# Patient Record
Sex: Female | Born: 2000 | Race: White | Hispanic: No | Marital: Married | Smoking: Never smoker
Health system: Southern US, Community
[De-identification: ages and names within clinical notes are randomized; demographics above are authoritative.]

## PROBLEM LIST (undated history)

## (undated) DIAGNOSIS — J45909 Unspecified asthma, uncomplicated: Secondary | ICD-10-CM

## (undated) DIAGNOSIS — K219 Gastro-esophageal reflux disease without esophagitis: Secondary | ICD-10-CM

## (undated) HISTORY — PX: TONSILLECTOMY: SUR1361

## (undated) HISTORY — PX: THYROIDECTOMY, PARTIAL: SHX18

## (undated) HISTORY — PX: TYMPANOSTOMY TUBE PLACEMENT: SHX32

## (undated) HISTORY — PX: COLPOSCOPY: SHX161

---
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
## 2018-09-24 ENCOUNTER — Emergency Department: Payer: Managed Care, Other (non HMO)

## 2018-09-24 ENCOUNTER — Emergency Department
Admission: EM | Admit: 2018-09-24 | Discharge: 2018-09-24 | Disposition: A | Discharge status: Home / Self Care | Payer: Managed Care, Other (non HMO) | Attending: Emergency Medicine | Admitting: Emergency Medicine

## 2018-09-24 ENCOUNTER — Other Ambulatory Visit: Payer: Self-pay

## 2018-09-24 ENCOUNTER — Encounter: Payer: Self-pay | Admitting: Emergency Medicine

## 2018-09-24 DIAGNOSIS — O200 Threatened abortion: Secondary | ICD-10-CM | POA: Diagnosis not present

## 2018-09-24 DIAGNOSIS — O26859 Spotting complicating pregnancy, unspecified trimester: Secondary | ICD-10-CM | POA: Diagnosis present

## 2018-09-24 DIAGNOSIS — Z79899 Other long term (current) drug therapy: Secondary | ICD-10-CM | POA: Diagnosis not present

## 2018-09-24 DIAGNOSIS — J45909 Unspecified asthma, uncomplicated: Secondary | ICD-10-CM | POA: Diagnosis not present

## 2018-09-24 DIAGNOSIS — Z3A01 Less than 8 weeks gestation of pregnancy: Secondary | ICD-10-CM | POA: Diagnosis not present

## 2018-09-24 DIAGNOSIS — O209 Hemorrhage in early pregnancy, unspecified: Secondary | ICD-10-CM

## 2018-09-24 LAB — COMPREHENSIVE METABOLIC PANEL
ALT: 17 U/L (ref 0–44)
AST: 19 U/L (ref 15–41)
Albumin: 4.6 g/dL (ref 3.5–5.0)
Alkaline Phosphatase: 41 U/L (ref 38–126)
Anion gap: 8 (ref 5–15)
BUN: 15 mg/dL (ref 6–20)
CO2: 25 mmol/L (ref 22–32)
Calcium: 9.6 mg/dL (ref 8.9–10.3)
Chloride: 106 mmol/L (ref 98–111)
Creatinine, Ser: 0.59 mg/dL (ref 0.44–1.00)
GFR calc Af Amer: >60 mL/min (ref >60)
GFR calc non Af Amer: >60 mL/min (ref >60)
Glucose, Bld: 99 mg/dL (ref 70–99)
Potassium: 3.7 mmol/L (ref 3.5–5.1)
Sodium: 139 mmol/L (ref 135–145)
Total Bilirubin: 0.6 mg/dL (ref 0.3–1.2)
Total Protein: 7.4 g/dL (ref 6.5–8.1)

## 2018-09-24 LAB — WET PREP, GENITAL
Clue Cells Wet Prep HPF POC: NONE SEEN
Sperm: NONE SEEN
Trich, Wet Prep: NONE SEEN
Yeast Wet Prep HPF POC: NONE SEEN

## 2018-09-24 LAB — URINALYSIS, COMPLETE (UACMP) WITH MICROSCOPIC
Bacteria, UA: NONE SEEN
Bilirubin Urine: NEGATIVE
Glucose, UA: NEGATIVE mg/dL
Ketones, ur: NEGATIVE mg/dL
Leukocytes,Ua: NEGATIVE
Nitrite: NEGATIVE
Protein, ur: 30 mg/dL — AB
Specific Gravity, Urine: 1.002 — ABNORMAL LOW (ref 1.005–1.030)
pH: 7 (ref 5.0–8.0)

## 2018-09-24 LAB — CBC WITH DIFFERENTIAL/PLATELET
Abs Immature Granulocytes: 0.02 10*3/uL (ref 0.00–0.07)
Basophils Absolute: 0 10*3/uL (ref 0.0–0.1)
Basophils Relative: 1 %
Eosinophils Absolute: 0.5 10*3/uL (ref 0.0–0.5)
Eosinophils Relative: 6 %
HCT: 42 % (ref 36.0–46.0)
Hemoglobin: 14.4 g/dL (ref 12.0–15.0)
Immature Granulocytes: 0 %
Lymphocytes Relative: 28 %
Lymphs Abs: 2.4 10*3/uL (ref 0.7–4.0)
MCH: 30.5 pg (ref 26.0–34.0)
MCHC: 34.3 g/dL (ref 30.0–36.0)
MCV: 89 fL (ref 80.0–100.0)
Monocytes Absolute: 0.6 10*3/uL (ref 0.1–1.0)
Monocytes Relative: 7 %
Neutro Abs: 5 10*3/uL (ref 1.7–7.7)
Neutrophils Relative %: 58 %
Platelets: 213 10*3/uL (ref 150–400)
RBC: 4.72 MIL/uL (ref 3.87–5.11)
RDW: 11.9 % (ref 11.5–15.5)
WBC: 8.5 10*3/uL (ref 4.0–10.5)
nRBC: 0 % (ref 0.0–0.2)

## 2018-09-24 LAB — CHLAMYDIA/NGC RT PCR (ARMC ONLY): N gonorrhoeae: NOT DETECTED

## 2018-09-24 LAB — CHLAMYDIA/NGC RT PCR (ARMC ONLY)??????????: Chlamydia Tr: NOT DETECTED

## 2018-09-24 LAB — HCG, QUANTITATIVE, PREGNANCY: hCG, Beta Chain, Quant, S: 15 m[IU]/mL — ABNORMAL HIGH (ref <5)

## 2018-09-24 LAB — ABO/RH: ABO/RH(D): A POS

## 2018-09-24 NOTE — ED Provider Notes (Addendum)
[facility] Emergency Department Provider Note   ____________________________________________   First MD Initiated Contact with Patient 09/24/18 1138     (approximate)  I have reviewed the triage vital signs and the nursing notes.   HISTORY  Chief Complaint Vaginal Bleeding    HPI Sara Henson is a 18 y.o. female who reports she is about [redacted] weeks pregnant.  She says she is been having some cramping for the last 2 to 3 days.  Today she began having some bleeding.  Is not very heavy.  She came into the emergency room to be checked.  This is her first pregnancy.  Pain is moderate again crampy.  No other problems.         Past Medical History:  Diagnosis Date  . Asthma   . GERD (gastroesophageal reflux disease)     Patient Active Problem List   Diagnosis Date Noted  . Loss of weight 01/22/2013  . Disordered eating 01/22/2013    Past Surgical History:  Procedure Laterality Date  . TONSILLECTOMY      Prior to Admission medications   Medication Sig Start Date End Date Taking? Authorizing Provider  albuterol (PROVENTIL HFA;VENTOLIN HFA) 108 (90 Base) MCG/ACT inhaler Inhale into the lungs every 6 (six) hours as needed for wheezing or shortness of breath.   Yes [provider]  loratadine (CLARITIN) 10 MG tablet Take 10 mg by mouth daily.   Yes [provider]  prenatal vitamin w/FE, FA (PRENATAL 1 + 1) 27-1 MG TABS tablet Take 1 tablet by mouth daily at 12 noon.   Yes [provider]    Allergies Patient has no known allergies.  No family history on file.  Social History Social History   Tobacco Use  . Smoking status: Never Smoker  . Smokeless tobacco: Former Neurosurgeon    Types: Chew  Substance Use Topics  . Alcohol use: Yes    Frequency: Never    Comment: social  . Drug use: No    Review of Systems  Constitutional: No fever/chills Eyes: No visual changes. ENT: No sore throat. Cardiovascular: Denies chest  pain. Respiratory: Denies shortness of breath. Gastrointestinal: Crampy lower abdominal pain.  No nausea, no vomiting.  No diarrhea.  No constipation. Genitourinary: Negative for dysuria. Musculoskeletal: Negative for back pain. Skin: Negative for rash. Neurological: Negative for headaches, focal weakness  ____________________________________________   PHYSICAL EXAM:  VITAL SIGNS: ED Triage Vitals [09/24/18 1131]  Enc Vitals Group     BP 138/75     Pulse Rate 81     Resp 16     Temp 98.8 F (37.1 C)     Temp Source Oral     SpO2 99 %     Weight 155 lb (70.3 kg)     Height 5\' 7"  (1.702 m)     Head Circumference      Peak Flow      Pain Score 3     Pain Loc      Pain Edu?      Excl. in GC?     Constitutional: Alert and oriented. Well appearing and in no acute distress although she initially was tearful. Eyes: Conjunctivae are normal.  Head: Atraumatic. Nose: No congestion/rhinnorhea. Mouth/Throat: Mucous membranes are moist.  Oropharynx non-erythematous. Neck: No stridor.  Cardiovascular: Normal rate, regular rhythm. Grossly normal heart sounds.  Good peripheral circulation. Respiratory: Normal respiratory effort.  No retractions. Lungs CTAB. Gastrointestinal: Soft only tender suprapubically r. No distention.  No abdominal bruits. No CVA tenderness. Genitourinary: Normal perineum and vagina.  There is some blood in the vagina but this is easily absorbed with a fox swabs.  Cervix is closed occasionally puts out less than a cc of blood.  Blood is dark.  There is no other abnormality that I can see any vagina.  No cervical motion tenderness or adnexal masses palpable. Musculoskeletal: No lower extremity tenderness nor edema.  Neurologic:  Normal speech and language. No gross focal neurologic deficits are appreciated. No gait instability. Skin:  Skin is warm, dry and intact. No rash noted.   ____________________________________________   LABS (all labs ordered are listed,  but only abnormal results are displayed)  Labs Reviewed  WET PREP, GENITAL - Abnormal; Notable for the following components:      Result Value   WBC, Wet Prep HPF POC FEW (*)    All other components within normal limits  URINALYSIS, COMPLETE (UACMP) WITH MICROSCOPIC - Abnormal; Notable for the following components:   Color, Urine YELLOW (*)    APPearance CLEAR (*)    Specific Gravity, Urine 1.002 (*)    Hgb urine dipstick LARGE (*)    Protein, ur 30 (*)    All other components within normal limits  HCG, QUANTITATIVE, PREGNANCY - Abnormal; Notable for the following components:   hCG, Beta Chain, Quant, S 15 (*)    All other components within normal limits  CHLAMYDIA/NGC RT PCR ([facility] ONLY)  CBC WITH DIFFERENTIAL/PLATELET  COMPREHENSIVE METABOLIC PANEL  ABO/RH   ____________________________________________  EKG   ____________________________________________  RADIOLOGY  ED MD interpretation: Percent read by radiology reviewed by me does not show anything in the uterus.  Official radiology report(s): US Ob Less Than 14 Weeks With Ob Transvaginal  Result Date: 09/24/2018 CLINICAL DATA:  Pelvic cramping and vaginal bleeding. First trimester pregnancy. LMP 08/21/2018. Beta HCG level 15. EXAM: OBSTETRIC <14 WK [place] AND TRANSVAGINAL OB US TECHNIQUE: Both transabdominal and transvaginal ultrasound examinations were performed for complete evaluation of the gestation as well as the maternal uterus, adnexal regions, and pelvic cul-de-sac. Transvaginal technique was performed to assess early pregnancy. COMPARISON:  None. FINDINGS: Intrauterine gestational sac: None visualized. Yolk sac:  Not visualized. Embryo:  Not visualized. Cardiac Activity: Not visualized. Subchorionic hemorrhage: Mild endometrial thickening without focal hematoma. Maternal uterus/adnexae: Both ovaries are visualized. There is no adnexal mass. A small amount of free pelvic fluid is present. IMPRESSION: No  intrauterine gestational sac, yolk sac, fetal pole, or cardiac activity visualized. Differential considerations include intrauterine gestation too early to be sonographically visualized, spontaneous abortion, or ectopic pregnancy. Consider follow-up ultrasound in 14 days and serial quantitative beta HCG follow-up. Electronically Signed   By: Carey Bullocks M.D.   On: 09/24/2018 14:02    ____________________________________________   PROCEDURES  Procedure(s) performed (including Critical Care):  Procedures   ____________________________________________   INITIAL IMPRESSION / ASSESSMENT AND PLAN / ED COURSE  Patient is a positive.  Sharene Butters is only 13.  Ultrasound is negative.  This could mean that the patient has a very early pregnancy or the charting miscarried over that she has an ectopic.  I discussed her with Dr. Annamarie Major on-call for [facility].  He will see her on Tuesday.  She will call for an appointment.  She will return if there is any increasing in pain or bleeding.  For now I will diagnose her with threatened miscarriage although any 1 of the 3 possibilities I mention is accurate.    Alannie  L Edwards was evaluated in Emergency Department on 09/24/2018 for the symptoms described in the history of present illness. She was evaluated in the context of the global COVID-19 pandemic, which necessitated consideration that the patient might be at risk for infection with the SARS-CoV-2 virus that causes COVID-19. Institutional protocols and algorithms that pertain to the evaluation of patients at risk for COVID-19 are in a state of rapid change based on information released by regulatory bodies including the CDC and federal and state organizations. These policies and algorithms were followed during the patient's care in the ED.         ____________________________________________   FINAL CLINICAL IMPRESSION(S) / ED DIAGNOSES  Final diagnoses:  Threatened miscarriage     ED  Discharge Orders    None       Note:  This document was prepared using Dragon voice recognition software and may include unintentional dictation errors.    Arnaldo NatalMalinda, Augustina Braddock F, MD 09/24/18 1537    Arnaldo NatalMalinda, Elynor Kallenberger F, MD 09/24/18 1537

## 2018-09-24 NOTE — Discharge Instructions (Addendum)
The ultrasound did not show anything.  This could be because the pregnancy is very early and everything is too small to see or it could be that you may have a pregnancy somewhere besides the uterus or it could be that you already miscarried.  A pregnancy outside of the uterus can be dangerous if it gets bigger.  I talked to Dr. Tiburcio Pea on call for [facility].  He wants you to call them Tuesday morning and make an appointment and come into the office.  They will recheck you on Tuesday.  If you have worse pain or heavier bleeding please return to the emergency room even if it is 3:00 in the morning.  If you cannot get a ride please call the ambulance.

## 2018-09-24 NOTE — ED Notes (Signed)
Pt returned from [place]. Awaiting pelvic exam. EDP informed pt was back.

## 2018-09-24 NOTE — ED Notes (Signed)
Pt in [place]. Will perform pelvic exam when pt returns. MD aware.

## 2018-09-24 NOTE — ED Notes (Signed)
Pt states she had an allergic reaction to something yesterday at work with hives and swelling in her eyes- pt took one benadryl- states she called her pharmacy and they said that was okay

## 2018-09-24 NOTE — ED Notes (Signed)
Pt attempted and was unable to provide urine sample at this time 

## 2018-09-24 NOTE — ED Notes (Signed)
Pt having vaginal bleeding with first pregnancy- pt is about [redacted] weeks pregnant- pt is worried and tearful- NAD noted at this time

## 2018-09-24 NOTE — ED Triage Notes (Signed)
Pt to ED via POV c/o vaginal bleeding. Pt states that she is approximately [redacted] weeks pregnant. Pt states that she is also having abdominal cramping. Pt is tearful in triage.

## 2018-09-26 ENCOUNTER — Other Ambulatory Visit: Payer: Self-pay | Admitting: Obstetrics and Gynecology

## 2018-09-26 ENCOUNTER — Other Ambulatory Visit: Payer: Managed Care, Other (non HMO)

## 2018-09-26 ENCOUNTER — Other Ambulatory Visit: Payer: Self-pay

## 2018-09-26 DIAGNOSIS — O3680X0 Pregnancy with inconclusive fetal viability, not applicable or unspecified: Secondary | ICD-10-CM

## 2018-09-27 ENCOUNTER — Other Ambulatory Visit: Payer: Self-pay

## 2018-09-27 ENCOUNTER — Other Ambulatory Visit: Payer: Managed Care, Other (non HMO)

## 2018-09-27 ENCOUNTER — Encounter: Payer: Self-pay | Admitting: Obstetrics and Gynecology

## 2018-09-27 ENCOUNTER — Ambulatory Visit (INDEPENDENT_AMBULATORY_CARE_PROVIDER_SITE_OTHER): Payer: Managed Care, Other (non HMO) | Admitting: Obstetrics and Gynecology

## 2018-09-27 VITALS — BP 118/62 | HR 77 | Ht 68.0 in | Wt 161.0 lb

## 2018-09-27 DIAGNOSIS — O0281 Inappropriate change in quantitative human chorionic gonadotropin (hCG) in early pregnancy: Secondary | ICD-10-CM | POA: Diagnosis not present

## 2018-09-27 LAB — BETA HCG QUANT (REF LAB): hCG Quant: 4 m[IU]/mL

## 2018-09-27 NOTE — Progress Notes (Signed)
Obstetric Problem Visit    Chief Complaint:  Chief Complaint  Patient presents with   ER follow up    First trimester bleeding/Labwork    History of Present Illness: Patient is a 18 y.o. G1P0000 [redacted]w[redacted]d presenting for first trimester bleeding.  The onset of bleeding was 09/24/2018.  Is bleeding equal to or greater than normal menstrual flow:  Yes Any recent trauma:  No Recent intercourse:  No History of prior miscarriage:  No Prior ultrasound demonstrating IUP: none.  Prior ultrasound demonstrating viable IUP:  No Prior Serum HCG:  Yes 15 mIU/mL on 09/24/2018 and 69mIU/mL on 09/26/2018 Rh status: positive  Bleeding has subsided and is relatively light at present.  No abdominal pain  Review of Systems: ROS  Past Medical History:  Past Medical History:  Diagnosis Date   Asthma    GERD (gastroesophageal reflux disease)     Past Surgical History:  Past Surgical History:  Procedure Laterality Date   TONSILLECTOMY      Obstetric History: G1P0000  Family History:  History reviewed. No pertinent family history.  Social History:  Social History   Socioeconomic History   Marital status: Single    Spouse name: Not on file   Number of children: Not on file   Years of education: Not on file   Highest education level: Not on file  Occupational History   Not on file  Social Needs   Financial resource strain: Not on file   Food insecurity:    Worry: Not on file    Inability: Not on file   Transportation needs:    Medical: Not on file    Non-medical: Not on file  Tobacco Use   Smoking status: Never Smoker   Smokeless tobacco: Former Neurosurgeon    Types: Chew  Substance and Sexual Activity   Alcohol use: Yes    Frequency: Never    Comment: social   Drug use: No   Sexual activity: Yes    Birth control/protection: None  Lifestyle   Physical activity:    Days per week: Not on file    Minutes per session: Not on file   Stress: Not on file    Relationships   Social connections:    Talks on phone: Not on file    Gets together: Not on file    Attends religious service: Not on file    Active member of club or organization: Not on file    Attends meetings of clubs or organizations: Not on file    Relationship status: Not on file   Intimate partner violence:    Fear of current or ex partner: Not on file    Emotionally abused: Not on file    Physically abused: Not on file    Forced sexual activity: Not on file  Other Topics Concern   Not on file  Social History Narrative   Not on file    Allergies:  No Known Allergies  Medications: Prior to Admission medications   Medication Sig Start Date End Date Taking? Authorizing Provider  albuterol (PROVENTIL HFA;VENTOLIN HFA) 108 (90 Base) MCG/ACT inhaler Inhale into the lungs every 6 (six) hours as needed for wheezing or shortness of breath.   Yes [provider]  loratadine (CLARITIN) 10 MG tablet Take 10 mg by mouth daily.   Yes [provider]  prenatal vitamin w/FE, FA (PRENATAL 1 + 1) 27-1 MG TABS tablet Take 1 tablet by mouth daily at 12 noon.   Yes  [provider]    Physical Exam Vitals: Blood pressure 118/62, pulse 77, height 5\' 8"  (1.727 m), weight 161 lb (73 kg), last menstrual period 08/21/2018. General: NAD, well nourished, appears stated age HEENT: normocephalic, anicteric Pulmonary: No increased work of breathing, Neurologic: Grossly intact Psychiatric: mood appropriate, affect full  Assessment: 18 y.o. G1P0000 2357w2d presenting for evaluation of first trimester vaginal bleeding  Plan: Problem List Items Addressed This Visit    None    Visit Diagnoses    Chemical pregnancy    -  Primary      1)   I stressed that while emotionally difficult, that this did not occur because of an actions or inactions by the patient.  Somewhere between 10-20% of identified first trimester pregnancies will unfortunately end in miscarriage.  Given  this relatively high incidence rate, further diagnostic testing such as chromosome analysis is generally not clinically relevant nor recommended.  Although the chromosomal abnormalities have been implicated at rates as high as 70% in some studies, these are generally random and do not infer and increased risk of recurrence with subsequent pregnancies.  However, 3 or more consecutive first trimester losses are relatively uncommon, and these patient generally do benefit from additional work up to determine a potential modifiable etiology.   We briefly discussed management options including expectant management, medical management, and surgical management as well as their relative success rates and complications. Approximately 80% of first trimester miscarriages will pass successfully but may require a time frame of up to 8 weeks (ACOG Practice Bulletin 150 May 2015 "Early Pregnancy Loss").  Medical management using 800mcg of misoprostil administered every 3-hrs as needed for up to 3 doses speeds up the time frame to completion significantly, has literature supporting its use up to 63 days or 3953w0d gestation and results in a passage rate of 84-85% (ACOG Practice Bulletin 143 March 2014 "Medical Management of First-Trimester Abortion").  Dilation and curettage has the highest rate of uterine evacuation, but carries with is operative cost, surgical and anesthetic risk.  While these risk are relatively small they nevertheless include infection, bleeding, uterine perforation, formation of uterine synechia, and in rare cases death.  Based on initial HCG trend, LMP reported, suspected chemical pregnancy.The patient is Rh postive and rhogam is therefore not indicated.    2) Not currently trying to conceive, declines contraception  3) Return in about 1 year (around 09/27/2019) for annual.     Vena AustriaAndreas Titania Gault, MD, Merlinda FrederickFACOG [facility], [facility] 09/27/2018, 11:15 AM

## 2018-10-04 ENCOUNTER — Encounter: Payer: Managed Care, Other (non HMO) | Admitting: Maternal Newborn

## 2019-01-22 ENCOUNTER — Telehealth: Payer: Self-pay | Admitting: Obstetrics and Gynecology

## 2019-01-22 ENCOUNTER — Ambulatory Visit: Payer: Self-pay

## 2019-01-22 ENCOUNTER — Other Ambulatory Visit: Payer: Self-pay

## 2019-01-22 DIAGNOSIS — R300 Dysuria: Secondary | ICD-10-CM

## 2019-01-22 DIAGNOSIS — O26899 Other specified pregnancy related conditions, unspecified trimester: Secondary | ICD-10-CM

## 2019-01-22 NOTE — Telephone Encounter (Signed)
Patient is calling with Uti symptoms. Patient is schedule 01/29/19 with NOB appointment. Please advise urine drop off.

## 2019-01-22 NOTE — Telephone Encounter (Signed)
Patient is schedule 01/22/19 at 2 pm for UA Drop off

## 2019-01-22 NOTE — Telephone Encounter (Signed)
Nurse schedule for urine drop off per AMS

## 2019-01-23 ENCOUNTER — Telehealth: Payer: Self-pay | Admitting: Obstetrics and Gynecology

## 2019-01-23 NOTE — Telephone Encounter (Signed)
Pt aware, via voicemail, results are not back and it takes 48 hours to return.

## 2019-01-23 NOTE — Telephone Encounter (Signed)
Patient is calling to find out her UA results. Please advise

## 2019-01-24 ENCOUNTER — Telehealth: Payer: Self-pay

## 2019-01-24 LAB — URINE CULTURE

## 2019-01-24 NOTE — Telephone Encounter (Signed)
Pt calling about UC results. Pt aware of results. Pt will increase water intake and let us know Monday at her appt if issue is not better

## 2019-01-29 ENCOUNTER — Other Ambulatory Visit: Payer: Self-pay

## 2019-01-29 ENCOUNTER — Encounter: Payer: Self-pay | Admitting: Obstetrics and Gynecology

## 2019-01-29 ENCOUNTER — Other Ambulatory Visit (HOSPITAL_COMMUNITY)
Admission: RE | Admit: 2019-01-29 | Discharge: 2019-01-29 | Disposition: A | Discharge status: Home / Self Care | Payer: Managed Care, Other (non HMO) | Source: Ambulatory Visit | Attending: Obstetrics and Gynecology | Admitting: Obstetrics and Gynecology

## 2019-01-29 ENCOUNTER — Ambulatory Visit (INDEPENDENT_AMBULATORY_CARE_PROVIDER_SITE_OTHER): Payer: Managed Care, Other (non HMO) | Admitting: Obstetrics and Gynecology

## 2019-01-29 ENCOUNTER — Other Ambulatory Visit: Payer: Self-pay | Admitting: Obstetrics and Gynecology

## 2019-01-29 VITALS — BP 118/70 | Wt 166.0 lb

## 2019-01-29 DIAGNOSIS — O26891 Other specified pregnancy related conditions, first trimester: Secondary | ICD-10-CM

## 2019-01-29 DIAGNOSIS — Z3A01 Less than 8 weeks gestation of pregnancy: Secondary | ICD-10-CM

## 2019-01-29 DIAGNOSIS — Z3401 Encounter for supervision of normal first pregnancy, first trimester: Secondary | ICD-10-CM

## 2019-01-29 DIAGNOSIS — Z113 Encounter for screening for infections with a predominantly sexual mode of transmission: Secondary | ICD-10-CM | POA: Insufficient documentation

## 2019-01-29 DIAGNOSIS — R300 Dysuria: Secondary | ICD-10-CM

## 2019-01-29 DIAGNOSIS — Z3480 Encounter for supervision of other normal pregnancy, unspecified trimester: Secondary | ICD-10-CM

## 2019-01-29 DIAGNOSIS — O26899 Other specified pregnancy related conditions, unspecified trimester: Secondary | ICD-10-CM

## 2019-01-29 NOTE — Progress Notes (Signed)
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

## 2019-01-29 NOTE — Patient Instructions (Signed)
First Trimester of Pregnancy The first trimester of pregnancy is from week 1 until the end of week 13 (months 1 through 3). A week after a sperm fertilizes an egg, the egg will implant on the wall of the uterus. This embryo will begin to develop into a baby. Genes from you and your partner will form the baby. The female genes will determine whether the baby will be a boy or a girl. At 6-8 weeks, the eyes and face will be formed, and the heartbeat can be seen on ultrasound. At the end of 12 weeks, all the baby's organs will be formed. Now that you are pregnant, you will want to do everything you can to have a healthy baby. Two of the most important things are to get good prenatal care and to follow your health care provider's instructions. Prenatal care is all the medical care you receive before the baby's birth. This care will help prevent, find, and treat any problems during the pregnancy and childbirth. Body changes during your first trimester Your body goes through many changes during pregnancy. The changes vary from woman to woman.  You may gain or lose a couple of pounds at first.  You may feel sick to your stomach (nauseous) and you may throw up (vomit). If the vomiting is uncontrollable, call your health care provider.  You may tire easily.  You may develop headaches that can be relieved by medicines. All medicines should be approved by your health care provider.  You may urinate more often. Painful urination may mean you have a bladder infection.  You may develop heartburn as a result of your pregnancy.  You may develop constipation because certain hormones are causing the muscles that push stool through your intestines to slow down.  You may develop hemorrhoids or swollen veins (varicose veins).  Your breasts may begin to grow larger and become tender. Your nipples may stick out more, and the tissue that surrounds them (areola) may become darker.  Your gums may bleed and may be  sensitive to brushing and flossing.  Dark spots or blotches (chloasma, mask of pregnancy) may develop on your face. This will likely fade after the baby is born.  Your menstrual periods will stop.  You may have a loss of appetite.  You may develop cravings for certain kinds of food.  You may have changes in your emotions from day to day, such as being excited to be pregnant or being concerned that something may go wrong with the pregnancy and baby.  You may have more vivid and strange dreams.  You may have changes in your hair. These can include thickening of your hair, rapid growth, and changes in texture. Some women also have hair loss during or after pregnancy, or hair that feels dry or thin. Your hair will most likely return to normal after your baby is born. What to expect at prenatal visits During a routine prenatal visit:  You will be weighed to make sure you and the baby are growing normally.  Your blood pressure will be taken.  Your abdomen will be measured to track your baby's growth.  The fetal heartbeat will be listened to between weeks 10 and 14 of your pregnancy.  Test results from any previous visits will be discussed. Your health care provider may ask you:  How you are feeling.  If you are feeling the baby move.  If you have had any abnormal symptoms, such as leaking fluid, bleeding, severe headaches, or abdominal   cramping.  If you are using any tobacco products, including cigarettes, chewing tobacco, and electronic cigarettes.  If you have any questions. Other tests that may be performed during your first trimester include:  Blood tests to find your blood type and to check for the presence of any previous infections. The tests will also be used to check for low iron levels (anemia) and protein on red blood cells (Rh antibodies). Depending on your risk factors, or if you previously had diabetes during pregnancy, you may have tests to check for high blood sugar  that affects pregnant women (gestational diabetes).  Urine tests to check for infections, diabetes, or protein in the urine.  An ultrasound to confirm the proper growth and development of the baby.  Fetal screens for spinal cord problems (spina bifida) and Down syndrome.  HIV (human immunodeficiency virus) testing. Routine prenatal testing includes screening for HIV, unless you choose not to have this test.  You may need other tests to make sure you and the baby are doing well. Follow these instructions at home: Medicines  Follow your health care provider's instructions regarding medicine use. Specific medicines may be either safe or unsafe to take during pregnancy.  Take a prenatal vitamin that contains at least 600 micrograms (mcg) of folic acid.  If you develop constipation, try taking a stool softener if your health care provider approves. Eating and drinking   Eat a balanced diet that includes fresh fruits and vegetables, whole grains, good sources of protein such as meat, eggs, or tofu, and low-fat dairy. Your health care provider will help you determine the amount of weight gain that is right for you.  Avoid raw meat and uncooked cheese. These carry germs that can cause birth defects in the baby.  Eating four or five small meals rather than three large meals a day may help relieve nausea and vomiting. If you start to feel nauseous, eating a few soda crackers can be helpful. Drinking liquids between meals, instead of during meals, also seems to help ease nausea and vomiting.  Limit foods that are high in fat and processed sugars, such as fried and sweet foods.  To prevent constipation: ? Eat foods that are high in fiber, such as fresh fruits and vegetables, whole grains, and beans. ? Drink enough fluid to keep your urine clear or pale yellow. Activity  Exercise only as directed by your health care provider. Most women can continue their usual exercise routine during  pregnancy. Try to exercise for 30 minutes at least 5 days a week. Exercising will help you: ? Control your weight. ? Stay in shape. ? Be prepared for labor and delivery.  Experiencing pain or cramping in the lower abdomen or lower back is a good sign that you should stop exercising. Check with your health care provider before continuing with normal exercises.  Try to avoid standing for long periods of time. Move your legs often if you must stand in one place for a long time.  Avoid heavy lifting.  Wear low-heeled shoes and practice good posture.  You may continue to have sex unless your health care provider tells you not to. Relieving pain and discomfort  Wear a good support bra to relieve breast tenderness.  Take warm sitz baths to soothe any pain or discomfort caused by hemorrhoids. Use hemorrhoid cream if your health care provider approves.  Rest with your legs elevated if you have leg cramps or low back pain.  If you develop varicose veins in   your legs, wear support hose. Elevate your feet for 15 minutes, 3-4 times a day. Limit salt in your diet. Prenatal care  Schedule your prenatal visits by the twelfth week of pregnancy. They are usually scheduled monthly at first, then more often in the last 2 months before delivery.  Write down your questions. Take them to your prenatal visits.  Keep all your prenatal visits as told by your health care provider. This is important. Safety  Wear your seat belt at all times when driving.  Make a list of emergency phone numbers, including numbers for family, friends, the hospital, and police and fire departments. General instructions  Ask your health care provider for a referral to a local prenatal education class. Begin classes no later than the beginning of month 6 of your pregnancy.  Ask for help if you have counseling or nutritional needs during pregnancy. Your health care provider can offer advice or refer you to specialists for help  with various needs.  Do not use hot tubs, steam rooms, or saunas.  Do not douche or use tampons or scented sanitary pads.  Do not cross your legs for long periods of time.  Avoid cat litter boxes and soil used by cats. These carry germs that can cause birth defects in the baby and possibly loss of the fetus by miscarriage or stillbirth.  Avoid all smoking, herbs, alcohol, and medicines not prescribed by your health care provider. Chemicals in these products affect the formation and growth of the baby.  Do not use any products that contain nicotine or tobacco, such as cigarettes and e-cigarettes. If you need help quitting, ask your health care provider. You may receive counseling support and other resources to help you quit.  Schedule a dentist appointment. At home, brush your teeth with a soft toothbrush and be gentle when you floss. Contact a health care provider if:  You have dizziness.  You have mild pelvic cramps, pelvic pressure, or nagging pain in the abdominal area.  You have persistent nausea, vomiting, or diarrhea.  You have a bad smelling vaginal discharge.  You have pain when you urinate.  You notice increased swelling in your face, hands, legs, or ankles.  You are exposed to fifth disease or chickenpox.  You are exposed to German measles (rubella) and have never had it. Get help right away if:  You have a fever.  You are leaking fluid from your vagina.  You have spotting or bleeding from your vagina.  You have severe abdominal cramping or pain.  You have rapid weight gain or loss.  You vomit blood or material that looks like coffee grounds.  You develop a severe headache.  You have shortness of breath.  You have any kind of trauma, such as from a fall or a car accident. Summary  The first trimester of pregnancy is from week 1 until the end of week 13 (months 1 through 3).  Your body goes through many changes during pregnancy. The changes vary from  woman to woman.  You will have routine prenatal visits. During those visits, your health care provider will examine you, discuss any test results you may have, and talk with you about how you are feeling. This information is not intended to replace advice given to you by your health care provider. Make sure you discuss any questions you have with your health care provider. Document Released: 04/13/2001 Document Revised: 04/01/2017 Document Reviewed: 03/31/2016 Elsevier Patient Education  2020 [employer] Inc[employer]  

## 2019-01-29 NOTE — Progress Notes (Signed)
01/29/2019   Chief Complaint: Missed period  Transfer of Care Patient: no  History of Present Illness: [name] is a 18 y.o. G2P0010 [redacted]w[redacted]d based on Patient's last menstrual period was 12/16/2018 (exact date). with an Estimated Date of Delivery: 09/22/19, with the above CC.   Her periods were: regular periods every 26-30 days She was using no method when she conceived.  She has Positive signs or symptoms of nausea/vomiting of pregnancy. She reports nausea. She denies vomiting.  She has Negative signs or symptoms of miscarriage or preterm labor She was not taking different medications around the time she  conceived/early pregnancy. Since her LMP, she has not used alcohol Since her LMP, she has not used tobacco products. She was using chew tobacco which she stopped in March of 2020.  Since her LMP, she has not used illegal drugs.    Current or past history of domestic violence. no  Infection History:  1. Since her LMP, she has not had a viral illness.  2. She denies close contact with children on a regular basis     3. She denies a history of chicken pox. She reports vaccination for chicken pox in the past. 4. Patient or partner has history of genital herpes  no 5. History of STI (GC, CT, HPV, syphilis, HIV)  no   6.  She does not live with someone with TB or TB exposed. 7. History of recent travel :  no 8. She identifies Negative Zika risk factors for her and her partner 65. There are not cats in the home in the home.  She understands that while pregnant she should not change cat litter.   Genetic Screening Questions: (Includes patient, baby's father, or anyone in either family)   1. Patient's age >/= 53 at [facility]  no 2. Thalassemia ([place], [place], Mediterranean, or Asian background): MCV<80  no 3. Neural tube defect (meningomyelocele, spina bifida, anencephaly)  no 4. Congenital heart defect  no  5. Down syndrome  no 6. Tay-Sachs (Jewish, [place]  no 7. Canavan's  Disease  no 8. Sickle cell disease or trait (African)  no  9. Hemophilia or other blood disorders  no  10. Muscular dystrophy  no  11. Cystic fibrosis  Reports that her sister is a CF carrier  18. Huntington's Chorea  no  13. Mental retardation/autism  no 14. Other inherited genetic or chromosomal disorder  no 15. Maternal metabolic disorder (DM, PKU, etc)  no 16. Patient or FOB with a child with a birth defect not listed above no  16a. Patient or FOB with a birth defect themselves no 17. Recurrent pregnancy loss, or stillbirth  no  18. Any medications since LMP other than prenatal vitamins (include vitamins, supplements, OTC meds, drugs, alcohol) : OTC claritin 19. Any other genetic/environmental exposure to discuss  no  ROS:  ROS  OBGYN History: As per HPI. OB History  Gravida Para Term Preterm AB Living  2 0 0   1 0  SAB TAB Ectopic Multiple Live Births  1       0    # Outcome Date GA Lbr Len/2nd Weight Sex Delivery Anes PTL Lv  2 Current           1 SAB 09/24/18            Any issues with any prior pregnancies: no Any prior children are healthy, doing well, without any problems or issues: not applicable Last pap smear under 21 History of STIs:  No   Past Medical History: Past Medical History:  Diagnosis Date  . Asthma   . GERD (gastroesophageal reflux disease)     Past Surgical History: Past Surgical History:  Procedure Laterality Date  . TONSILLECTOMY    . TYMPANOSTOMY TUBE PLACEMENT      Family History:  Family History  Problem Relation Age of Onset  . Skin cancer Father    She denies any female cancers, bleeding or blood clotting disorders.   Social History:  Social History   Socioeconomic History  . Marital status: Single    Spouse name: Not on file  . Number of children: Not on file  . Years of education: Not on file  . Highest education level: Not on file  Occupational History  . Not on file  Social Needs  . Financial resource strain: Not  on file  . Food insecurity    Worry: Not on file    Inability: Not on file  . Transportation needs    Medical: Not on file    Non-medical: Not on file  Tobacco Use  . Smoking status: Never Smoker  . Smokeless tobacco: Former Systems developer    Types: Chew  Substance and Sexual Activity  . Alcohol use: Yes    Frequency: Never    Comment: social  . Drug use: No  . Sexual activity: Yes    Birth control/protection: None  Lifestyle  . Physical activity    Days per week: Not on file    Minutes per session: Not on file  . Stress: Not on file  Relationships  . Social Herbalist on phone: Not on file    Gets together: Not on file    Attends religious service: Not on file    Active member of club or organization: Not on file    Attends meetings of clubs or organizations: Not on file    Relationship status: Not on file  . Intimate partner violence    Fear of current or ex partner: Not on file    Emotionally abused: Not on file    Physically abused: Not on file    Forced sexual activity: Not on file  Other Topics Concern  . Not on file  Social History Narrative  . Not on file    Allergy: No Known Allergies  Current Outpatient Medications:  Current Outpatient Medications:  .  loratadine (CLARITIN) 10 MG tablet, Take 10 mg by mouth daily., Disp: , Rfl:  .  prenatal vitamin w/FE, FA (PRENATAL 1 + 1) 27-1 MG TABS tablet, Take 1 tablet by mouth daily at 12 noon., Disp: , Rfl:  .  albuterol (PROVENTIL HFA;VENTOLIN HFA) 108 (90 Base) MCG/ACT inhaler, Inhale into the lungs every 6 (six) hours as needed for wheezing or shortness of breath., Disp: , Rfl:    Physical Exam: Physical Exam  Constitutional: She is oriented to person, place, and time. She appears well-developed and well-nourished.  HENT:  Head: Normocephalic and atraumatic.  Eyes: EOM are normal.  Cardiovascular: Normal rate, regular rhythm and normal heart sounds.  Pulmonary/Chest: Effort normal and breath sounds  normal.  Genitourinary:    Genitourinary Comments: External: Normal appearing vulva. No lesions noted.  No blood in the vaginal vault. no discharge.   Bimanual examination: Uterus midline, non-tender, normal in size, shape and contour.  No CMT. No adnexal masses. No adnexal tenderness. Pelvis not fixed.      Neurological: She is alert and oriented to person, place, and  time.  Skin: Skin is warm and dry.  Psychiatric: She has a normal mood and affect. Her behavior is normal. Judgment and thought content normal.  Nursing note and vitals reviewed.    Assessment: [name] is a 18 y.o. G2P0010 5926w2d based on Patient's last menstrual period was 12/16/2018 (exact date). with an Estimated Date of Delivery: 09/22/19,  for prenatal care.  Plan:  1) Avoid alcoholic beverages. 2) Patient encouraged not to smoke.  3) Discontinue the use of all non-medicinal drugs and chemicals.  4) Take prenatal vitamins daily.  5) Seatbelt use advised 6) Nutrition, food safety (fish, cheese advisories, and high nitrite foods) and exercise discussed. 7) Hospital and practice style [facility] discussed  8) Patient is asked about travel to areas at risk for the Zika virus, and counseled to avoid travel and exposure to mosquitoes or sexual partners who may have themselves been exposed to the virus. Testing is discussed, and will be ordered as appropriate.  9) Childbirth classes at [facility] advised 10) Genetic Screening, such as with 1st Trimester Screening, cell free fetal DNA, AFP testing, and Ultrasound, as well as with amniocentesis and CVS as appropriate, is discussed with patient. She plans to have genetic testing this pregnancy.  Will return in 1 week for dating US.  Problem list reviewed and updated.  I discussed the assessment and treatment plan with the patient. The patient was provided an opportunity to ask questions and all were answered. The patient agreed with the plan and demonstrated an  understanding of the instructions.  Adelene Idlerhristanna Schuman MD [facility], [facility] 01/29/2019 1:54 PM

## 2019-01-30 ENCOUNTER — Other Ambulatory Visit: Payer: Self-pay | Admitting: Obstetrics and Gynecology

## 2019-01-30 DIAGNOSIS — Z113 Encounter for screening for infections with a predominantly sexual mode of transmission: Secondary | ICD-10-CM

## 2019-01-30 DIAGNOSIS — Z3480 Encounter for supervision of other normal pregnancy, unspecified trimester: Secondary | ICD-10-CM | POA: Insufficient documentation

## 2019-01-30 LAB — RPR+RH+ABO+RUB AB+AB SCR+CB...
Antibody Screen: NEGATIVE
HIV Screen 4th Generation wRfx: NONREACTIVE
Hematocrit: 40.1 % (ref 34.0–46.6)
Hemoglobin: 13.4 g/dL (ref 11.1–15.9)
Hepatitis B Surface Ag: NEGATIVE
MCH: 30.1 pg (ref 26.6–33.0)
MCHC: 33.4 g/dL (ref 31.5–35.7)
MCV: 90 fL (ref 79–97)
Platelets: 215 10*3/uL (ref 150–450)
RBC: 4.45 x10E6/uL (ref 3.77–5.28)
RDW: 11.8 % (ref 11.7–15.4)
RPR Ser Ql: NONREACTIVE
Rh Factor: POSITIVE
Rubella Antibodies, IGG: 6.67 index (ref >0.99)
Varicella zoster IgG: 2658 index (ref >165)
WBC: 8.2 10*3/uL (ref 3.4–10.8)

## 2019-01-30 LAB — MONITOR DRUG PROFILE 10(MW)
Amphetamine Scrn, Ur: NEGATIVE ng/mL
BARBITURATE SCREEN URINE: NEGATIVE ng/mL
BENZODIAZEPINE SCREEN, URINE: NEGATIVE ng/mL
CANNABINOIDS UR QL SCN: NEGATIVE ng/mL
Cocaine (Metab) Scrn, Ur: NEGATIVE ng/mL
Creatinine(Crt), U: 29.5 mg/dL (ref 20.0–300.0)
Methadone Screen, Urine: NEGATIVE ng/mL
OXYCODONE+OXYMORPHONE UR QL SCN: NEGATIVE ng/mL
Opiate Scrn, Ur: NEGATIVE ng/mL
Ph of Urine: 6.6 (ref 4.5–8.9)
Phencyclidine Qn, Ur: NEGATIVE ng/mL
Propoxyphene Scrn, Ur: NEGATIVE ng/mL

## 2019-01-31 NOTE — Progress Notes (Signed)
WNL, released to mychart.

## 2019-02-01 ENCOUNTER — Encounter: Payer: Managed Care, Other (non HMO) | Admitting: Obstetrics and Gynecology

## 2019-02-01 LAB — CERVICOVAGINAL ANCILLARY ONLY
Chlamydia: NEGATIVE
Molecular Disclaimer: NEGATIVE
Molecular Disclaimer: NORMAL
Neisseria Gonorrhea: NEGATIVE

## 2019-02-01 LAB — URINE CULTURE

## 2019-02-06 ENCOUNTER — Telehealth: Payer: Self-pay

## 2019-02-06 NOTE — Telephone Encounter (Signed)
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  Should she go home or continue working?  Is this normal?  What are the risks?  [phone]  Pt was at home when I called her.  Adv no heavy lifting; if pain to stop activity; if strenuous activity continues it may cause miscarriage.  To discuss further at appt tomorrow.  Pt asked if u/s could be videoed.  Adv no per our ins.  She may get some pictures tomorrow.  To take warm bath or shower and tylenol.

## 2019-02-07 ENCOUNTER — Other Ambulatory Visit: Payer: Self-pay

## 2019-02-07 ENCOUNTER — Ambulatory Visit (INDEPENDENT_AMBULATORY_CARE_PROVIDER_SITE_OTHER): Payer: Managed Care, Other (non HMO) | Admitting: Advanced Practice Midwife

## 2019-02-07 ENCOUNTER — Ambulatory Visit (INDEPENDENT_AMBULATORY_CARE_PROVIDER_SITE_OTHER): Payer: Managed Care, Other (non HMO)

## 2019-02-07 ENCOUNTER — Other Ambulatory Visit: Payer: Self-pay | Admitting: Obstetrics and Gynecology

## 2019-02-07 ENCOUNTER — Encounter: Payer: Self-pay | Admitting: Advanced Practice Midwife

## 2019-02-07 VITALS — BP 122/74 | Wt 167.0 lb

## 2019-02-07 DIAGNOSIS — Z3481 Encounter for supervision of other normal pregnancy, first trimester: Secondary | ICD-10-CM

## 2019-02-07 DIAGNOSIS — Z3A08 8 weeks gestation of pregnancy: Secondary | ICD-10-CM

## 2019-02-07 DIAGNOSIS — Z3A01 Less than 8 weeks gestation of pregnancy: Secondary | ICD-10-CM

## 2019-02-07 DIAGNOSIS — O2340 Unspecified infection of urinary tract in pregnancy, unspecified trimester: Secondary | ICD-10-CM

## 2019-02-07 DIAGNOSIS — O3481 Maternal care for other abnormalities of pelvic organs, first trimester: Secondary | ICD-10-CM | POA: Diagnosis not present

## 2019-02-07 DIAGNOSIS — N8311 Corpus luteum cyst of right ovary: Secondary | ICD-10-CM

## 2019-02-07 DIAGNOSIS — Z3401 Encounter for supervision of normal first pregnancy, first trimester: Secondary | ICD-10-CM

## 2019-02-07 DIAGNOSIS — Z3480 Encounter for supervision of other normal pregnancy, unspecified trimester: Secondary | ICD-10-CM

## 2019-02-07 MED ORDER — CEPHALEXIN 500 MG PO CAPS: 500.0000 mg | ORAL_CAPSULE | Freq: Two times a day (BID) | ORAL | 0 refills | Status: AC

## 2019-02-07 NOTE — Progress Notes (Signed)
Routine Prenatal Care Visit  Subjective  Sara Henson is a 18 y.o. G2P0010 at [redacted]w[redacted]d being seen today for ongoing prenatal care.  She is currently monitored for the following issues for this low-risk pregnancy and has Loss of weight; Disordered eating; and Supervision of other normal pregnancy, antepartum on their problem list.  ----------------------------------------------------------------------------------- Patient reports some lower bilateral abdominal pains recently at work. 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 She denies bleeding and the pain resolved quickly. We discussed farming precautions- gloves when working in soil or around manure, lifting as able, restrictions as needed, regular meal and snack breaks, staying well hydrated, etc.    . Vag. Bleeding: None.   . Leaking Fluid denies.  ----------------------------------------------------------------------------------- The following portions of the patient's history were reviewed and updated as appropriate: allergies, current medications, past family history, past medical history, past social history, past surgical history and problem list. Problem list updated.  Objective  Blood pressure 122/74, weight 167 lb (75.8 kg), last menstrual period 12/16/2018. Pregravid weight 163 lb (73.9 kg) Total Weight Gain 4 lb (1.814 kg) Urinalysis: Urine Protein    Urine Glucose    Fetal Status: Fetal Heart Rate (bpm): 164         Dating scan today: 8 weeks 2 days, EDD not adjusted  General:  Alert, oriented and cooperative. Patient is in no acute distress.  Skin: Skin is warm and dry. No rash noted.   Cardiovascular: Normal heart rate noted  Respiratory: Normal respiratory effort, no problems with respiration noted  Abdomen: Soft, gravid, appropriate for gestational age.       Pelvic:  Cervical exam deferred        Extremities: Normal range of motion.     Mental Status: Normal mood and affect. Normal  behavior. Normal judgment and thought content.   Assessment   18 y.o. G2P0010 at [redacted]w[redacted]d by  09/22/2019, by Last Menstrual Period presenting for routine prenatal visit  Plan   Pregnancy #2 Problems (from 12/16/18 to present)    Problem Noted Resolved   Supervision of other normal pregnancy, antepartum 01/30/2019 by Natale Milch, MD No   Overview Signed 01/30/2019  4:03 PM by Natale Milch, MD      Clinic [facility] Prenatal Labs  Dating  Blood type: A/Positive/-- (09/28 1446)   Genetic Screen 1 Screen:     AFP:      Cystic Fibrosis:    NIPS:    Antibody:Negative (09/28 1446)  Anatomic [place]  Rubella: Immune  Varicella:Immune  GTT Early:        28 wk:      RPR: Non Reactive (09/28 1446)   Rhogam  not needed HBsAg: Negative (09/28 1446)   TDaP vaccine                       HIV: Non Reactive (09/28 1446)   Flu Shot                                GBS:   Contraception  Pap: Under 21  CBB     CS/VBAC  no history   Baby Food    Support Person                  Preterm labor symptoms and general obstetric precautions including but not limited to vaginal bleeding, contractions, leaking of  fluid and fetal movement were reviewed in detail with the patient.   Return in about 4 weeks (around 03/07/2019) for rob.  Rod Can, CNM 02/07/2019 9:33 AM

## 2019-02-07 NOTE — Progress Notes (Signed)
Dating scan today. No vb. No lof.  °

## 2019-02-07 NOTE — Progress Notes (Signed)
Discussed with patient on phone GBS positive, treat in labor.

## 2019-02-12 ENCOUNTER — Telehealth: Payer: Self-pay

## 2019-02-12 NOTE — Telephone Encounter (Signed)
Pt called after hour nurse 02/09/19 at 7:13pm c/o 8wks; clear-milky d/c; stringy mucus d/c; no odor; no fever; mild cramping.  Was adv per AMS cramping can be normal in the first part of preg; GBS count was low; monitor sxs - if bleeding or inc cramping occurs to be evaluated; otherwise call office Mon.  I called pt today; states she is feeling much better.

## 2019-02-13 ENCOUNTER — Telehealth: Payer: Self-pay

## 2019-02-13 NOTE — Telephone Encounter (Signed)
Pt called after hour nurse 02/09/19 at 8:58 pm ; has question about being 8w and cramping.  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  No sxs have gotten worse since she spoke c triage nurse earlier.  [phone]  Pt was adv no restrictions at this time; if uncomfortable c the amt of physical labor she does at her job and a hx of miscarriage, she can discuss c her OB any recommendations.  Adv to call back for concerns or worsening sxs.

## 2019-03-07 ENCOUNTER — Encounter: Payer: Self-pay | Admitting: Obstetrics and Gynecology

## 2019-03-07 ENCOUNTER — Other Ambulatory Visit: Payer: Self-pay

## 2019-03-07 ENCOUNTER — Ambulatory Visit (INDEPENDENT_AMBULATORY_CARE_PROVIDER_SITE_OTHER): Payer: Managed Care, Other (non HMO) | Admitting: Obstetrics and Gynecology

## 2019-03-07 VITALS — BP 118/76 | Wt 168.0 lb

## 2019-03-07 DIAGNOSIS — Z23 Encounter for immunization: Secondary | ICD-10-CM

## 2019-03-07 DIAGNOSIS — Z13228 Encounter for screening for other metabolic disorders: Secondary | ICD-10-CM

## 2019-03-07 DIAGNOSIS — Z1379 Encounter for other screening for genetic and chromosomal anomalies: Secondary | ICD-10-CM

## 2019-03-07 DIAGNOSIS — Z3480 Encounter for supervision of other normal pregnancy, unspecified trimester: Secondary | ICD-10-CM

## 2019-03-07 DIAGNOSIS — Z3A11 11 weeks gestation of pregnancy: Secondary | ICD-10-CM

## 2019-03-07 DIAGNOSIS — Z3481 Encounter for supervision of other normal pregnancy, first trimester: Secondary | ICD-10-CM

## 2019-03-07 LAB — POCT URINALYSIS DIPSTICK OB: POC,PROTEIN,UA: NEGATIVE

## 2019-03-07 NOTE — Progress Notes (Signed)
    Routine Prenatal Care Visit  Subjective  Sara Henson is a 18 y.o. G2P0010 at [redacted]w[redacted]d being seen today for ongoing prenatal care.  She is currently monitored for the following issues for this low-risk pregnancy and has Loss of weight; Disordered eating; and Supervision of other normal pregnancy, antepartum on their problem list.  ----------------------------------------------------------------------------------- Patient reports no complaints.   Contractions: Not present. Vag. Bleeding: None.  Movement: Absent. Denies leaking of fluid.  ----------------------------------------------------------------------------------- The following portions of the patient's history were reviewed and updated as appropriate: allergies, current medications, past family history, past medical history, past social history, past surgical history and problem list. Problem list updated.   Objective  Blood pressure 118/76, weight 168 lb (76.2 kg), last menstrual period 12/16/2018. Pregravid weight 163 lb (73.9 kg) Total Weight Gain 5 lb (2.268 kg) Urinalysis:      Fetal Status: Fetal Heart Rate (bpm): 145   Movement: Absent     General:  Alert, oriented and cooperative. Patient is in no acute distress.  Skin: Skin is warm and dry. No rash noted.   Cardiovascular: Normal heart rate noted  Respiratory: Normal respiratory effort, no problems with respiration noted  Abdomen: Soft, gravid, appropriate for gestational age. Pain/Pressure: Absent     Pelvic:  Cervical exam deferred        Extremities: Normal range of motion.  Edema: None  Mental Status: Normal mood and affect. Normal behavior. Normal judgment and thought content.     Assessment   18 y.o. G2P0010 at [redacted]w[redacted]d by  09/22/2019, by Last Menstrual Period presenting for routine prenatal visit  Plan   Pregnancy #2 Problems (from 12/16/18 to present)    Problem Noted Resolved   Supervision of other normal pregnancy, antepartum 01/30/2019 by Homero Fellers, MD No   Overview Addendum 02/07/2019  9:37 AM by Rod Can, [place] Prenatal Labs  Dating By LMP c/w 8w u/s Blood type: A/Positive/-- (09/28 1446)   Genetic Screen 1 Screen:     AFP:      Cystic Fibrosis:    NIPS:    Antibody:Negative (09/28 1446)  Anatomic [place]  Rubella: Immune  Varicella:Immune  GTT Early:        28 wk:      RPR: Non Reactive (09/28 1446)   Rhogam  not needed HBsAg: Negative (09/28 1446)   TDaP vaccine                       HIV: Non Reactive (09/28 1446)   Flu Shot                                GBS:   Contraception  Pap: Under 21  CBB     CS/VBAC  no history   Baby Food    Support Person                  Gestational age appropriate obstetric precautions including but not limited to vaginal bleeding, contractions, leaking of fluid and fetal movement were reviewed in detail with the patient.    CF screening and down syndrome screening today- patient would like a gender envelope Flu shot today  Return in about 4 weeks (around 04/04/2019) for ROB in person.  Homero Fellers MD [facility], [facility] 03/07/2019, 5:20 PM

## 2019-03-07 NOTE — Progress Notes (Signed)
ROB No concerns  Denies lof , no vb, No FM Declines flu

## 2019-03-12 LAB — MATERNIT21 PLUS CORE+SCA
Fetal Fraction: 6
Monosomy X (Turner Syndrome): NOT DETECTED
Result (T21): NEGATIVE
Trisomy 13 (Patau syndrome): NEGATIVE
Trisomy 18 (Edwards syndrome): NEGATIVE
Trisomy 21 (Down syndrome): NEGATIVE
XXX (Triple X Syndrome): NOT DETECTED
XXY (Klinefelter Syndrome): NOT DETECTED
XYY (Jacobs Syndrome): NOT DETECTED

## 2019-03-12 NOTE — Progress Notes (Signed)
Called and discussed with patient

## 2019-03-14 LAB — CYSTIC FIBROSIS MUTATION 97: Interpretation: NOT DETECTED

## 2019-03-15 NOTE — Progress Notes (Signed)
Negative, released to mychart

## 2019-04-04 ENCOUNTER — Other Ambulatory Visit: Payer: Self-pay

## 2019-04-04 ENCOUNTER — Ambulatory Visit (INDEPENDENT_AMBULATORY_CARE_PROVIDER_SITE_OTHER): Payer: Managed Care, Other (non HMO) | Admitting: Advanced Practice Midwife

## 2019-04-04 ENCOUNTER — Encounter: Payer: Self-pay | Admitting: Advanced Practice Midwife

## 2019-04-04 VITALS — BP 124/74 | Wt 176.0 lb

## 2019-04-04 DIAGNOSIS — Z3A15 15 weeks gestation of pregnancy: Secondary | ICD-10-CM

## 2019-04-04 DIAGNOSIS — Z3480 Encounter for supervision of other normal pregnancy, unspecified trimester: Secondary | ICD-10-CM

## 2019-04-04 DIAGNOSIS — Z3482 Encounter for supervision of other normal pregnancy, second trimester: Secondary | ICD-10-CM

## 2019-04-04 NOTE — Progress Notes (Signed)
No vb. No lof.  

## 2019-04-04 NOTE — Patient Instructions (Signed)

## 2019-04-04 NOTE — Progress Notes (Signed)
Routine Prenatal Care Visit  Subjective  Sara Henson is a 18 y.o. G2P0010 at [redacted]w[redacted]d being seen today for ongoing prenatal care.  She is currently monitored for the following issues for this low-risk pregnancy and has Loss of weight; Disordered eating; and Supervision of other normal pregnancy, antepartum on their problem list.  ----------------------------------------------------------------------------------- Patient reports no complaints.  She has general prenatal questions regarding sleeping positions, house painting, etc. She has also heard about amniotic fluid leaking and wonders how we would know if that is happening. All questions answered. Contractions: Not present. Vag. Bleeding: None.  Movement: Present. Leaking Fluid denies.  ----------------------------------------------------------------------------------- The following portions of the patient's history were reviewed and updated as appropriate: allergies, current medications, past family history, past medical history, past social history, past surgical history and problem list. Problem list updated.  Objective  Blood pressure 124/74, weight 176 lb (79.8 kg), last menstrual period 12/16/2018. Pregravid weight 163 lb (73.9 kg) Total Weight Gain 13 lb (5.897 kg) Urinalysis: Urine Protein    Urine Glucose    Fetal Status: Fetal Heart Rate (bpm): 142   Movement: Present     General:  Alert, oriented and cooperative. Patient is in no acute distress.  Skin: Skin is warm and dry. No rash noted.   Cardiovascular: Normal heart rate noted  Respiratory: Normal respiratory effort, no problems with respiration noted  Abdomen: Soft, gravid, appropriate for gestational age. Pain/Pressure: Present     Pelvic:  Cervical exam deferred        Extremities: Normal range of motion.  Edema: None  Mental Status: Normal mood and affect. Normal behavior. Normal judgment and thought content.   Assessment   18 y.o. G2P0010 at [redacted]w[redacted]d by  09/22/2019, by  Last Menstrual Period presenting for routine prenatal visit  Plan   Pregnancy #2 Problems (from 12/16/18 to present)    Problem Noted Resolved   Supervision of other normal pregnancy, antepartum 01/30/2019 by Natale Milch, MD No   Overview Addendum 03/07/2019  4:56 PM by Natale Milch, MD      Clinic [facility] Prenatal Labs  Dating By LMP c/w 8w u/s Blood type: A/Positive/-- (09/28 1446)   Genetic Screen 1 Screen:     AFP:      Cystic Fibrosis:    NIPS:    Antibody:Negative (09/28 1446)  Anatomic [place]  Rubella: Immune  Varicella:Immune  GTT Early:        28 wk:      RPR: Non Reactive (09/28 1446)   Rhogam  not needed HBsAg: Negative (09/28 1446)   TDaP vaccine                       HIV: Non Reactive (09/28 1446)   Flu Shot   03/07/2019                             GBS:   Contraception  Pap: Under 21  CBB     CS/VBAC  no history   Baby Food    Support Person                  Preterm labor symptoms and general obstetric precautions including but not limited to vaginal bleeding, contractions, leaking of fluid and fetal movement were reviewed in detail with the patient. Please refer to After Visit Summary for other counseling recommendations.   Return in about 4 weeks (around 05/02/2019) for anatomy scan  and rob.  Rod Can, CNM 04/04/2019 8:39 AM

## 2019-05-02 ENCOUNTER — Ambulatory Visit (INDEPENDENT_AMBULATORY_CARE_PROVIDER_SITE_OTHER): Payer: Managed Care, Other (non HMO)

## 2019-05-02 ENCOUNTER — Other Ambulatory Visit: Payer: Self-pay

## 2019-05-02 ENCOUNTER — Encounter: Payer: Managed Care, Other (non HMO) | Admitting: Obstetrics and Gynecology

## 2019-05-02 DIAGNOSIS — Z363 Encounter for antenatal screening for malformations: Secondary | ICD-10-CM

## 2019-05-02 DIAGNOSIS — Z3480 Encounter for supervision of other normal pregnancy, unspecified trimester: Secondary | ICD-10-CM

## 2019-05-03 ENCOUNTER — Ambulatory Visit (INDEPENDENT_AMBULATORY_CARE_PROVIDER_SITE_OTHER): Payer: Managed Care, Other (non HMO) | Admitting: Advanced Practice Midwife

## 2019-05-03 ENCOUNTER — Encounter: Payer: Self-pay | Admitting: Advanced Practice Midwife

## 2019-05-03 DIAGNOSIS — Z3A19 19 weeks gestation of pregnancy: Secondary | ICD-10-CM

## 2019-05-03 DIAGNOSIS — Z3482 Encounter for supervision of other normal pregnancy, second trimester: Secondary | ICD-10-CM

## 2019-05-03 NOTE — Progress Notes (Signed)
Routine Prenatal Care Visit- Virtual Visit  Subjective   Virtual Visit via Telephone Note  I connected with Sara Henson on 05/03/19 at  9:10 AM EST by telephone and verified that I am speaking with the correct person using two identifiers.   I discussed the limitations, risks, security and privacy concerns of performing an evaluation and management service by telephone and the availability of in person appointments. I also discussed with the patient that there may be a patient responsible charge related to this service. The patient expressed understanding and agreed to proceed.  The patient was at home I spoke with the patient from my  Office phone The names of people involved in this encounter were: Sara Henson, her husband and myself Tresea Mall, CNM  Sara Henson is a 18 y.o. G2P0010 at [redacted]w[redacted]d being seen today for ongoing prenatal care.  She is currently monitored for the following issues for this low-risk pregnancy and has Loss of weight; Disordered eating; and Supervision of other normal pregnancy, antepartum on their problem list.  ----------------------------------------------------------------------------------- Patient reports no complaints.  She found out she had a covid exposure last night. Her husband's grandmother is covid positive. The patient admits her usual nasal congestion related to allergies and otherwise feels well. She had her anatomy scan done yesterday. We discussed the results of that including follow up placentation at 28 weeks.  Contractions: Not present. Vag. Bleeding: None.  Movement: Present. Denies leaking of fluid.  ----------------------------------------------------------------------------------- The following portions of the patient's history were reviewed and updated as appropriate: allergies, current medications, past family history, past medical history, past social history, past surgical history and problem list. Problem list  updated.   Objective  Last menstrual period 12/16/2018. Pregravid weight 163 lb (73.9 kg) Total Weight Gain 13 lb (5.897 kg) Urinalysis:      Fetal Status:     Movement: Present      Anatomy scan done 05/02/19 Complete, normal, female, breech. Placenta anterior, marginal previa at ~2 cm from cervix. FHTs 149  Physical Exam could not be performed. Because of the COVID-19 outbreak this visit was performed over the phone and not in person.   Assessment   18 y.o. G2P0010 at [redacted]w[redacted]d by  09/22/2019, by Last Menstrual Period presenting for routine prenatal visit  Plan   Pregnancy #2 Problems (from 12/16/18 to present)    Problem Noted Resolved   Supervision of other normal pregnancy, antepartum 01/30/2019 by Natale Milch, MD No   Overview Addendum 03/07/2019  4:56 PM by Natale Milch, MD      [facility]inic [facility] Prenatal Labs  Dating By LMP c/w 8w u/s Blood type: A/Positive/-- (09/28 1446)   Genetic Screen 1 Screen:     AFP:      Cystic Fibrosis:    NIPS:    Antibody:Negative (09/28 1446)  Anatomic [place]  Rubella: Immune  Varicella:Immune  GTT Early:        28 wk:      RPR: Non Reactive (09/28 1446)   Rhogam  not needed HBsAg: Negative (09/28 1446)   TDaP vaccine                       HIV: Non Reactive (09/28 1446)   Flu Shot   03/07/2019                             GBS:   Contraception  Pap: Under  21  CBB     CS/VBAC  no history   Baby Food    Support Person                  Gestational age appropriate obstetric precautions including but not limited to vaginal bleeding, contractions, leaking of fluid and fetal movement were reviewed in detail with the patient.     Follow Up Instructions: Follow up placentation at 28 weeks   I discussed the assessment and treatment plan with the patient. The patient was provided an opportunity to ask questions and all were answered. The patient agreed with the plan and demonstrated an understanding of the instructions.   The  patient was advised to call back or seek an in-person evaluation if the symptoms worsen or if the condition fails to improve as anticipated.  I provided 12 minutes of non-face-to-face time during this encounter.  Return in about 4 weeks (around 05/31/2019) for rob.   Rod Can, [facility] 05/03/2019, 9:33 AM

## 2019-05-03 NOTE — Progress Notes (Signed)
Telephone visit d/t COVID exposure  Anatomy scan yesterday 12/30

## 2019-06-04 ENCOUNTER — Ambulatory Visit (INDEPENDENT_AMBULATORY_CARE_PROVIDER_SITE_OTHER): Payer: Managed Care, Other (non HMO) | Admitting: Certified Nurse Midwife

## 2019-06-04 ENCOUNTER — Other Ambulatory Visit: Payer: Self-pay

## 2019-06-04 ENCOUNTER — Encounter: Payer: Managed Care, Other (non HMO) | Admitting: Obstetrics and Gynecology

## 2019-06-04 VITALS — BP 110/60 | Wt 197.0 lb

## 2019-06-04 DIAGNOSIS — R0602 Shortness of breath: Secondary | ICD-10-CM

## 2019-06-04 DIAGNOSIS — O4422 Partial placenta previa NOS or without hemorrhage, second trimester: Secondary | ICD-10-CM

## 2019-06-04 DIAGNOSIS — O4420 Partial placenta previa NOS or without hemorrhage, unspecified trimester: Secondary | ICD-10-CM

## 2019-06-04 DIAGNOSIS — Z130 Encounter for screening for diseases of the blood and blood-forming organs and certain disorders involving the immune mechanism: Secondary | ICD-10-CM

## 2019-06-04 DIAGNOSIS — B353 Tinea pedis: Secondary | ICD-10-CM

## 2019-06-04 DIAGNOSIS — O09892 Supervision of other high risk pregnancies, second trimester: Secondary | ICD-10-CM

## 2019-06-04 DIAGNOSIS — Z131 Encounter for screening for diabetes mellitus: Secondary | ICD-10-CM

## 2019-06-04 DIAGNOSIS — Z3A24 24 weeks gestation of pregnancy: Secondary | ICD-10-CM

## 2019-06-04 DIAGNOSIS — Z113 Encounter for screening for infections with a predominantly sexual mode of transmission: Secondary | ICD-10-CM

## 2019-06-04 DIAGNOSIS — Z3480 Encounter for supervision of other normal pregnancy, unspecified trimester: Secondary | ICD-10-CM

## 2019-06-04 LAB — POCT URINALYSIS DIPSTICK OB: POC,PROTEIN,UA: NEGATIVE

## 2019-06-04 NOTE — Progress Notes (Signed)
C/o ?athlete's feet - what to do?  Hard to breathe sometimes.rj

## 2019-06-17 NOTE — Progress Notes (Signed)
ROB at 24wk2d: Complains of feeling SOB at times. No fever or cough. No vaginal bleeding. Marginal previa on earlier ultrasound. A positive blood type Thinks she has athlete's foot-toes on right foot cracked and itchy skin Exam: General: gravid female in NAD. Gained 21# since last visit 2 months ago. Respiratory: normal respiratory effort, CTAB Pulse O2: 98% Trace edema of LE Right foot: cracked skin around 5th digit A: IUP at 24wk2d Episodes of SOB-may be due to physiological changes of pregnancy Athlete's foot Marginal previa on previous ultrasound P: Ultrasound for placental location in 4 weeks 28 week labs in 4 weeks Lotrimin for athlete's foot Given RSB info as well as info on childbirth classes  Farrel Conners, CNM

## 2019-06-21 ENCOUNTER — Telehealth: Payer: Self-pay

## 2019-06-21 NOTE — Telephone Encounter (Signed)
Pt calling; today is day 11 of covid sxs; tested positive for covid Sat.; this is day 3 of decreased FM.  [phone]  Pt is feeling much better from her covid sxs; has been very busy the last 2 days and may not have felt FM; has felt movement today but not that much; has just drank orange juice.  Adv to lie on left side and pay attention to movement; if doesn't have four movements in an hour to drink some caffeine and eat some protein and try another hour.  If still not four movements in an hour to call L&D; number given.

## 2019-07-02 ENCOUNTER — Encounter: Payer: Self-pay | Admitting: Obstetrics and Gynecology

## 2019-07-02 ENCOUNTER — Other Ambulatory Visit: Payer: Self-pay | Admitting: Certified Nurse Midwife

## 2019-07-02 ENCOUNTER — Ambulatory Visit (INDEPENDENT_AMBULATORY_CARE_PROVIDER_SITE_OTHER): Payer: Managed Care, Other (non HMO)

## 2019-07-02 ENCOUNTER — Other Ambulatory Visit: Payer: Managed Care, Other (non HMO)

## 2019-07-02 ENCOUNTER — Ambulatory Visit (INDEPENDENT_AMBULATORY_CARE_PROVIDER_SITE_OTHER): Payer: Managed Care, Other (non HMO) | Admitting: Obstetrics and Gynecology

## 2019-07-02 ENCOUNTER — Other Ambulatory Visit: Payer: Self-pay

## 2019-07-02 VITALS — BP 120/70 | Wt 205.0 lb

## 2019-07-02 DIAGNOSIS — Z131 Encounter for screening for diabetes mellitus: Secondary | ICD-10-CM

## 2019-07-02 DIAGNOSIS — O4420 Partial placenta previa NOS or without hemorrhage, unspecified trimester: Secondary | ICD-10-CM

## 2019-07-02 DIAGNOSIS — O4423 Partial placenta previa NOS or without hemorrhage, third trimester: Secondary | ICD-10-CM | POA: Diagnosis not present

## 2019-07-02 DIAGNOSIS — Z3A28 28 weeks gestation of pregnancy: Secondary | ICD-10-CM | POA: Diagnosis not present

## 2019-07-02 DIAGNOSIS — Z3480 Encounter for supervision of other normal pregnancy, unspecified trimester: Secondary | ICD-10-CM

## 2019-07-02 DIAGNOSIS — Z3483 Encounter for supervision of other normal pregnancy, third trimester: Secondary | ICD-10-CM

## 2019-07-02 DIAGNOSIS — Z130 Encounter for screening for diseases of the blood and blood-forming organs and certain disorders involving the immune mechanism: Secondary | ICD-10-CM

## 2019-07-02 DIAGNOSIS — Z113 Encounter for screening for infections with a predominantly sexual mode of transmission: Secondary | ICD-10-CM

## 2019-07-02 DIAGNOSIS — R609 Edema, unspecified: Secondary | ICD-10-CM

## 2019-07-02 NOTE — Progress Notes (Signed)
ROB/US/1 Hr GTT C/o swelling in hand and feet Denies lof, no vb, Good FM

## 2019-07-02 NOTE — Progress Notes (Signed)
Routine Prenatal Care Visit  Subjective  Sara Henson is a 19 y.o. G2P0010 at [redacted]w[redacted]d being seen today for ongoing prenatal care.  She is currently monitored for the following issues for this low-risk pregnancy and has Loss of weight; Disordered eating; and Supervision of other normal pregnancy, antepartum on their problem list.  ----------------------------------------------------------------------------------- Patient reports no complaints.   Contractions: Not present. Vag. Bleeding: None.  Movement: Present. Denies leaking of fluid.  ----------------------------------------------------------------------------------- The following portions of the patient's history were reviewed and updated as appropriate: allergies, current medications, past family history, past medical history, past social history, past surgical history and problem list. Problem list updated.   Objective  Blood pressure 120/70, weight 205 lb (93 kg), last menstrual period 12/16/2018. Pregravid weight 163 lb (73.9 kg) Total Weight Gain 42 lb (19.1 kg) Urinalysis:      Fetal Status: Fetal Heart Rate (bpm): 130 Fundal Height: 28 cm Movement: Present  Presentation: Vertex  General:  Alert, oriented and cooperative. Patient is in no acute distress.  Skin: Skin is warm and dry. No rash noted.   Cardiovascular: Normal heart rate noted  Respiratory: Normal respiratory effort, no problems with respiration noted  Abdomen: Soft, gravid, appropriate for gestational age. Pain/Pressure: Absent     Pelvic:  Cervical exam deferred        Extremities: Normal range of motion.     Mental Status: Normal mood and affect. Normal behavior. Normal judgment and thought content.     Assessment   18 y.o. G2P0010 at [redacted]w[redacted]d by  09/22/2019, by Last Menstrual Period presenting for routine prenatal visit  Plan   Pregnancy #2 Problems (from 12/16/18 to present)    Problem Noted Resolved   Supervision of other normal pregnancy, antepartum  01/30/2019 by Natale Milch, MD No   Overview Addendum 06/17/2019  3:43 PM by Farrel Conners, CNM      [facility]inic [facility] Prenatal Labs  Dating By LMP c/w 8w u/s Blood type: A/Positive/-- (09/28 1446)   Genetic Screen 1 Screen:     AFP:      Cystic Fibrosis:  negative  NIPS:   Diploid XX Antibody:Negative (09/28 1446)  Anatomic [place] Marginal placenta, anterior. Female gender Rubella: Immune  Varicella:Immune  GTT Early:        28 wk:      RPR: Non Reactive (09/28 1446)   Rhogam  not needed HBsAg: Negative (09/28 1446)   TDaP vaccine                       HIV: Non Reactive (09/28 1446)   Flu Shot   03/07/2019                             GBS:   Contraception  Pap: Under 21  CBB     CS/VBAC  no history   Baby Food Breast   Support Person                  Gestational age appropriate obstetric precautions including but not limited to vaginal bleeding, contractions, leaking of fluid and fetal movement were reviewed in detail with the patient.    Discussed compression socks for leg edema, trace today worse with standing for long epriods Marginal placenta resolved 28 week labs today.  Return in about 2 weeks (around 07/16/2019) for ROB in person.  Natale Milch MD [facility], [facility] 07/02/2019,  10:20 AM

## 2019-07-03 LAB — 28 WEEK RH+PANEL
Basophils Absolute: 0 10*3/uL (ref 0.0–0.2)
Basos: 0 %
EOS (ABSOLUTE): 0.1 10*3/uL (ref 0.0–0.4)
Eos: 1 %
Gestational Diabetes Screen: 120 mg/dL (ref 65–139)
HIV Screen 4th Generation wRfx: NONREACTIVE
Hematocrit: 38.6 % (ref 34.0–46.6)
Hemoglobin: 13.2 g/dL (ref 11.1–15.9)
Immature Grans (Abs): 0.1 10*3/uL (ref 0.0–0.1)
Immature Granulocytes: 1 %
Lymphocytes Absolute: 1.4 10*3/uL (ref 0.7–3.1)
Lymphs: 17 %
MCH: 31.1 pg (ref 26.6–33.0)
MCHC: 34.2 g/dL (ref 31.5–35.7)
MCV: 91 fL (ref 79–97)
Monocytes Absolute: 0.6 10*3/uL (ref 0.1–0.9)
Monocytes: 7 %
Neutrophils Absolute: 6.3 10*3/uL (ref 1.4–7.0)
Neutrophils: 74 %
Platelets: 202 10*3/uL (ref 150–450)
RBC: 4.25 x10E6/uL (ref 3.77–5.28)
RDW: 12.1 % (ref 11.7–15.4)
RPR Ser Ql: NONREACTIVE
WBC: 8.4 10*3/uL (ref 3.4–10.8)

## 2019-07-16 ENCOUNTER — Ambulatory Visit (INDEPENDENT_AMBULATORY_CARE_PROVIDER_SITE_OTHER): Payer: Managed Care, Other (non HMO) | Admitting: Obstetrics and Gynecology

## 2019-07-16 ENCOUNTER — Other Ambulatory Visit: Payer: Self-pay

## 2019-07-16 VITALS — BP 114/62 | Wt 210.0 lb

## 2019-07-16 DIAGNOSIS — Z23 Encounter for immunization: Secondary | ICD-10-CM | POA: Diagnosis not present

## 2019-07-16 DIAGNOSIS — Z3480 Encounter for supervision of other normal pregnancy, unspecified trimester: Secondary | ICD-10-CM

## 2019-07-16 DIAGNOSIS — Z3A30 30 weeks gestation of pregnancy: Secondary | ICD-10-CM

## 2019-07-16 LAB — POCT URINALYSIS DIPSTICK OB: POC,PROTEIN,UA: NEGATIVE

## 2019-07-16 NOTE — Progress Notes (Signed)
    Routine Prenatal Care Visit  Subjective  Sara Henson is a 19 y.o. G2P0010 at [redacted]w[redacted]d being seen today for ongoing prenatal care.  She is currently monitored for the following issues for this low-risk pregnancy and has Loss of weight; Disordered eating; and Supervision of other normal pregnancy, antepartum on their problem list.  ----------------------------------------------------------------------------------- Patient reports no complaints.   Contractions: Not present. Vag. Bleeding: None.  Movement: Present. Denies leaking of fluid.  ----------------------------------------------------------------------------------- The following portions of the patient's history were reviewed and updated as appropriate: allergies, current medications, past family history, past medical history, past social history, past surgical history and problem list. Problem list updated.   Objective  Blood pressure 114/62, weight 210 lb (95.3 kg), last menstrual period 12/16/2018. Pregravid weight 163 lb (73.9 kg) Total Weight Gain 47 lb (21.3 kg) Urinalysis:      Fetal Status: Fetal Heart Rate (bpm): 135 Fundal Height: 30 cm Movement: Present  Presentation: Vertex  General:  Alert, oriented and cooperative. Patient is in no acute distress.  Skin: Skin is warm and dry. No rash noted.   Cardiovascular: Normal heart rate noted  Respiratory: Normal respiratory effort, no problems with respiration noted  Abdomen: Soft, gravid, appropriate for gestational age. Pain/Pressure: Absent     Pelvic:  Cervical exam deferred        Extremities: Normal range of motion.     ental Status: Normal mood and affect. Normal behavior. Normal judgment and thought content.     Assessment   19 y.o. G2P0010 at [redacted]w[redacted]d by  09/22/2019, by Last Menstrual Period presenting for routine prenatal visit  Plan   Pregnancy #2 Problems (from 12/16/18 to present)    Problem Noted Resolved   Supervision of other normal pregnancy, antepartum  01/30/2019 by Natale Milch, MD No   Overview Addendum 06/17/2019  3:43 PM by Farrel Conners, CNM      [facility]inic [facility] Prenatal Labs  Dating By LMP c/w 8w u/s Blood type: A/Positive/-- (09/28 1446)   Genetic Screen 1 Screen:     AFP:      Cystic Fibrosis:  negative  NIPS:   Diploid XX Antibody:Negative (09/28 1446)  Anatomic [place] Marginal placenta, anterior. Female gender Rubella: Immune  Varicella:Immune  GTT 28 wk:  120  RPR: Non Reactive (09/28 1446)   Rhogam  not needed HBsAg: Negative (09/28 1446)   TDaP vaccine                       HIV: Non Reactive (09/28 1446)   Flu Shot   03/07/2019                             GBS:   Contraception  Pap: Under 21  CBB     CS/VBAC  no history   Baby Food Breast   Support Person                  Gestational age appropriate obstetric precautions including but not limited to vaginal bleeding, contractions, leaking of fluid and fetal movement were reviewed in detail with the patient.    Anesthesia consult for sister history of cardiac arrest after epidural  Return in about 2 weeks (around 07/30/2019) for ROB.  Vena Austria, MD, [facility], [facility] 07/16/2019, 8:31 AM

## 2019-07-16 NOTE — Progress Notes (Signed)
ROB °TDAP today ° °

## 2019-07-23 ENCOUNTER — Telehealth: Payer: Self-pay

## 2019-07-23 NOTE — Telephone Encounter (Signed)
Pt calling; c/o numbness and tingling in right wrist; pretty constant.  [phone]  Adv to get wrist splint and wear at night; if doesn't help much may need to wear 24/7; take e.s. tylenol as well.

## 2019-07-24 ENCOUNTER — Telehealth: Payer: Self-pay | Admitting: Obstetrics and Gynecology

## 2019-07-24 NOTE — Telephone Encounter (Signed)
L/M for pt to rtn call.  Called to adv pt of appt scheduled with anesthesia on 4/12 @ 12:30 per Dr. Ramiro Harvest request due to family history.

## 2019-07-30 ENCOUNTER — Encounter: Payer: Self-pay | Admitting: Advanced Practice Midwife

## 2019-07-30 ENCOUNTER — Ambulatory Visit (INDEPENDENT_AMBULATORY_CARE_PROVIDER_SITE_OTHER): Payer: Managed Care, Other (non HMO) | Admitting: Advanced Practice Midwife

## 2019-07-30 ENCOUNTER — Other Ambulatory Visit: Payer: Self-pay

## 2019-07-30 VITALS — BP 110/60 | Wt 213.0 lb

## 2019-07-30 DIAGNOSIS — Z3A32 32 weeks gestation of pregnancy: Secondary | ICD-10-CM

## 2019-07-30 DIAGNOSIS — Z3483 Encounter for supervision of other normal pregnancy, third trimester: Secondary | ICD-10-CM

## 2019-07-30 LAB — POCT URINALYSIS DIPSTICK OB
Glucose, UA: NEGATIVE
POC,PROTEIN,UA: NEGATIVE

## 2019-07-30 NOTE — Addendum Note (Signed)
Addended by: Cornelius Moras D on: 07/30/2019 09:12 AM   Modules accepted: Orders

## 2019-07-30 NOTE — Progress Notes (Signed)
  Routine Prenatal Care Visit  Subjective  Sara Henson is a 19 y.o. G2P0010 at [redacted]w[redacted]d being seen today for ongoing prenatal care.  She is currently monitored for the following issues for this low-risk pregnancy and has Loss of weight; Disordered eating; and Supervision of other normal pregnancy, antepartum on their problem list.  ----------------------------------------------------------------------------------- Patient reports no complaints.  We discussed focus on healthy eating and staying physically active to avoid additional weight gain. Contractions: Not present. Vag. Bleeding: None.  Movement: Present. Leaking Fluid denies.  ----------------------------------------------------------------------------------- The following portions of the patient's history were reviewed and updated as appropriate: allergies, current medications, past family history, past medical history, past social history, past surgical history and problem list. Problem list updated.  Objective  Blood pressure 110/60, weight 213 lb (96.6 kg), last menstrual period 12/16/2018. Pregravid weight 163 lb (73.9 kg) Total Weight Gain 50 lb (22.7 kg) Urinalysis: Urine Protein    Urine Glucose    Fetal Status: Fetal Heart Rate (bpm): 138 Fundal Height: 33 cm Movement: Present     General:  Alert, oriented and cooperative. Patient is in no acute distress.  Skin: Skin is warm and dry. No rash noted.   Cardiovascular: Normal heart rate noted  Respiratory: Normal respiratory effort, no problems with respiration noted  Abdomen: Soft, gravid, appropriate for gestational age. Pain/Pressure: Present     Pelvic:  Cervical exam deferred        Extremities: Normal range of motion.     Mental Status: Normal mood and affect. Normal behavior. Normal judgment and thought content.   Assessment   19 y.o. G2P0010 at [redacted]w[redacted]d by  09/22/2019, by Last Menstrual Period presenting for routine prenatal visit  Plan   Pregnancy #2 Problems (from  12/16/18 to present)    Problem Noted Resolved   Supervision of other normal pregnancy, antepartum 01/30/2019 by Natale Milch, MD No   Overview Addendum 07/16/2019  8:32 AM by Vena Austria, MD      [facility]inic [facility] Prenatal Labs  Dating By LMP c/w 8w u/s Blood type: A/Positive/-- (09/28 1446)   Genetic Screen 1 Screen:     AFP:      Cystic Fibrosis:  negative  NIPS:   Diploid XX Antibody:Negative (09/28 1446)  Anatomic [place] Marginal placenta, anterior. Female gender Rubella: Immune  Varicella:Immune  GTT 28 wk:  120    RPR: Non Reactive (09/28 1446)   Rhogam  not needed HBsAg: Negative (09/28 1446)   TDaP vaccine 07/16/2019                  HIV: Non Reactive (09/28 1446)   Flu Shot 03/07/2019                             GBS:   Contraception  Pap: Under 21  CBB     CS/VBAC no history   Baby Food Breast   Support Person                  Preterm labor symptoms and general obstetric precautions including but not limited to vaginal bleeding, contractions, leaking of fluid and fetal movement were reviewed in detail with the patient.    Return in about 2 weeks (around 08/13/2019) for rob.  Tresea Mall, CNM 07/30/2019 8:36 AM

## 2019-08-08 NOTE — Telephone Encounter (Signed)
Tried to call pt to make sure she is aware of anesthesia consult on 4/12 @ 12:30.  I have been unsuccessful contacting pt since I scheduled her appt. I printed an appt reminder letter and will send out today.

## 2019-08-09 ENCOUNTER — Telehealth: Payer: Self-pay

## 2019-08-09 NOTE — Telephone Encounter (Signed)
Patient reports she just noticed a lump in her throat (thyroid?) about the size of a quarter. Inquiring if she should be concerned or can wait to discuss at her next apt. Cb#[phone]

## 2019-08-09 NOTE — Telephone Encounter (Signed)
Spoke w/patient. Inquired if she has had any sinus/head congestion recently. She has been having trouble with her allergies and is taking Loratadine. Advised there are lymph nodes in the neck that can sometimes swell if there is any illness/issues in the head/neck area. Not to be too concerned and can follow up at next weeks scheduled apt. Patient advised if Loratadine not taking care of allergies, she can use tylenol sinus or sudafed. Needs to remain well hydrated and report to [place] if she develops fever.

## 2019-08-13 ENCOUNTER — Other Ambulatory Visit: Admission: RE | Admit: 2019-08-13 | Payer: Managed Care, Other (non HMO) | Source: Ambulatory Visit

## 2019-08-14 ENCOUNTER — Ambulatory Visit (INDEPENDENT_AMBULATORY_CARE_PROVIDER_SITE_OTHER): Payer: Managed Care, Other (non HMO) | Admitting: Advanced Practice Midwife

## 2019-08-14 ENCOUNTER — Observation Stay
Admission: EM | Admit: 2019-08-14 | Discharge: 2019-08-14 | Disposition: A | Discharge status: Home / Self Care | Payer: Managed Care, Other (non HMO) | Attending: Obstetrics and Gynecology | Admitting: Obstetrics and Gynecology

## 2019-08-14 ENCOUNTER — Encounter: Payer: Self-pay | Admitting: Advanced Practice Midwife

## 2019-08-14 ENCOUNTER — Encounter: Payer: Self-pay | Admitting: Obstetrics and Gynecology

## 2019-08-14 ENCOUNTER — Other Ambulatory Visit: Payer: Self-pay | Admitting: Obstetrics and Gynecology

## 2019-08-14 ENCOUNTER — Other Ambulatory Visit: Payer: Self-pay

## 2019-08-14 VITALS — BP 144/68 | Wt 217.0 lb

## 2019-08-14 DIAGNOSIS — O99513 Diseases of the respiratory system complicating pregnancy, third trimester: Secondary | ICD-10-CM | POA: Diagnosis not present

## 2019-08-14 DIAGNOSIS — J45909 Unspecified asthma, uncomplicated: Secondary | ICD-10-CM | POA: Insufficient documentation

## 2019-08-14 DIAGNOSIS — E038 Other specified hypothyroidism: Secondary | ICD-10-CM | POA: Diagnosis present

## 2019-08-14 DIAGNOSIS — O99891 Other specified diseases and conditions complicating pregnancy: Secondary | ICD-10-CM

## 2019-08-14 DIAGNOSIS — Z3A34 34 weeks gestation of pregnancy: Secondary | ICD-10-CM

## 2019-08-14 DIAGNOSIS — O26893 Other specified pregnancy related conditions, third trimester: Secondary | ICD-10-CM | POA: Diagnosis present

## 2019-08-14 DIAGNOSIS — E039 Hypothyroidism, unspecified: Secondary | ICD-10-CM

## 2019-08-14 DIAGNOSIS — O99283 Endocrine, nutritional and metabolic diseases complicating pregnancy, third trimester: Secondary | ICD-10-CM | POA: Diagnosis not present

## 2019-08-14 DIAGNOSIS — R030 Elevated blood-pressure reading, without diagnosis of hypertension: Secondary | ICD-10-CM | POA: Diagnosis not present

## 2019-08-14 DIAGNOSIS — O163 Unspecified maternal hypertension, third trimester: Secondary | ICD-10-CM

## 2019-08-14 DIAGNOSIS — R221 Localized swelling, mass and lump, neck: Secondary | ICD-10-CM

## 2019-08-14 DIAGNOSIS — Z3480 Encounter for supervision of other normal pregnancy, unspecified trimester: Secondary | ICD-10-CM

## 2019-08-14 HISTORY — DX: Unspecified maternal hypertension, third trimester: O16.3

## 2019-08-14 LAB — CBC WITH DIFFERENTIAL/PLATELET
Abs Immature Granulocytes: 0.04 10*3/uL (ref 0.00–0.07)
Basophils Absolute: 0 10*3/uL (ref 0.0–0.1)
Basophils Relative: 0 %
Eosinophils Absolute: 0.1 10*3/uL (ref 0.0–0.5)
Eosinophils Relative: 1 %
HCT: 40.2 % (ref 36.0–46.0)
Hemoglobin: 13.7 g/dL (ref 12.0–15.0)
Immature Granulocytes: 1 %
Lymphocytes Relative: 16 %
Lymphs Abs: 1.1 10*3/uL (ref 0.7–4.0)
MCH: 30.9 pg (ref 26.0–34.0)
MCHC: 34.1 g/dL (ref 30.0–36.0)
MCV: 90.5 fL (ref 80.0–100.0)
Monocytes Absolute: 0.6 10*3/uL (ref 0.1–1.0)
Monocytes Relative: 8 %
Neutro Abs: 5.3 10*3/uL (ref 1.7–7.7)
Neutrophils Relative %: 74 %
Platelets: 158 10*3/uL (ref 150–400)
RBC: 4.44 MIL/uL (ref 3.87–5.11)
RDW: 12.5 % (ref 11.5–15.5)
WBC: 7.1 10*3/uL (ref 4.0–10.5)
nRBC: 0 % (ref 0.0–0.2)

## 2019-08-14 LAB — COMPREHENSIVE METABOLIC PANEL
ALT: 20 U/L (ref 0–44)
AST: 21 U/L (ref 15–41)
Albumin: 3.4 g/dL — ABNORMAL LOW (ref 3.5–5.0)
Alkaline Phosphatase: 90 U/L (ref 38–126)
Anion gap: 8 (ref 5–15)
BUN: 8 mg/dL (ref 6–20)
CO2: 22 mmol/L (ref 22–32)
Calcium: 8.8 mg/dL — ABNORMAL LOW (ref 8.9–10.3)
Chloride: 107 mmol/L (ref 98–111)
Creatinine, Ser: 0.44 mg/dL (ref 0.44–1.00)
GFR calc Af Amer: >60 mL/min (ref >60)
GFR calc non Af Amer: >60 mL/min (ref >60)
Glucose, Bld: 122 mg/dL — ABNORMAL HIGH (ref 70–99)
Potassium: 3.8 mmol/L (ref 3.5–5.1)
Sodium: 137 mmol/L (ref 135–145)
Total Bilirubin: 0.6 mg/dL (ref 0.3–1.2)
Total Protein: 6.3 g/dL — ABNORMAL LOW (ref 6.5–8.1)

## 2019-08-14 LAB — POCT URINALYSIS DIPSTICK OB: POC,PROTEIN,UA: NEGATIVE

## 2019-08-14 LAB — T4, FREE: Free T4: 0.56 ng/dL — ABNORMAL LOW (ref 0.61–1.12)

## 2019-08-14 LAB — TYPE AND SCREEN
ABO/RH(D): A POS
Antibody Screen: NEGATIVE

## 2019-08-14 LAB — TSH: TSH: 0.638 u[IU]/mL (ref 0.350–4.500)

## 2019-08-14 LAB — PROTEIN / CREATININE RATIO, URINE
Creatinine, Urine: 31 mg/dL
Total Protein, Urine: <6 mg/dL

## 2019-08-14 NOTE — Patient Instructions (Signed)
Thyroid Nodule  A thyroid nodule is an isolated growth of thyroid cells that forms a lump in your thyroid gland. The thyroid gland is a butterfly-shaped gland. It is found in the lower front of your neck. This gland sends chemical messengers (hormones) through your blood to all parts of your body. These hormones are important in regulating your body temperature and helping your body to use energy. Thyroid nodules are common. Most are not cancerous (benign). You may have one nodule or several nodules. Different types of thyroid nodules include nodules that:  Grow and fill with fluid (thyroid cysts).  Produce too much thyroid hormone (hot nodules or hyperthyroid).  Produce no thyroid hormone (cold nodules or hypothyroid).  Form from cancer cells (thyroid cancers). What are the causes? In most cases, the cause of this condition is not known. What increases the risk? The following factors may make you more likely to develop this condition.  Age. Thyroid nodules become more common in people who are older than 21 years of age.  Gender. ? Benign thyroid nodules are more common in women. ? Cancerous (malignant) thyroid nodules are more common in men.  A family history that includes: ? Thyroid nodules. ? Pheochromocytoma. ? Thyroid carcinoma. ? Hyperparathyroidism.  Certain kinds of thyroid diseases, such as Hashimoto's thyroiditis.  Lack of iodine in your diet.  A history of head and neck radiation, such as from previous cancer treatment. What are the signs or symptoms? In many cases, there are no symptoms. If you have symptoms, they may include:  A lump in your lower neck.  Feeling a lump or tickle in your throat.  Pain in your neck, jaw, or ear.  Having trouble swallowing. Hot nodules may cause symptoms that include:  Weight loss.  Warm, flushed skin.  Feeling hot.  Feeling nervous.  A racing heartbeat. Cold nodules may cause symptoms that include:  Weight  gain.  Dry skin.  Brittle hair. This may also occur with hair loss.  Feeling cold.  Fatigue. Thyroid cancer nodules may cause symptoms that include:  Hard nodules that feel stuck to the thyroid gland.  Hoarseness.  Lumps in the glands near your thyroid (lymph nodes). How is this diagnosed? A thyroid nodule may be felt by your health care provider during a physical exam. This condition may also be diagnosed based on your symptoms. You may also have tests, including:  An ultrasound. This may be done to confirm the diagnosis.  A biopsy. This involves taking a sample from the nodule and looking at it under a microscope.  Blood tests to make sure that your thyroid is working properly.  A thyroid scan. This test uses a radioactive tracer injected into a vein to create an image of the thyroid gland on a computer screen.  Imaging tests such as MRI or CT scan. These may be done if: ? Your nodule is large. ? Your nodule is blocking your airway. ? Cancer is suspected. How is this treated? Treatment depends on the cause and size of your nodule or nodules. If the nodule is benign, treatment may not be necessary. Your health care provider may monitor the nodule to see if it goes away without treatment. If the nodule continues to grow, is cancerous, or does not go away, treatment may be needed. Treatment may include:  Having a cystic nodule drained with a needle.  Ablation therapy. In this treatment, alcohol is injected into the area of the nodule to destroy the cells. Ablation with heat (  thermal ablation) may also be used.  Radioactive iodine. In this treatment, radioactive iodine is given as a pill or liquid that you drink. This substance causes the thyroid nodule to shrink.  Surgery to remove the nodule. Part or all of your thyroid gland may need to be removed as well.  Medicines. Follow these instructions at home:  Pay attention to any changes in your nodule.  Take  over-the-counter and prescription medicines only as told by your health care provider.  Keep all follow-up visits as told by your health care provider. This is important. Contact a health care provider if:  Your voice changes.  You have trouble swallowing.  You have pain in your neck, ear, or jaw that is getting worse.  Your nodule gets bigger.  Your nodule starts to make it harder for you to breathe.  Your muscles look like they are shrinking (muscle wasting). Get help right away if:  You have chest pain.  There is a loss of consciousness.  You have a sudden fever.  You feel confused.  You are seeing or hearing things that other people do not see or hear (having hallucinations).  You feel very weak.  You have mood swings.  You feel very restless.  You feel suddenly nauseous or throw up.  You suddenly have diarrhea. Summary  A thyroid nodule is an isolated growth of thyroid cells that forms a lump in your thyroid gland.  Thyroid nodules are common. Most are not cancerous (benign). You may have one nodule or several nodules.  Treatment depends on the cause and size of your nodule or nodules. If the nodule is benign, treatment may not be necessary.  Your health care provider may monitor the nodule to see if it goes away without treatment. If the nodule continues to grow, is cancerous, or does not go away, treatment may be needed. This information is not intended to replace advice given to you by your health care provider. Make sure you discuss any questions you have with your health care provider. Document Revised: 12/02/2017 Document Reviewed: 12/05/2017 Elsevier Patient Education  2020 [employer]  

## 2019-08-14 NOTE — Discharge Summary (Signed)
See final progress note. 

## 2019-08-14 NOTE — OB Triage Note (Signed)
D/C instructions reviewed with patient. Pt verbalized understanding of instructions and denies any questions at this time. Pt instructed to follow up with OB at next scheduled appointment. Pt educated about hypertension in pregnancy and discharged home with husband.

## 2019-08-14 NOTE — Progress Notes (Signed)
Provider in department. Provider notified of lab results and EFM tracing. Provider at bedside to discuss results and possible discharge home.

## 2019-08-14 NOTE — OB Triage Note (Signed)
Pt is a 19yo G2P0 at [redacted]w[redacted]d that was sent over from [facility] for [facility] evaluation. Pt denies Headache, epigastric pain and states she has occasional dark spots in vision but only when moving positions quickly. Initial FHT 139/83 and cycling q12min. EFM applied and initial fht 140.

## 2019-08-14 NOTE — Progress Notes (Addendum)
Routine Prenatal Care Visit  Subjective  Sara Henson is a 19 y.o. G2P0010 at [redacted]w[redacted]d being seen today for ongoing prenatal care.  She is currently monitored for the following issues for this low-risk pregnancy and has Loss of weight; Disordered eating; and Supervision of other normal pregnancy, antepartum on their problem list.  ----------------------------------------------------------------------------------- Patient reports noticing a lump in her throat about a week ago when she was looking in the mirror. She denies any pain or tenderness and it does not obstruct her swallowing. She denies severe headaches not relieved by tylenol. She denies visual changes or epigastric pain. We discussed having her go to L&D for [facility] evaluation and thyroid ultrasound following 2 elevated blood pressures here in the office.  Thyroid panel drawn at the office.  Contractions: Not present. Vag. Bleeding: None.  Movement: Present. Leaking Fluid denies.  ----------------------------------------------------------------------------------- The following portions of the patient's history were reviewed and updated as appropriate: allergies, current medications, past family history, past medical history, past social history, past surgical history and problem list. Problem list updated.  Objective  Blood pressure (!) 144/68, weight 217 lb (98.4 kg), last menstrual period 12/16/2018.  Repeat BP 142/80  Pregravid weight 163 lb (73.9 kg) Total Weight Gain 54 lb (24.5 kg) Urinalysis: Urine Protein Negative  Urine Glucose (!) Small (1+)  Fetal Status: Fetal Heart Rate (bpm): 140 Fundal Height: 35 cm Movement: Present     General:  Alert, oriented and cooperative. Patient is in no acute distress.  Skin: Skin is warm and dry. No rash noted.   Cardiovascular: Normal heart rate noted  Respiratory: Normal respiratory effort, no problems with respiration noted  Abdomen: Soft, gravid, appropriate for gestational age.  Pain/Pressure: Present     Pelvic:  Cervical exam deferred        Extremities: Normal range of motion.  Edema: None  Mental Status: Normal mood and affect. Normal behavior. Normal judgment and thought content.  HEENT: thyroid nodule palpated on left, non-tender   Assessment   19 y.o. G2P0010 at [redacted]w[redacted]d by  09/22/2019, by Last Menstrual Period presenting for routine prenatal visit  Plan   Pregnancy #2 Problems (from 12/16/18 to present)    Problem Noted Resolved   Supervision of other normal pregnancy, antepartum 01/30/2019 by Natale Milch, MD No   Overview Addendum 07/16/2019  8:32 AM by Vena Austria, MD      Clinic [facility] Prenatal Labs  Dating By LMP c/w 8w u/s Blood type: A/Positive/-- (09/28 1446)   Genetic Screen 1 Screen:     AFP:      Cystic Fibrosis:  negative  NIPS:   Diploid XX Antibody:Negative (09/28 1446)  Anatomic [place] Marginal placenta, anterior. Female gender Rubella: Immune  Varicella:Immune  GTT 28 wk:  120    RPR: Non Reactive (09/28 1446)   Rhogam  not needed HBsAg: Negative (09/28 1446)   TDaP vaccine 07/16/2019                  HIV: Non Reactive (09/28 1446)   Flu Shot 03/07/2019                             GBS:   Contraception  Pap: Under 21  CBB     CS/VBAC no history   Baby Food Breast   Support Person               Thyroid panel drawn today. Go to L&D for  PIH eval and thyroid ultrasound   Preterm labor symptoms and general obstetric precautions including but not limited to vaginal bleeding, contractions, leaking of fluid and fetal movement were reviewed in detail with the patient. Please refer to After Visit Summary for other counseling recommendations.   Return in about 2 weeks (around 08/28/2019) for rob.  Rod Can, CNM 08/14/2019 8:48 AM

## 2019-08-14 NOTE — Final Progress Note (Signed)
Physician Final Progress Note  Patient ID: Sara Henson MRN: [id number] DOB/AGE: [birth year] 19 y.o.  Admit date: 08/14/2019 Admitting provider: Will Bonnet, MD Discharge date: 08/14/2019   Admission Diagnoses:  1) Elevated blood pressure affecting pregnancy in third trimester, antepartum 2) supervision of other normal pregnancy, antepartum  Discharge Diagnoses:  Principal Problem:   Elevated blood pressure affecting pregnancy in third trimester, antepartum Active Problems:   Supervision of other normal pregnancy, antepartum   Indication for care in labor and delivery, antepartum  No evidence of gestational hypertension or preeclampsia  History of Present Illness: The patient is a 19 y.o. female G2P0010 at [redacted]w[redacted]d who presents for evaluation of elevated blood pressure in the office today.  She had a blood pressure of 144/58 in clinic today and it was elevated on repeat blood pressure check. Upon admission for observation to L&D. She notes an occasional headache at home that resolves without treatment, denies currently.  She notes no visual changes and RUQ pain. She notes +FM, no leakage of fluid, no vaginal bleeding, and only occasional tightening of her stomach.  No distinct contractions. She also notes a small nodule on her left neck in the area of the thyroid. She notes a sensation when she swallows, but no other symptoms.    Past Medical History:  Diagnosis Date  . Asthma   . GERD (gastroesophageal reflux disease)     Past Surgical History:  Procedure Laterality Date  . TONSILLECTOMY    . TYMPANOSTOMY TUBE PLACEMENT      No current facility-administered medications on file prior to encounter.   Current Outpatient Medications on File Prior to Encounter  Medication Sig Dispense Refill  . loratadine (CLARITIN) 10 MG tablet Take 10 mg by mouth daily.    . prenatal vitamin w/FE, FA (PRENATAL 1 + 1) 27-1 MG TABS tablet Take 1 tablet by mouth daily at 12 noon.    Marland Kitchen albuterol  (PROVENTIL HFA;VENTOLIN HFA) 108 (90 Base) MCG/ACT inhaler Inhale into the lungs every 6 (six) hours as needed for wheezing or shortness of breath.      No Known Allergies  Social History   Socioeconomic History  . Marital status: Married    Spouse name: Winferd Humphrey  . Number of children: Not on file  . Years of education: Not on file  . Highest education level: Not on file  Occupational History  . Not on file  Tobacco Use  . Smoking status: Never Smoker  . Smokeless tobacco: Former Systems developer    Types: Chew  Substance and Sexual Activity  . Alcohol use: Yes    Comment: social  . Drug use: No  . Sexual activity: Yes    Birth control/protection: Other-see comments    Comment: undecided  Other Topics Concern  . Not on file  Social History Narrative  . Not on file   Social Determinants of Health   Financial Resource Strain:   . Difficulty of Paying Living Expenses:   Food Insecurity:   . Worried About Charity fundraiser in the Last Year:   . Arboriculturist in the Last Year:   Transportation Needs:   . Film/video editor (Medical):   Marland Kitchen Lack of Transportation (Non-Medical):   Physical Activity:   . Days of Exercise per Week:   . Minutes of Exercise per Session:   Stress:   . Feeling of Stress :   Social Connections:   . Frequency of Communication with Friends and Family:   .  Frequency of Social Gatherings with Friends and Family:   . Attends Religious Services:   . Active Member of Clubs or Organizations:   . Attends Banker Meetings:   Marland Kitchen Marital Status:   Intimate Partner Violence:   . Fear of Current or Ex-Partner:   . Emotionally Abused:   Marland Kitchen Physically Abused:   . Sexually Abused:     Family History  Problem Relation Age of Onset  . Skin cancer Father      Review of Systems  Constitutional: Negative.   HENT: Negative.   Eyes: Negative.   Respiratory: Negative.   Cardiovascular: Negative.   Gastrointestinal: Negative.   Genitourinary:  Negative.   Musculoskeletal: Negative.   Skin: Negative.   Neurological: Negative.   Psychiatric/Behavioral: Negative.      Physical Exam: BP 115/68   Pulse 87   Temp 98.1 F (36.7 C) (Oral)   Resp 16   Ht 5\' 7"  (1.702 m)   Wt 98.4 kg   LMP 12/16/2018 (Exact Date)   BMI 33.99 kg/m   Physical Exam Constitutional:      General: She is not in acute distress.    Appearance: Normal appearance.  HENT:     Head: Normocephalic and atraumatic.  Eyes:     General: No scleral icterus.    Conjunctiva/sclera: Conjunctivae normal.  Neck:     Thyroid: No thyroid tenderness.     Comments: Possible right fullness in the left thyroid area Musculoskeletal:     Cervical back: Normal range of motion and neck supple.  Neurological:     General: No focal deficit present.     Mental Status: She is alert and oriented to person, place, and time.     Cranial Nerves: No cranial nerve deficit.  Psychiatric:        Mood and Affect: Mood normal.        Behavior: Behavior normal.        Judgment: Judgment normal.     Consults: None  Significant Findings/ Diagnostic Studies:  Lab Results  Component Value Date   WBC 7.1 08/14/2019   HGB 13.7 08/14/2019   HCT 40.2 08/14/2019   PLT 158 08/14/2019   CREATININE 0.44 08/14/2019   ALT 20 08/14/2019   AST 21 08/14/2019   PROTCRRATIO  too low to calculate      08/14/2019    Lab Results  Component Value Date   TSH 0.638 08/14/2019   FREET4 0.56 (L) 08/14/2019    Procedures:  NST: Baseline FHR: 135 beats/min Variability: moderate Accelerations: present Decelerations: absent Tocometry: quiet  Interpretation:  INDICATIONS: elevated blood pressure in clinic RESULTS:  A NST procedure was performed with FHR monitoring and a normal baseline established, appropriate time of 20-40 minutes of evaluation, and accels >2 seen w 15x15 characteristics.  Results show a REACTIVE NST.     Hospital Course: The patient was admitted to Labor and Delivery  Triage for observation. She had no symptoms. Her blood pressures were all normal.  Her labs were normal as well for preeclampsia.  She did have a slightly abnormal Free T4 (low) in the setting of a normal TSH.  These results along with her enlarged thyroid could be an indicator of at least subclinical hypothyroidism.  I discussed this with her and recommended follow up with thyroid ultrasound and repeat of TSH/FT4 and possible referral to endocrinology.  We will follow her up in clinic in 1 week and try to obtain an ultrasound of her neck  in the mean time.  She was given precautions for preeclampsia/gestational hypertension.    Discharge Condition: stable  Disposition: Discharge disposition: 01-Home or Self Care       Diet: Regular diet  Discharge Activity: Activity as tolerated   Allergies as of 08/14/2019   No Known Allergies     Medication List    TAKE these medications   albuterol 108 (90 Base) MCG/ACT inhaler Commonly known as: VENTOLIN HFA Inhale into the lungs every 6 (six) hours as needed for wheezing or shortness of breath.   loratadine 10 MG tablet Commonly known as: CLARITIN Take 10 mg by mouth daily.   prenatal vitamin w/FE, FA 27-1 MG Tabs tablet Take 1 tablet by mouth daily at 12 noon.      Follow-up Information    Conard Novak, MD. Schedule an appointment as soon as possible for a visit in 1 week(s).   Specialty: Obstetrics and Gynecology Why: Routine prenatal/blood pressure check Contact information: [address] [place] [address] [phone]           Total time spent taking care of this patient: 50 minutes  Signed: Thomasene Mohair, MD  08/14/2019, 1:45 PM

## 2019-08-15 LAB — THYROID PANEL WITH TSH
Free Thyroxine Index: 1 — ABNORMAL LOW (ref 1.2–4.9)
T3 Uptake Ratio: 12 % — ABNORMAL LOW (ref 24–39)
T4, Total: 8.2 ug/dL (ref 4.5–12.0)
TSH: 0.737 u[IU]/mL (ref 0.450–4.500)

## 2019-08-20 ENCOUNTER — Ambulatory Visit
Admission: RE | Admit: 2019-08-20 | Discharge: 2019-08-20 | Disposition: A | Discharge status: Home / Self Care | Payer: Managed Care, Other (non HMO) | Source: Ambulatory Visit | Attending: Obstetrics and Gynecology | Admitting: Obstetrics and Gynecology

## 2019-08-20 ENCOUNTER — Other Ambulatory Visit: Payer: Self-pay

## 2019-08-20 DIAGNOSIS — E02 Subclinical iodine-deficiency hypothyroidism: Secondary | ICD-10-CM | POA: Diagnosis not present

## 2019-08-20 DIAGNOSIS — E039 Hypothyroidism, unspecified: Secondary | ICD-10-CM | POA: Diagnosis present

## 2019-08-20 DIAGNOSIS — E038 Other specified hypothyroidism: Secondary | ICD-10-CM

## 2019-08-22 ENCOUNTER — Other Ambulatory Visit: Payer: Self-pay

## 2019-08-22 ENCOUNTER — Ambulatory Visit (INDEPENDENT_AMBULATORY_CARE_PROVIDER_SITE_OTHER): Payer: Managed Care, Other (non HMO) | Admitting: Obstetrics and Gynecology

## 2019-08-22 ENCOUNTER — Encounter: Payer: Self-pay | Admitting: Obstetrics and Gynecology

## 2019-08-22 VITALS — BP 130/69 | Wt 217.0 lb

## 2019-08-22 DIAGNOSIS — Z3A35 35 weeks gestation of pregnancy: Secondary | ICD-10-CM

## 2019-08-22 DIAGNOSIS — E041 Nontoxic single thyroid nodule: Secondary | ICD-10-CM

## 2019-08-22 DIAGNOSIS — E038 Other specified hypothyroidism: Secondary | ICD-10-CM

## 2019-08-22 DIAGNOSIS — Z3483 Encounter for supervision of other normal pregnancy, third trimester: Secondary | ICD-10-CM

## 2019-08-22 DIAGNOSIS — E039 Hypothyroidism, unspecified: Secondary | ICD-10-CM

## 2019-08-22 DIAGNOSIS — O163 Unspecified maternal hypertension, third trimester: Secondary | ICD-10-CM

## 2019-08-22 LAB — POCT URINALYSIS DIPSTICK OB: POC,PROTEIN,UA: NEGATIVE

## 2019-08-22 NOTE — Progress Notes (Signed)
Routine Prenatal Care Visit  Subjective  Sara Henson is a 19 y.o. G2P0010 at [redacted]w[redacted]d being seen today for ongoing prenatal care.  She is currently monitored for the following issues for this low-risk pregnancy and has Loss of weight; Disordered eating; Supervision of other normal pregnancy, antepartum; Indication for care in labor and delivery, antepartum; Elevated blood pressure affecting pregnancy in third trimester, antepartum; Subclinical hypothyroidism; and Thyroid nodule on their problem list.  ----------------------------------------------------------------------------------- Patient reports no complaints.   Contractions: Not present. Vag. Bleeding: None.  Movement: Present. Leaking Fluid denies.  Denies headaches, visual changes, and RUQ/epigastric pain.  Reviewed thyroid ultrasound. Consider follow up TSH/FT4 2-3 weeks. Consider starting levothyroxine, if still low.  Ultrasound reviewed with patient. Indicates left lower lobe thyroid nodule with characteristics that do not meet criteria for either ultrasound or other follow up.  May consider endocrinology referral postpartum.  ----------------------------------------------------------------------------------- The following portions of the patient's history were reviewed and updated as appropriate: allergies, current medications, past family history, past medical history, past social history, past surgical history and problem list. Problem list updated.  Objective  Blood pressure 130/69, weight 217 lb (98.4 kg), last menstrual period 12/16/2018. Pregravid weight 163 lb (73.9 kg) Total Weight Gain 54 lb (24.5 kg) Urinalysis: Urine Protein Negative  Urine Glucose (!) Moderate (2+)  Fetal Status: Fetal Heart Rate (bpm): 140 Fundal Height: 36 cm Movement: Present  Presentation: Vertex  General:  Alert, oriented and cooperative. Patient is in no acute distress.  Skin: Skin is warm and dry. No rash noted.   Cardiovascular: Normal heart rate  noted  Respiratory: Normal respiratory effort, no problems with respiration noted  Abdomen: Soft, gravid, appropriate for gestational age. Pain/Pressure: Present     Pelvic:  Cervical exam deferred        Extremities: Normal range of motion.  Edema: None  Mental Status: Normal mood and affect. Normal behavior. Normal judgment and thought content.   Assessment   19 y.o. G2P0010 at [redacted]w[redacted]d by  09/22/2019, by Last Menstrual Period presenting for routine prenatal visit  Plan   Pregnancy #2 Problems (from 12/16/18 to present)    Problem Noted Resolved   Thyroid nodule 08/22/2019 by Will Bonnet, MD No   Subclinical hypothyroidism 08/14/2019 by Will Bonnet, MD No   Supervision of other normal pregnancy, antepartum 01/30/2019 by Homero Fellers, MD No   Overview Addendum 07/16/2019  8:32 AM by Malachy Mood, MD      Clinic [facility] Prenatal Labs  Dating By LMP c/w 8w u/s Blood type: A/Positive/-- (09/28 1446)   Genetic Screen 1 Screen:     AFP:      Cystic Fibrosis:  negative  NIPS:   Diploid XX Antibody:Negative (09/28 1446)  Anatomic [place] Marginal placenta, anterior. Female gender Rubella: Immune  Varicella:Immune  GTT 28 wk:  120    RPR: Non Reactive (09/28 1446)   Rhogam  not needed HBsAg: Negative (09/28 1446)   TDaP vaccine 07/16/2019                  HIV: Non Reactive (09/28 1446)   Flu Shot 03/07/2019                             GBS:   Contraception  Pap: Under 21  CBB     CS/VBAC no history   Baby Food Breast   Support Person  Preterm labor symptoms and general obstetric precautions including but not limited to vaginal bleeding, contractions, leaking of fluid and fetal movement were reviewed in detail with the patient. Please refer to After Visit Summary for other counseling recommendations.   - GBS/Aptima next visit.  - continue to monitor BPs - pap postpartum as she is now 19 yo  Return in about 1 week (around 08/29/2019) for  Routine Prenatal Appointment.  Thomasene Mohair, MD, [facility], [facility] 08/22/2019 1:59 PM

## 2019-08-28 ENCOUNTER — Ambulatory Visit (INDEPENDENT_AMBULATORY_CARE_PROVIDER_SITE_OTHER): Payer: Managed Care, Other (non HMO) | Admitting: Obstetrics and Gynecology

## 2019-08-28 ENCOUNTER — Other Ambulatory Visit: Payer: Self-pay

## 2019-08-28 ENCOUNTER — Other Ambulatory Visit (HOSPITAL_COMMUNITY)
Admission: RE | Admit: 2019-08-28 | Discharge: 2019-08-28 | Disposition: A | Discharge status: Home / Self Care | Payer: Managed Care, Other (non HMO) | Source: Ambulatory Visit | Attending: Obstetrics and Gynecology | Admitting: Obstetrics and Gynecology

## 2019-08-28 ENCOUNTER — Encounter: Payer: Self-pay | Admitting: Obstetrics and Gynecology

## 2019-08-28 VITALS — BP 122/80 | Wt 221.0 lb

## 2019-08-28 DIAGNOSIS — Z3403 Encounter for supervision of normal first pregnancy, third trimester: Secondary | ICD-10-CM

## 2019-08-28 DIAGNOSIS — Z3A36 36 weeks gestation of pregnancy: Secondary | ICD-10-CM

## 2019-08-28 LAB — POCT URINALYSIS DIPSTICK OB: POC,PROTEIN,UA: NEGATIVE

## 2019-08-28 NOTE — Progress Notes (Signed)
Routine Prenatal Care Visit  Subjective  Sara Henson is a 19 y.o. G2P0010 at [redacted]w[redacted]d being seen today for ongoing prenatal care.  She is currently monitored for the following issues for this low-risk pregnancy and has Loss of weight; Disordered eating; Supervision of other normal pregnancy, antepartum; Indication for care in labor and delivery, antepartum; Elevated blood pressure affecting pregnancy in third trimester, antepartum; Subclinical hypothyroidism; Thyroid nodule; Bruising; Chest pain; GERD without esophagitis; Heartburn; Murmur; and Tachycardia on their problem list.  ----------------------------------------------------------------------------------- Patient reports no complaints.   Contractions: Irregular. Vag. Bleeding: None.  Movement: Present. Denies leaking of fluid.  ----------------------------------------------------------------------------------- The following portions of the patient's history were reviewed and updated as appropriate: allergies, current medications, past family history, past medical history, past social history, past surgical history and problem list. Problem list updated.   Objective  Blood pressure 122/80, weight 221 lb (100.2 kg), last menstrual period 12/16/2018. Pregravid weight 163 lb (73.9 kg) Total Weight Gain 58 lb (26.3 kg) Urinalysis:      Fetal Status: Fetal Heart Rate (bpm): 133 Fundal Height: 36 cm Movement: Present  Presentation: Vertex  General:  Alert, oriented and cooperative. Patient is in no acute distress.  Skin: Skin is warm and dry. No rash noted.   Cardiovascular: Normal heart rate noted  Respiratory: Normal respiratory effort, no problems with respiration noted  Abdomen: Soft, gravid, appropriate for gestational age. Pain/Pressure: Present     Pelvic:  Cervical exam deferred Dilation: Closed Effacement (%): 0 Station: -3  Extremities: Normal range of motion.  Edema: None  Mental Status: Normal mood and affect. Normal behavior.  Normal judgment and thought content.     Assessment   19 y.o. G2P0010 at [redacted]w[redacted]d by  09/22/2019, by Last Menstrual Period presenting for routine prenatal visit  Plan   Pregnancy #2 Problems (from 12/16/18 to present)    Problem Noted Resolved   Thyroid nodule 08/22/2019 by Conard Novak, MD No   Subclinical hypothyroidism 08/14/2019 by Conard Novak, MD No   Supervision of other normal pregnancy, antepartum 01/30/2019 by Natale Milch, MD No   Overview Addendum 08/28/2019  9:14 AM by Natale Milch, MD      Clinic [facility] Prenatal Labs  Dating By LMP c/w 8w u/s Blood type: A/Positive/-- (09/28 1446)   Genetic Screen Cystic Fibrosis:  negative  NIPS:   Diploid XX Antibody:Negative (09/28 1446)  Anatomic [place] Marginal placenta, anterior. (resolved) Female gender Rubella: Immune  Varicella:Immune  GTT 28 wk:  120    RPR: Non Reactive (09/28 1446)   Rhogam  not needed HBsAg: Negative (09/28 1446)   TDaP vaccine 07/16/2019                  HIV: Non Reactive (09/28 1446)   Flu Shot 03/07/2019                             GBS:  positive bacteruria in pregnancy  Contraception  Pap: Under 21  CBB     CS/VBAC no history   Baby Food Breast   Support Person   Husband               Gestational age appropriate obstetric precautions including but not limited to vaginal bleeding, contractions, leaking of fluid and fetal movement were reviewed in detail with the patient.    Return in about 1 week (around 09/04/2019) for ROB in person.  [name] R  [name] MD [facility], [facility] 08/28/2019, 9:14 AM      Routine Prenatal Care Visit  Subjective  Sara Henson is a 19 y.o. G2P0010 at [redacted]w[redacted]d being seen today for ongoing prenatal care.  She is currently monitored for the following issues for this low-risk pregnancy and has Loss of weight; Disordered eating; Supervision of other normal pregnancy, antepartum; Indication for care in labor and delivery,  antepartum; Elevated blood pressure affecting pregnancy in third trimester, antepartum; Subclinical hypothyroidism; Thyroid nodule; Bruising; Chest pain; GERD without esophagitis; Heartburn; Murmur; and Tachycardia on their problem list.  ----------------------------------------------------------------------------------- Patient reports no complaints.   Contractions: Irregular. Vag. Bleeding: None.  Movement: Present. Denies leaking of fluid.  ----------------------------------------------------------------------------------- The following portions of the patient's history were reviewed and updated as appropriate: allergies, current medications, past family history, past medical history, past social history, past surgical history and problem list. Problem list updated.   Objective  Blood pressure 122/80, weight 221 lb (100.2 kg), last menstrual period 12/16/2018. Pregravid weight 163 lb (73.9 kg) Total Weight Gain 58 lb (26.3 kg) Urinalysis:      Fetal Status: Fetal Heart Rate (bpm): 133 Fundal Height: 36 cm Movement: Present  Presentation: Vertex  General:  Alert, oriented and cooperative. Patient is in no acute distress.  Skin: Skin is warm and dry. No rash noted.   Cardiovascular: Normal heart rate noted  Respiratory: Normal respiratory effort, no problems with respiration noted  Abdomen: Soft, gravid, appropriate for gestational age. Pain/Pressure: Present     Pelvic:  Cervical exam deferred Dilation: Closed Effacement (%): 0 Station: -3  Extremities: Normal range of motion.  Edema: None  Mental Status: Normal mood and affect. Normal behavior. Normal judgment and thought content.     Assessment   19 y.o. G2P0010 at [redacted]w[redacted]d by  09/22/2019, by Last Menstrual Period presenting for routine prenatal visit  Plan   Pregnancy #2 Problems (from 12/16/18 to present)    Problem Noted Resolved   Thyroid nodule 08/22/2019 by Conard Novak, MD No   Subclinical hypothyroidism 08/14/2019 by  Conard Novak, MD No   Supervision of other normal pregnancy, antepartum 01/30/2019 by Natale Milch, MD No   Overview Addendum 08/28/2019  9:14 AM by Natale Milch, MD      Clinic [facility] Prenatal Labs  Dating By LMP c/w 8w u/s Blood type: A/Positive/-- (09/28 1446)   Genetic Screen Cystic Fibrosis:  negative  NIPS:   Diploid XX Antibody:Negative (09/28 1446)  Anatomic [place] Marginal placenta, anterior. (resolved) Female gender Rubella: Immune  Varicella:Immune  GTT 28 wk:  120    RPR: Non Reactive (09/28 1446)   Rhogam  not needed HBsAg: Negative (09/28 1446)   TDaP vaccine 07/16/2019                  HIV: Non Reactive (09/28 1446)   Flu Shot 03/07/2019                             GBS:  positive bacteruria in pregnancy  Contraception  Pap: Under 21  CBB     CS/VBAC no history   Baby Food Breast   Support Person   Husband               Gestational age appropriate obstetric precautions including but not limited to vaginal bleeding, contractions, leaking of fluid and fetal movement were reviewed in detail with the patient.  Return in about 1 week (around 09/04/2019) for ROB in person.  Homero Fellers MD [facility], [facility] 08/28/2019, 9:14 AM

## 2019-08-29 LAB — CERVICOVAGINAL ANCILLARY ONLY
Chlamydia: NEGATIVE
Comment: NEGATIVE
Comment: NORMAL
Neisseria Gonorrhea: NEGATIVE

## 2019-08-31 LAB — CULTURE, BETA STREP (GROUP B ONLY): Strep Gp B Culture: POSITIVE — AB

## 2019-09-02 ENCOUNTER — Other Ambulatory Visit: Payer: Self-pay

## 2019-09-02 ENCOUNTER — Encounter: Payer: Self-pay | Admitting: Emergency Medicine

## 2019-09-02 ENCOUNTER — Emergency Department
Admission: EM | Admit: 2019-09-02 | Discharge: 2019-09-02 | Disposition: A | Discharge status: Home / Self Care | Payer: Managed Care, Other (non HMO) | Attending: Emergency Medicine | Admitting: Emergency Medicine

## 2019-09-02 DIAGNOSIS — Z3A37 37 weeks gestation of pregnancy: Secondary | ICD-10-CM | POA: Insufficient documentation

## 2019-09-02 DIAGNOSIS — O99891 Other specified diseases and conditions complicating pregnancy: Secondary | ICD-10-CM | POA: Diagnosis not present

## 2019-09-02 DIAGNOSIS — Z87891 Personal history of nicotine dependence: Secondary | ICD-10-CM | POA: Insufficient documentation

## 2019-09-02 DIAGNOSIS — R002 Palpitations: Secondary | ICD-10-CM | POA: Diagnosis not present

## 2019-09-02 DIAGNOSIS — J45909 Unspecified asthma, uncomplicated: Secondary | ICD-10-CM | POA: Diagnosis not present

## 2019-09-02 LAB — CBC
HCT: 39.2 % (ref 36.0–46.0)
Hemoglobin: 13.5 g/dL (ref 12.0–15.0)
MCH: 30 pg (ref 26.0–34.0)
MCHC: 34.4 g/dL (ref 30.0–36.0)
MCV: 87.1 fL (ref 80.0–100.0)
Platelets: 169 10*3/uL (ref 150–400)
RBC: 4.5 MIL/uL (ref 3.87–5.11)
RDW: 12.5 % (ref 11.5–15.5)
WBC: 7.2 10*3/uL (ref 4.0–10.5)
nRBC: 0 % (ref 0.0–0.2)

## 2019-09-02 LAB — BASIC METABOLIC PANEL
Anion gap: 9 (ref 5–15)
BUN: 9 mg/dL (ref 6–20)
CO2: 21 mmol/L — ABNORMAL LOW (ref 22–32)
Calcium: 8.9 mg/dL (ref 8.9–10.3)
Chloride: 106 mmol/L (ref 98–111)
Creatinine, Ser: 0.43 mg/dL — ABNORMAL LOW (ref 0.44–1.00)
GFR calc Af Amer: >60 mL/min (ref >60)
GFR calc non Af Amer: >60 mL/min (ref >60)
Glucose, Bld: 145 mg/dL — ABNORMAL HIGH (ref 70–99)
Potassium: 3.7 mmol/L (ref 3.5–5.1)
Sodium: 136 mmol/L (ref 135–145)

## 2019-09-02 LAB — TROPONIN I (HIGH SENSITIVITY)
Troponin I (High Sensitivity): 5 ng/L (ref <18)
Troponin I (High Sensitivity): 4 ng/L (ref <18)

## 2019-09-02 NOTE — ED Triage Notes (Signed)
FIRST NURSE NOTE- here for heart palpations. Is pregnant.  No chest pain.

## 2019-09-02 NOTE — ED Notes (Signed)
Attempted to get fetal heart tones in triage, was able to locate fetal heart rate, but baby moving around unable to keep in place to listen for full minute.  Advised patient we would try again when she can lay on a stretcher.  No irregularities noted when fetal heart tones heard.

## 2019-09-02 NOTE — ED Triage Notes (Addendum)
Pt c/o heart palpitations since last night, has had to come to be monitored for tachycardia in the past.  Pt is [redacted]W[redacted]D, G-2 P-0.  Pt reports she has had some elevated BP readings in the past on occasion.  Goes to [facility]-sees MD.   Pt states she had thyroid nodule and abnormal TSH levels, not currently on meds.

## 2019-09-02 NOTE — ED Provider Notes (Addendum)
[facility] Emergency Department Provider Note  ____________________________________________  Time seen: Approximately 3:33 PM  I have reviewed the triage vital signs and the nursing notes.   HISTORY  Chief Complaint Palpitations    HPI Sara Henson is a 19 y.o. female who presents the emergency department for complaint of palpitations.  Patient is  [redacted] weeks pregnant, G2, P0.  Patient states that for the past several days she has had a slight "fluttering" sensation in her chest.  She has no pain associated with this sensation.  It comes and goes and does not seem to be elicited by any movement or activity.  Patient has had no URI symptoms, headache, visual changes, chest pain, shortness of breath, coughing, abdominal pain.  Patient denies any pregnancy complications with pain, vaginal bleeding or discharge.  Patient sees [facility].  Patient does have a history of intermittent palpitations/tachycardia.  She states that it has been years since she has had her last episode.  Patient denies any new medications.  She does have a new diagnosis of subclinical/subacute hypothyroidism.  Patient had a slightly low T4 but her TSH and ultrasound are reassuring.  Patient is not currently being treated.        Past Medical History:  Diagnosis Date  . Asthma   . GERD (gastroesophageal reflux disease)     Patient Active Problem List   Diagnosis Date Noted  . Thyroid nodule 08/22/2019  . Indication for care in labor and delivery, antepartum 08/14/2019  . Elevated blood pressure affecting pregnancy in third trimester, antepartum 08/14/2019  . Subclinical hypothyroidism 08/14/2019  . Supervision of other normal pregnancy, antepartum 01/30/2019  . GERD without esophagitis 04/30/2016  . Heartburn 04/30/2016  . Loss of weight 01/22/2013  . Disordered eating 01/22/2013  . Bruising 11/13/2012  . Chest pain 02/08/2012  . Murmur 02/08/2012  . Tachycardia 02/08/2012     Past Surgical History:  Procedure Laterality Date  . TONSILLECTOMY    . TYMPANOSTOMY TUBE PLACEMENT      Prior to Admission medications   Medication Sig Start Date End Date Taking? Authorizing Provider  albuterol (PROVENTIL HFA;VENTOLIN HFA) 108 (90 Base) MCG/ACT inhaler Inhale into the lungs every 6 (six) hours as needed for wheezing or shortness of breath.    [provider]  loratadine (CLARITIN) 10 MG tablet Take 10 mg by mouth daily.    [provider]  prenatal vitamin w/FE, FA (PRENATAL 1 + 1) 27-1 MG TABS tablet Take 1 tablet by mouth daily at 12 noon.    [provider]    Allergies Patient has no known allergies.  Family History  Problem Relation Age of Onset  . Skin cancer Father     Social History Social History   Tobacco Use  . Smoking status: Never Smoker  . Smokeless tobacco: Former Systems developer    Types: Chew  Substance Use Topics  . Alcohol use: Yes    Comment: social  . Drug use: No     Review of Systems  Constitutional: No fever/chills Eyes: No visual changes. No discharge ENT: No upper respiratory complaints. Cardiovascular: no chest pain.  Positive for palpitations. Respiratory: no cough. No SOB. Gastrointestinal: No abdominal pain.  No nausea, no vomiting.  No diarrhea.  No constipation. Genitourinary: Negative for dysuria. No hematuria Musculoskeletal: Negative for musculoskeletal pain. Skin: Negative for rash, abrasions, lacerations, ecchymosis. Neurological: Negative for headaches, focal weakness or numbness. 10-point ROS otherwise negative.  ____________________________________________   PHYSICAL EXAM:  VITAL  SIGNS: ED Triage Vitals  Enc Vitals Group     BP 09/02/19 1229 132/72     Pulse Rate 09/02/19 1229 (!) 108     Resp 09/02/19 1229 18     Temp 09/02/19 1229 98.2 F (36.8 C)     Temp Source 09/02/19 1229 Oral     SpO2 09/02/19 1229 97 %     Weight 09/02/19 1240 221 lb (100.2 kg)     Height 09/02/19  1240 5\' 7"  (1.702 m)     Head Circumference --      Peak Flow --      Pain Score 09/02/19 1229 0     Pain Loc --      Pain Edu? --      Excl. in GC? --      Constitutional: Alert and oriented. Well appearing and in no acute distress. Eyes: Conjunctivae are normal. PERRL. EOMI. Head: Atraumatic. ENT:      Ears:       Nose: No congestion/rhinnorhea.      Mouth/Throat: Mucous membranes are moist.  Neck: No stridor.   Hematological/Lymphatic/Immunilogical: No cervical lymphadenopathy. Cardiovascular: Normal rate, regular rhythm. Normal S1 and S2.  Good peripheral circulation. Respiratory: Normal respiratory effort without tachypnea or retractions. Lungs CTAB. Good air entry to the bases with no decreased or absent breath sounds. Gastrointestinal: Bowel sounds 4 quadrants. Soft and nontender to palpation. No guarding or rigidity. No palpable masses. No distention. No CVA tenderness. Musculoskeletal: Full range of motion to all extremities. No gross deformities appreciated. Neurologic:  Normal speech and language. No gross focal neurologic deficits are appreciated.  Skin:  Skin is warm, dry and intact. No rash noted. Psychiatric: Mood and affect are normal. Speech and behavior are normal. Patient exhibits appropriate insight and judgement.   ____________________________________________   LABS (all labs ordered are listed, but only abnormal results are displayed)  Labs Reviewed  BASIC METABOLIC PANEL - Abnormal; Notable for the following components:      Result Value   CO2 21 (*)    Glucose, Bld 145 (*)    Creatinine, Ser 0.43 (*)    All other components within normal limits  CBC  TROPONIN I (HIGH SENSITIVITY)  TROPONIN I (HIGH SENSITIVITY)   ____________________________________________  EKG  ED ECG REPORT I, 11/02/19 Abdulkarim Eberlin,  personally viewed and interpreted this ECG.   Date: 09/02/2019  EKG Time: 1225 hrs.  Rate: 105 bpm  Rhythm: unchanged from previous  tracings, sinus tachycardia  Axis: Normal axis  Intervals:none  ST&T Change: No ST elevation or depression noted  Sinus tachycardia.  No STEMI.  ____________________________________________  RADIOLOGY   No results found.  ____________________________________________    PROCEDURES  Procedure(s) performed:    Procedures    Medications - No data to display   ____________________________________________   INITIAL IMPRESSION / ASSESSMENT AND PLAN / ED COURSE  Pertinent labs & imaging results that were available during my care of the patient were reviewed by me and considered in my medical decision making (see chart for details).  Review of the  [place]RS was performed in accordance of the [facility] prior to dispensing any controlled drugs.           Patient's diagnosis is consistent with palpitations.  Patient presented to emergency department with a sensation of palpitations.  No chest pain.  She is pregnant but denies any pregnancy complications.  Differential included ischemia, STEMI, PE, SVT, tachycardia, infection, pregnancy complication.  Exam at this time is reassuring.  labs, EKG is reassuring.  Patient arrives symptomatic had a heart rate of 105.  Patient has since been in the 70s to 80s in regards to heart rate and is no longer experiencing any palpitations.  At this time I have a low concern for PE.  No imaging at this time.  Patient does endorse drinking less fluid than "I should".  Patient is encouraged to increase her fluid intake.  Patient does have subacute hypothyroidism but I do not feel that this is likely contributing to patient's symptoms.  She has had similar symptoms in the past that resolved on its own.  If symptoms persist, follow-up with OB/GYN cardiology.  No prescriptions at this time.. Patient is given ED precautions to return to the ED for any worsening or new symptoms.     ____________________________________________  FINAL CLINICAL IMPRESSION(S) /  ED DIAGNOSES  Final diagnoses:  Palpitations  [redacted] weeks gestation of pregnancy      NEW MEDICATIONS STARTED DURING THIS VISIT:  ED Discharge Orders    None          This chart was dictated using voice recognition software/Dragon. Despite best efforts to proofread, errors can occur which can change the meaning. Any change was purely unintentional.    Racheal Patches, PA-C 09/02/19 1709    Aedyn Mckeon, Delorise Royals, PA-C 09/02/19 1710    Sharman Cheek, MD 09/03/19 [phone]

## 2019-09-04 NOTE — Patient Instructions (Addendum)
Third Trimester of Pregnancy The third trimester is from week 28 through week 40 (months 7 through 9). The third trimester is a time when the unborn baby (fetus) is growing rapidly. At the end of the ninth month, the fetus is about 20 inches in length and weighs 6-10 pounds. Body changes during your third trimester Your body will continue to go through many changes during pregnancy. The changes vary from woman to woman. During the third trimester:  Your weight will continue to increase. You can expect to gain 25-35 pounds (11-16 kg) by the end of the pregnancy.  You may begin to get stretch marks on your hips, abdomen, and breasts.  You may urinate more often because the fetus is moving lower into your pelvis and pressing on your bladder.  You may develop or continue to have heartburn. This is caused by increased hormones that slow down muscles in the digestive tract.  You may develop or continue to have constipation because increased hormones slow digestion and cause the muscles that push waste through your intestines to relax.  You may develop hemorrhoids. These are swollen veins (varicose veins) in the rectum that can itch or be painful.  You may develop swollen, bulging veins (varicose veins) in your legs.  You may have increased body aches in the pelvis, back, or thighs. This is due to weight gain and increased hormones that are relaxing your joints.  You may have changes in your hair. These can include thickening of your hair, rapid growth, and changes in texture. Some women also have hair loss during or after pregnancy, or hair that feels dry or thin. Your hair will most likely return to normal after your baby is born.  Your breasts will continue to grow and they will continue to become tender. A yellow fluid (colostrum) may leak from your breasts. This is the first milk you are producing for your baby.  Your belly button may stick out.  You may notice more swelling in your hands,  face, or ankles.  You may have increased tingling or numbness in your hands, arms, and legs. The skin on your belly may also feel numb.  You may feel short of breath because of your expanding uterus.  You may have more problems sleeping. This can be caused by the size of your belly, increased need to urinate, and an increase in your body's metabolism.  You may notice the fetus "dropping," or moving lower in your abdomen (lightening).  You may have increased vaginal discharge.  You may notice your joints feel loose and you may have pain around your pelvic bone. What to expect at prenatal visits You will have prenatal exams every 2 weeks until week 36. Then you will have weekly prenatal exams. During a routine prenatal visit:  You will be weighed to make sure you and the baby are growing normally.  Your blood pressure will be taken.  Your abdomen will be measured to track your baby's growth.  The fetal heartbeat will be listened to.  Any test results from the previous visit will be discussed.  You may have a cervical check near your due date to see if your cervix has softened or thinned (effaced).  You will be tested for Group B streptococcus. This happens between 35 and 37 weeks. Your health care provider may ask you:  What your birth plan is.  How you are feeling.  If you are feeling the baby move.  If you have had any abnormal   symptoms, such as leaking fluid, bleeding, severe headaches, or abdominal cramping.  If you are using any tobacco products, including cigarettes, chewing tobacco, and electronic cigarettes.  If you have any questions. Other tests or screenings that may be performed during your third trimester include:  Blood tests that check for low iron levels (anemia).  Fetal testing to check the health, activity level, and growth of the fetus. Testing is done if you have certain medical conditions or if there are problems during the pregnancy.  Nonstress test  (NST). This test checks the health of your baby to make sure there are no signs of problems, such as the baby not getting enough oxygen. During this test, a belt is placed around your belly. The baby is made to move, and its heart rate is monitored during movement. What is false labor? False labor is a condition in which you feel small, irregular tightenings of the muscles in the womb (contractions) that usually go away with rest, changing position, or drinking water. These are called Braxton Hicks contractions. Contractions may last for hours, days, or even weeks before true labor sets in. If contractions come at regular intervals, become more frequent, increase in intensity, or become painful, you should see your health care provider. What are the signs of labor?  Abdominal cramps.  Regular contractions that start at 10 minutes apart and become stronger and more frequent with time.  Contractions that start on the top of the uterus and spread down to the lower abdomen and back.  Increased pelvic pressure and dull back pain.  A watery or bloody mucus discharge that comes from the vagina.  Leaking of amniotic fluid. This is also known as your "water breaking." It could be a slow trickle or a gush. Let your health care provider know if it has a color or strange odor. If you have any of these signs, call your health care provider right away, even if it is before your due date. Follow these instructions at home: Medicines  Follow your health care provider's instructions regarding medicine use. Specific medicines may be either safe or unsafe to take during pregnancy.  Take a prenatal vitamin that contains at least 600 micrograms (mcg) of folic acid.  If you develop constipation, try taking a stool softener if your health care provider approves. Eating and drinking   Eat a balanced diet that includes fresh fruits and vegetables, whole grains, good sources of protein such as meat, eggs, or tofu,  and low-fat dairy. Your health care provider will help you determine the amount of weight gain that is right for you.  Avoid raw meat and uncooked cheese. These carry germs that can cause birth defects in the baby.  If you have low calcium intake from food, talk to your health care provider about whether you should take a daily calcium supplement.  Eat four or five small meals rather than three large meals a day.  Limit foods that are high in fat and processed sugars, such as fried and sweet foods.  To prevent constipation: ? Drink enough fluid to keep your urine clear or pale yellow. ? Eat foods that are high in fiber, such as fresh fruits and vegetables, whole grains, and beans. Activity  Exercise only as directed by your health care provider. Most women can continue their usual exercise routine during pregnancy. Try to exercise for 30 minutes at least 5 days a week. Stop exercising if you experience uterine contractions.  Avoid heavy lifting.  Do   not exercise in extreme heat or humidity, or at high altitudes.  Wear low-heel, comfortable shoes.  Practice good posture.  You may continue to have sex unless your health care provider tells you otherwise. Relieving pain and discomfort  Take frequent breaks and rest with your legs elevated if you have leg cramps or low back pain.  Take warm sitz baths to soothe any pain or discomfort caused by hemorrhoids. Use hemorrhoid cream if your health care provider approves.  Wear a good support bra to prevent discomfort from breast tenderness.  If you develop varicose veins: ? Wear support pantyhose or compression stockings as told by your healthcare provider. ? Elevate your feet for 15 minutes, 3-4 times a day. Prenatal care  Write down your questions. Take them to your prenatal visits.  Keep all your prenatal visits as told by your health care provider. This is important. Safety  Wear your seat belt at all times when driving.  Make  a list of emergency phone numbers, including numbers for family, friends, the hospital, and police and fire departments. General instructions  Avoid cat litter boxes and soil used by cats. These carry germs that can cause birth defects in the baby. If you have a cat, ask someone to clean the litter box for you.  Do not travel far distances unless it is absolutely necessary and only with the approval of your health care provider.  Do not use hot tubs, steam rooms, or saunas.  Do not drink alcohol.  Do not use any products that contain nicotine or tobacco, such as cigarettes and e-cigarettes. If you need help quitting, ask your health care provider.  Do not use any medicinal herbs or unprescribed drugs. These chemicals affect the formation and growth of the baby.  Do not douche or use tampons or scented sanitary pads.  Do not cross your legs for long periods of time.  To prepare for the arrival of your baby: ? Take prenatal classes to understand, practice, and ask questions about labor and delivery. ? Make a trial run to the hospital. ? Visit the hospital and tour the maternity area. ? Arrange for maternity or paternity leave through employers. ? Arrange for family and friends to take care of pets while you are in the hospital. ? Purchase a rear-facing car seat and make sure you know how to install it in your car. ? Pack your hospital bag. ? Prepare the baby's nursery. Make sure to remove all pillows and stuffed animals from the baby's crib to prevent suffocation.  Visit your dentist if you have not gone during your pregnancy. Use a soft toothbrush to brush your teeth and be gentle when you floss. Contact a health care provider if:  You are unsure if you are in labor or if your water has broken.  You become dizzy.  You have mild pelvic cramps, pelvic pressure, or nagging pain in your abdominal area.  You have lower back pain.  You have persistent nausea, vomiting, or  diarrhea.  You have an unusual or bad smelling vaginal discharge.  You have pain when you urinate. Get help right away if:  Your water breaks before 37 weeks.  You have regular contractions less than 5 minutes apart before 37 weeks.  You have a fever.  You are leaking fluid from your vagina.  You have spotting or bleeding from your vagina.  You have severe abdominal pain or cramping.  You have rapid weight loss or weight gain.  You have   shortness of breath with chest pain.  You notice sudden or extreme swelling of your face, hands, ankles, feet, or legs.  Your baby makes fewer than 10 movements in 2 hours.  You have severe headaches that do not go away when you take medicine.  You have vision changes. Summary  The third trimester is from week 28 through week 40, months 7 through 9. The third trimester is a time when the unborn baby (fetus) is growing rapidly.  During the third trimester, your discomfort may increase as you and your baby continue to gain weight. You may have abdominal, leg, and back pain, sleeping problems, and an increased need to urinate.  During the third trimester your breasts will keep growing and they will continue to become tender. A yellow fluid (colostrum) may leak from your breasts. This is the first milk you are producing for your baby.  False labor is a condition in which you feel small, irregular tightenings of the muscles in the womb (contractions) that eventually go away. These are called Braxton Hicks contractions. Contractions may last for hours, days, or even weeks before true labor sets in.  Signs of labor can include: abdominal cramps; regular contractions that start at 10 minutes apart and become stronger and more frequent with time; watery or bloody mucus discharge that comes from the vagina; increased pelvic pressure and dull back pain; and leaking of amniotic fluid. This information is not intended to replace advice given to you by your  health care provider. Make sure you discuss any questions you have with your health care provider. Document Revised: 08/10/2018 Document Reviewed: 05/25/2016 Elsevier Patient Education  2020 [employer]   Pain Relief During Labor and Delivery Many things can cause pain during labor and delivery, including:  Pressure on bones and ligaments due to the baby moving through the pelvis.  Stretching of tissues due to the baby moving through the birth canal.  Muscle tension due to anxiety or nervousness.  The uterus tightening (contracting) and relaxing to help move the baby. There are many ways to deal with the pain of labor and delivery. They include:  Taking prenatal classes. Taking these classes helps you know what to expect during your baby's birth. What you learn will increase your confidence and decrease your anxiety.  Practicing relaxation techniques or doing relaxing activities, such as: ? Focused breathing. ? Meditation. ? Visualization. ? Aroma therapy. ? Listening to your favorite music. ? Hypnosis.  Taking a warm shower or bath (hydrotherapy). This may: ? Provide comfort and relaxation. ? Lessen your perception of pain. ? Decrease the amount of pain medicine needed. ? Decrease the length of labor.  Getting a massage or counterpressure on your back.  Applying warm packs or ice packs.  Changing positions often, moving around, or using a birthing ball.  Getting: ? Pain medicine through an IV or injection into a muscle. ? Pain medicine inserted into your spinal column. ? Injections of sterile water just under the skin on your lower back (intradermal injections). ? Laughing gas (nitrous oxide). Discuss your pain control options with your health care provider during your prenatal visits. Explore the options offered by your hospital or birth center. What kinds of medicine are available? There are two kinds of medicines that can be used to relieve pain during labor and  delivery:  Analgesics. These medicines decrease pain without causing you to lose feeling or the ability to move your muscles.  Anesthetics. These medicines block feeling in the body   and can decrease your ability to move freely. Both of these kinds of medicine can cause minor side effects, such as nausea, trouble concentrating, and sleepiness. They can also decrease the baby's heart rate before birth and affect the baby's breathing rate after birth. For this reason, health care providers are careful about when and how much medicine is given. What are specific medicines and procedures that provide pain relief? Local Anesthetics Local anesthetics are used to numb a small area of the body. They may be used along with another kind of anesthetic or used to numb the nerves of the vagina, cervix, and perineum during the second stage of labor. General Anesthetics General anesthetics cause you to lose consciousness so you do not feel pain. They are usually only used for an emergency cesarean delivery. General anesthetics are given through an IV tube and a mask. Pudendal Block A pudendal block is a form of local anesthetic. It may be used to relieve the pain associated with pushing or stretching of the perineum at the time of delivery or to further numb the perineum. A pudendal block is done by injecting numbing medicine through the vaginal wall into a nerve in the pelvis. Epidural Analgesia Epidural analgesia is given through a flexible IV catheter that is inserted into the lower back. Numbing medicine is delivered continuously to the area near your spinal column nerves (epidural space). After having this type of analgesia, you may be able to move your legs but you most likely will not be able to walk. Depending on the amount of medicine given, you may lose all feeling in the lower half of your body, or you may retain some level of sensation, including the urge to push. Epidural analgesia can be used to provide  pain relief for a vaginal birth. Spinal Block A spinal block is similar to epidural analgesia, but the medicine is injected into the spinal fluid instead of the epidural space. A spinal block is only given once. It starts to relieve pain quickly, but the pain relief lasts only 1-6 hours. Spinal blocks can be used for cesarean deliveries. Combined Spinal-Epidural (CSE) Block A CSE block combines the effects of a spinal block and epidural analgesia. The spinal block works quickly to block all pain. The epidural analgesia provides continuous pain relief, even after the effects of the spinal block have worn off. This information is not intended to replace advice given to you by your health care provider. Make sure you discuss any questions you have with your health care provider. Document Revised: 04/01/2017 Document Reviewed: 09/10/2015 Elsevier Patient Education  2020 [employer] Inc[employer]   

## 2019-09-05 ENCOUNTER — Encounter: Payer: Self-pay | Admitting: Obstetrics

## 2019-09-05 ENCOUNTER — Other Ambulatory Visit: Payer: Self-pay

## 2019-09-05 ENCOUNTER — Ambulatory Visit (INDEPENDENT_AMBULATORY_CARE_PROVIDER_SITE_OTHER): Payer: Managed Care, Other (non HMO) | Admitting: Obstetrics

## 2019-09-05 VITALS — BP 128/68 | Wt 224.0 lb

## 2019-09-05 DIAGNOSIS — Z3A37 37 weeks gestation of pregnancy: Secondary | ICD-10-CM

## 2019-09-05 DIAGNOSIS — Z3403 Encounter for supervision of normal first pregnancy, third trimester: Secondary | ICD-10-CM

## 2019-09-05 LAB — POCT URINALYSIS DIPSTICK OB
Glucose, UA: NEGATIVE
POC,PROTEIN,UA: NEGATIVE

## 2019-09-05 NOTE — Progress Notes (Signed)
Routine Prenatal Care Visit  Subjective  Sara Henson is a 19 y.o. G2P0010 at [redacted]w[redacted]d being seen today for ongoing prenatal care.  She is currently monitored for the following issues for this low-risk pregnancy and has Loss of weight; Disordered eating; Supervision of other normal pregnancy, antepartum; Indication for care in labor and delivery, antepartum; Elevated blood pressure affecting pregnancy in third trimester, antepartum; Subclinical hypothyroidism; Thyroid nodule; Bruising; Chest pain; GERD without esophagitis; Heartburn; Murmur; and Tachycardia on their problem list.  ----------------------------------------------------------------------------------- Patient reports no bleeding and MORE BRAXTON hICKS CONTRACTIONS WITH INCREASING PELVIC PRESSURE..   Contractions: Irregular. Vag. Bleeding: None.  Movement: Present. Leaking Fluid denies. The baby Sara Henson) is moving well. ----------------------------------------------------------------------------------- The following portions of the patient's history were reviewed and updated as appropriate: allergies, current medications, past family history, past medical history, past social history, past surgical history and problem list. Problem list updated.  Objective  Blood pressure 128/68, weight 224 lb (101.6 kg), last menstrual period 12/16/2018. Pregravid weight 163 lb (73.9 kg) Total Weight Gain 61 lb (27.7 kg) Urinalysis: Urine Protein    Urine Glucose    Fetal Status:     Movement: Present     General:  Alert, oriented and cooperative. Patient is in no acute distress.  Skin: Skin is warm and dry. No rash noted.   Cardiovascular: Normal heart rate noted  Respiratory: Normal respiratory effort, no problems with respiration noted  Abdomen: Soft, gravid, appropriate for gestational age. Pain/Pressure: Present     Pelvic:  Cervical exam performed      closed/long/ ballotable, softening. posterior  Extremities: Normal range of motion.       Mental Status: Normal mood and affect. Normal behavior. Normal judgment and thought content.   Assessment   19 y.o. G2P0010 at [redacted]w[redacted]d by  09/22/2019, by Last Menstrual Period presenting for routine prenatal visit Braxton Hicks UCs. GBS +  Plan   Pregnancy #2 Problems (from 12/16/18 to present)    Problem Noted Resolved   Thyroid nodule 08/22/2019 by Conard Novak, MD No   Subclinical hypothyroidism 08/14/2019 by Conard Novak, MD No   Supervision of other normal pregnancy, antepartum 01/30/2019 by Natale Milch, MD No   Overview Addendum 08/28/2019  9:14 AM by Natale Milch, MD      Clinic [facility] Prenatal Labs  Dating By LMP c/w 8w u/s Blood type: A/Positive/-- (09/28 1446)   Genetic Screen Cystic Fibrosis:  negative  NIPS:   Diploid XX Antibody:Negative (09/28 1446)  Anatomic [place] Marginal placenta, anterior. (resolved) Female gender Rubella: Immune  Varicella:Immune  GTT 28 wk:  120    RPR: Non Reactive (09/28 1446)   Rhogam  not needed HBsAg: Negative (09/28 1446)   TDaP vaccine 07/16/2019                  HIV: Non Reactive (09/28 1446)   Flu Shot 03/07/2019                             GBS:  positive bacteruria in pregnancy  Contraception Declines fromal plan Pap: Under 21  CBB     CS/VBAC no history   Baby Food Breast   Support Person   Husband               Term labor symptoms and general obstetric precautions including but not limited to vaginal bleeding, contractions, leaking of fluid and fetal movement were reviewed in  detail with the patient. Please refer to After Visit Summary for other counseling recommendations.   We discussed the usual late in pregnancy discomforts as the baby descends in the pelvis. Reviewed signs of latent and active labor. We also discussed her contraceptive plan. She has not attended CBE classes. Encouraged her to learn via online video. FU in 1 week for ROB. Aware she will need antibiotics for the GBS during  labor.   Imagene Riches, CNM

## 2019-09-05 NOTE — Progress Notes (Signed)
Having more frequent braxton hicks ctx & some pressure since last visit. Desires cx check today.

## 2019-09-09 ENCOUNTER — Observation Stay
Admission: EM | Admit: 2019-09-09 | Discharge: 2019-09-09 | Disposition: A | Discharge status: Home / Self Care | Payer: Managed Care, Other (non HMO) | Attending: Certified Nurse Midwife | Admitting: Certified Nurse Midwife

## 2019-09-09 ENCOUNTER — Encounter: Payer: Self-pay | Admitting: Obstetrics and Gynecology

## 2019-09-09 DIAGNOSIS — Z3689 Encounter for other specified antenatal screening: Secondary | ICD-10-CM

## 2019-09-09 DIAGNOSIS — Z3A38 38 weeks gestation of pregnancy: Secondary | ICD-10-CM | POA: Diagnosis not present

## 2019-09-09 DIAGNOSIS — R1032 Left lower quadrant pain: Secondary | ICD-10-CM | POA: Insufficient documentation

## 2019-09-09 DIAGNOSIS — O26893 Other specified pregnancy related conditions, third trimester: Secondary | ICD-10-CM | POA: Diagnosis not present

## 2019-09-09 DIAGNOSIS — O368130 Decreased fetal movements, third trimester, not applicable or unspecified: Secondary | ICD-10-CM | POA: Diagnosis present

## 2019-09-09 DIAGNOSIS — E041 Nontoxic single thyroid nodule: Secondary | ICD-10-CM

## 2019-09-09 DIAGNOSIS — E039 Hypothyroidism, unspecified: Secondary | ICD-10-CM

## 2019-09-09 DIAGNOSIS — E038 Other specified hypothyroidism: Secondary | ICD-10-CM

## 2019-09-09 DIAGNOSIS — Z3480 Encounter for supervision of other normal pregnancy, unspecified trimester: Secondary | ICD-10-CM

## 2019-09-09 DIAGNOSIS — N949 Unspecified condition associated with female genital organs and menstrual cycle: Secondary | ICD-10-CM

## 2019-09-09 LAB — URINALYSIS, COMPLETE (UACMP) WITH MICROSCOPIC
Bilirubin Urine: NEGATIVE
Glucose, UA: >=500 mg/dL — AB
Hgb urine dipstick: NEGATIVE
Ketones, ur: NEGATIVE mg/dL
Leukocytes,Ua: NEGATIVE
Nitrite: NEGATIVE
Protein, ur: NEGATIVE mg/dL
Specific Gravity, Urine: 1.001 — ABNORMAL LOW (ref 1.005–1.030)
pH: 6 (ref 5.0–8.0)

## 2019-09-09 NOTE — OB Triage Note (Signed)
Pt. Presents in triage this evening with c/o of abd pain since 1200.  Has gravitated to the left this afternoon.  +FM, no VB and no LOF.

## 2019-09-09 NOTE — OB Triage Note (Signed)
Discharge instructions reviewed with pt. Pt verbalized understanding. All questions answered. Pt stable at time of discharge. Discharged home with significant other.

## 2019-09-09 NOTE — Final Progress Note (Signed)
Physician Final Progress Note  Patient ID: SHAQUAYLA KLIMAS MRN: [id number] DOB/AGE: 2000/06/05 19 y.o.  Admit date: 09/09/2019 Admitting provider: Natale Milch, MD Discharge date: 09/09/2019   Admission Diagnoses: IUP at 38wk2d with decreased fetal movement Left lower quadrant pain  Discharge Diagnoses:  Active Problems:   Decreased fetal movement affecting management of pregnancy in third trimester  Reactive non stress test Left round ligament pain  Consults: None  Significant Findings/ Diagnostic Studies:    History of Present Illness:  Chief Complaint:  Left lower quadrant pain radiating to left thigh and decreased fetal movement HPI:  SAMARA STANKOWSKI is a 19 y.o. G11P0010 female with EDC=5/22/201 at [redacted]w[redacted]d dated by LMP.  Her pregnancy has been complicated by GBS bacteruria and a 61# weight gain with her pregnancy.  She presents to L&D for evaluation of above complaints. Reports feeling some cramping and contractions today and later this evening having LLQ pain, particularly with certain movements. Baby not moving as much today, until monitors were applied. She has now felt multiple fetal movements. Denies leakage of fluid, vaginal bleeding and dysuria   Prenatal care site: Prenatal care at [facility] has also been remarkable for  [facility] Prenatal Labs  Dating By LMP c/w 8w u/s Blood type: A/Positive/-- (09/28 1446)   Genetic Screen Cystic Fibrosis:  negative  NIPS:   Diploid XX Antibody:Negative (09/28 1446)  Anatomic [place] Marginal placenta, anterior. (resolved) Female gender Rubella: Immune  Varicella:Immune  GTT 28 wk:  120    RPR: Non Reactive (09/28 1446)   Rhogam  not needed HBsAg: Negative (09/28 1446)   TDaP vaccine 07/16/2019                  HIV: Non Reactive (09/28 1446)   Flu Shot 03/07/2019                             GBS:  positive bacteruria in pregnancy  Contraception none Pap: Under 21  CBB     CS/VBAC no history   Baby Food Breast   Support  Person   Husband           Maternal Medical History:   Past Medical History:  Diagnosis Date  . Asthma   . GERD (gastroesophageal reflux disease)     Past Surgical History:  Procedure Laterality Date  . TONSILLECTOMY    . TYMPANOSTOMY TUBE PLACEMENT      No Known Allergies  Prior to Admission medications   Medication Sig Start Date End Date Taking? Authorizing Provider  loratadine (CLARITIN) 10 MG tablet Take 10 mg by mouth daily.   Yes [provider]  prenatal vitamin w/FE, FA (PRENATAL 1 + 1) 27-1 MG TABS tablet Take 1 tablet by mouth daily at 12 noon.   Yes [provider]  albuterol (PROVENTIL HFA;VENTOLIN HFA) 108 (90 Base) MCG/ACT inhaler Inhale into the lungs every 6 (six) hours as needed for wheezing or shortness of breath.    [provider]      Social History: She  reports that she has never smoked. She has quit using smokeless tobacco.  Her smokeless tobacco use included chew. She reports current alcohol use. She reports that she does not use drugs.  Family History: family history includes Skin cancer in her father.   Review of Systems:   Review of Systems  Constitutional: Negative for chills and fever.  Gastrointestinal: Positive for abdominal pain (  LLQ). Negative for diarrhea, nausea and vomiting.  Genitourinary: Negative for dysuria and hematuria.       Negqative for vaginal bleeding or leakage of water     Physical Exam:  Vital Signs: BP 132/66 (BP Location: Left Arm)   Pulse (!) 103   Temp 98.1 F (36.7 C) (Oral)   Resp 18   Ht 5\' 7"  (1.702 m)   Wt 101.6 kg   LMP 12/16/2018 (Exact Date)   BMI 35.08 kg/m  General: gravid, WF in no acute distress.  HEENT: normocephalic, atraumatic Heart: mild tachycardia Lungs: normal respiratory effort Abdomen: soft, gravid, non-tender except for a local area on left side/border of uterus. Baby LOT Pelvic:   External: Normal external female genitalia  Cervix: Dilation: Closed  / Effacement (%): Thick / Station: -3  Extremities: non-tender, symmetric, trace edema bilaterally.   Neurologic: Alert & oriented x 3.  Psyche: anxious, full affect   NST: baseline 135 with accelerations to 150s to 160s, moderate variability No decelerations. More than 2 accelerations in 20 minutes Toco: irregular mild frequent contractions of inconsistent duration and strength    Assessment:  TEANDRA HARLAN is a 19 y.o. G48P0010 female at [redacted]w[redacted]d with left round ligament pain-has decreased with rest  Can apply heat/ take Tylenol/ rest Reactive NST  Plan: Discharge home Labor precautions  [place] instructions   Procedures: NST  Discharge Condition: stable  Disposition: Discharge disposition: 01-Home or Self Care       Diet: Regular diet  Discharge Activity: Activity as tolerated  Discharge Instructions    Discharge patient   Complete by: As directed    Discharge disposition: 01-Home or Self Care   Discharge patient date: 09/09/2019     Allergies as of 09/09/2019   No Known Allergies     Medication List    TAKE these medications   albuterol 108 (90 Base) MCG/ACT inhaler Commonly known as: VENTOLIN HFA Inhale into the lungs every 6 (six) hours as needed for wheezing or shortness of breath.   loratadine 10 MG tablet Commonly known as: CLARITIN Take 10 mg by mouth daily.   prenatal vitamin w/FE, FA 27-1 MG Tabs tablet Take 1 tablet by mouth daily at 12 noon.        Total time spent taking care of this patient: 20 minutes  Signed: Dalia Heading 09/09/2019, 11:23 PM

## 2019-09-11 ENCOUNTER — Ambulatory Visit (INDEPENDENT_AMBULATORY_CARE_PROVIDER_SITE_OTHER): Payer: Managed Care, Other (non HMO) | Admitting: Obstetrics

## 2019-09-11 ENCOUNTER — Other Ambulatory Visit: Payer: Self-pay

## 2019-09-11 VITALS — BP 126/84 | Wt 223.0 lb

## 2019-09-11 DIAGNOSIS — Z3403 Encounter for supervision of normal first pregnancy, third trimester: Secondary | ICD-10-CM

## 2019-09-11 DIAGNOSIS — Z3A38 38 weeks gestation of pregnancy: Secondary | ICD-10-CM

## 2019-09-11 NOTE — Progress Notes (Signed)
G2P0 at 38 wks3 days for ROB visit. Denies any regular contractions, is having [employer]. Baby is moving well. She denies danger sxs- no headaches, visual changes, or LOF. C/O  Lower back pain, round ligament pain.   Cervix is closed/long/baby is high. Cervix posterior. "End of pregnancy ills"- education provided- encouraged her to try a maternity belt, take tub baths, use massage. Exercises to optimize fetal position described. RTC 1 week.  Mirna Mires, CNM  09/11/2019 3:02 PM

## 2019-09-11 NOTE — Progress Notes (Signed)
Pt had some back and hip pain yesterday. Just some braxton hicks today. Nothing consistent. No vb. No lof.

## 2019-09-11 NOTE — Patient Instructions (Signed)

## 2019-09-12 ENCOUNTER — Telehealth: Payer: Self-pay | Admitting: Certified Nurse Midwife

## 2019-09-12 LAB — URINE CULTURE
Culture: 20000 — AB
Special Requests: NORMAL

## 2019-09-12 MED ORDER — CLINDAMYCIN HCL 300 MG PO CAPS: 300.0000 mg | ORAL_CAPSULE | Freq: Two times a day (BID) | ORAL | 0 refills | Status: AC

## 2019-09-12 NOTE — Telephone Encounter (Signed)
Patient called with urine culture results. Reports having a low grade fever x 1 day (May10) and fatigue, but feeling better now. Urine culture positive for Staph hominis and GBS. Clindamycin 300 mgm BID x 7 days sent to pharmacy. Farrel Conners, CNM

## 2019-09-19 ENCOUNTER — Other Ambulatory Visit: Payer: Self-pay

## 2019-09-19 ENCOUNTER — Ambulatory Visit (INDEPENDENT_AMBULATORY_CARE_PROVIDER_SITE_OTHER): Payer: Managed Care, Other (non HMO) | Admitting: Certified Nurse Midwife

## 2019-09-19 VITALS — BP 126/78 | Wt 228.0 lb

## 2019-09-19 DIAGNOSIS — Z01812 Encounter for preprocedural laboratory examination: Secondary | ICD-10-CM

## 2019-09-19 DIAGNOSIS — Z3483 Encounter for supervision of other normal pregnancy, third trimester: Secondary | ICD-10-CM

## 2019-09-19 DIAGNOSIS — Z3A39 39 weeks gestation of pregnancy: Secondary | ICD-10-CM

## 2019-09-19 LAB — POCT URINALYSIS DIPSTICK OB: POC,PROTEIN,UA: NEGATIVE

## 2019-09-19 NOTE — Progress Notes (Signed)
ROB at 39wk4d: Good FM. Occasional contraction. No vaginal bleeding or leakage of fluid  Weight up 5#. BP 126/78. FH 39 cm FHT WNL/ cephalic Cervix: closed/ 40%/-3/posterior/medium  A: IUP at 39wk4d  P: Labor precautions IOL scheduled for 29 May at 0500 for postdates  Explained use of Cytotec/ foley bulb/ Pitocin in induction process ROB in 1 week Covid test 27 May pre admission  Farrel Conners, [place]

## 2019-09-25 ENCOUNTER — Ambulatory Visit (INDEPENDENT_AMBULATORY_CARE_PROVIDER_SITE_OTHER): Payer: Managed Care, Other (non HMO) | Admitting: Obstetrics

## 2019-09-25 ENCOUNTER — Other Ambulatory Visit: Payer: Self-pay

## 2019-09-25 VITALS — BP 138/70 | Wt 228.0 lb

## 2019-09-25 DIAGNOSIS — Z3A40 40 weeks gestation of pregnancy: Secondary | ICD-10-CM

## 2019-09-25 DIAGNOSIS — Z3480 Encounter for supervision of other normal pregnancy, unspecified trimester: Secondary | ICD-10-CM

## 2019-09-25 DIAGNOSIS — O1213 Gestational proteinuria, third trimester: Secondary | ICD-10-CM

## 2019-09-25 DIAGNOSIS — O1203 Gestational edema, third trimester: Secondary | ICD-10-CM

## 2019-09-25 LAB — POCT URINALYSIS DIPSTICK
Bilirubin, UA: NEGATIVE
Blood, UA: NEGATIVE
Glucose, UA: POSITIVE — AB
Leukocytes, UA: NEGATIVE
Nitrite, UA: NEGATIVE
Protein, UA: POSITIVE — AB
Spec Grav, UA: 1.015 (ref 1.010–1.025)
Urobilinogen, UA: NEGATIVE E.U./dL — AB
pH, UA: 5 (ref 5.0–8.0)

## 2019-09-25 NOTE — Progress Notes (Signed)
Here at 40 weeks 3 days, reporting ample fetal movement, denying regular contractions, LOF  Or vaginal bleeding. One headache yesterday, resolved with eating a meal. Ready for her scheduled IOL on Saturday. BP x 2   WNL. Pitting ankle and pedal edama noted, with 1+ proteinuria. VE: dimple/505/-3, posterior and softening.  POC discussed. UPC sent. Discussed options with Dr. Jean Rosenthal. She will RTC in 2 days for ROB and BP check..IOL is set for 4 days hence. We discussed the possibility that she may get induced sooner should her Bps rise. She verbalized understanding. Careful review of  Induction process, including the use of Cytotec, foley ball , and pitocin.  RTC 2 days. Sxs of PIH reviewed with patient. Mirna Mires, CNM  09/25/2019 3:01 PM

## 2019-09-25 NOTE — Progress Notes (Signed)
ROB Feet swelling and pitting B/P recheck 138/74

## 2019-09-25 NOTE — Patient Instructions (Signed)
Labor Induction  Labor induction is when steps are taken to cause a pregnant woman to begin the labor process. Most women go into labor on their own between 37 weeks and 42 weeks of pregnancy. When this does not happen or when there is a medical need for labor to begin, steps may be taken to induce labor. Labor induction causes a pregnant woman's uterus to contract. It also causes the cervix to soften (ripen), open (dilate), and thin out (efface). Usually, labor is not induced before 39 weeks of pregnancy unless there is a medical reason to do so. Your health care provider will determine if labor induction is needed. Before inducing labor, your health care provider will consider a number of factors, including:  Your medical condition and your baby's.  How many weeks along you are in your pregnancy.  How mature your baby's lungs are.  The condition of your cervix.  The position of your baby.  The size of your birth canal. What are some reasons for labor induction? Labor may be induced if:  Your health or your baby's health is at risk.  Your pregnancy is overdue by 1 week or more.  Your water breaks but labor does not start on its own.  There is a low amount of amniotic fluid around your baby. You may also choose (elect) to have labor induced at a certain time. Generally, elective labor induction is done no earlier than 39 weeks of pregnancy. What methods are used for labor induction? Methods used for labor induction include:  Prostaglandin medicine. This medicine starts contractions and causes the cervix to dilate and ripen. It can be taken by mouth (orally) or by being inserted into the vagina (suppository).  Inserting a small, thin tube (catheter) with a balloon into the vagina and then expanding the balloon with water to dilate the cervix.  Stripping the membranes. In this method, your health care provider gently separates amniotic sac tissue from the cervix. This causes the  cervix to stretch, which in turn causes the release of a hormone called progesterone. The hormone causes the uterus to contract. This procedure is often done during an office visit, after which you will be sent home to wait for contractions to begin.  Breaking the water. In this method, your health care provider uses a small instrument to make a small hole in the amniotic sac. This eventually causes the amniotic sac to break. Contractions should begin after a few hours.  Medicine to trigger or strengthen contractions. This medicine is given through an IV that is inserted into a vein in your arm. Except for membrane stripping, which can be done in a clinic, labor induction is done in the hospital so that you and your baby can be carefully monitored. How long does it take for labor to be induced? The length of time it takes to induce labor depends on how ready your body is for labor. Some inductions can take up to 2-3 days, while others may take less than a day. Induction may take longer if:  You are induced early in your pregnancy.  It is your first pregnancy.  Your cervix is not ready. What are some risks associated with labor induction? Some risks associated with labor induction include:  Changes in fetal heart rate, such as being too high, too low, or irregular (erratic).  Failed induction.  Infection in the mother or the baby.  Increased risk of having a cesarean delivery.  Fetal death.  Breaking off (abruption)   of the placenta from the uterus (rare).  Rupture of the uterus (very rare). When induction is needed for medical reasons, the benefits of induction generally outweigh the risks. What are some reasons for not inducing labor? Labor induction should not be done if:  Your baby does not tolerate contractions.  You have had previous surgeries on your uterus, such as a myomectomy, removal of fibroids, or a vertical scar from a previous cesarean delivery.  Your placenta lies  very low in your uterus and blocks the opening of the cervix (placenta previa).  Your baby is not in a head-down position.  The umbilical cord drops down into the birth canal in front of the baby.  There are unusual circumstances, such as the baby being very early (premature).  You have had more than 2 previous cesarean deliveries. Summary  Labor induction is when steps are taken to cause a pregnant woman to begin the labor process.  Labor induction causes a pregnant woman's uterus to contract. It also causes the cervix to ripen, dilate, and efface.  Labor is not induced before 39 weeks of pregnancy unless there is a medical reason to do so.  When induction is needed for medical reasons, the benefits of induction generally outweigh the risks. This information is not intended to replace advice given to you by your health care provider. Make sure you discuss any questions you have with your health care provider. Document Revised: 04/22/2017 Document Reviewed: 06/02/2016 Elsevier Patient Education  [place]. Balloon Catheter Placement for Cervical Ripening Balloon catheter placement for cervical ripening is a procedure to help your cervix start to soften (ripen) and open (dilate). It is done to prepare your body for labor induction. During this procedure, a thin tube (catheter) is placed through your cervix. A tiny balloon attached to the catheter is inflated with water. Pressure from the balloon is what helps your cervix start to open. Cervical ripening with a balloon catheter can make labor induction shorter and easier. You may have this procedure if:  Your cervix is not ready for labor.  Your health care provider has planned labor induction.  You are not having twins or multiples.  Your baby is in the head-down position.  You do not have any other pregnancy complications that require you to be monitored in the hospital after balloon catheter placement. If your health care  provider has recommended labor induction to stimulate a vaginal birth, this procedure may be started the day before induction. You will go home with the balloon in place and return to start induction in 12-24 hours. You may have this procedure and stay in the hospital so that your progress can be monitored as well. Tell a health care provider about:  Any allergies you have.  All medicines you are taking, including vitamins, herbs, eye drops, creams, and over-the-counter medicines.  Any blood disorders you have.  Any surgeries you have had.  Any medical conditions you have. What are the risks? Generally, this is a safe procedure. However, problems may occur, including:  Infection.  Bleeding.  Cramping or pain.  Difficulty passing urine.  The baby moving from the head-down position to a position with the feet or buttocks down (breech position). What happens before the procedure?  Your health care provider may check your baby's heartbeat (fetal monitoring) before the procedure.  You may be asked to empty your bladder. What happens during the procedure?   You will be positioned on the exam table as if you  if you were having a pelvic exam or Pap test.  Your health care provider may insert a medical instrument into your vagina (speculum) to see your cervix.  Your cervix may be cleaned with a germ-killing solution.  The catheter will be inserted through the opening of your cervix.  A balloon on the end of the catheter will be inflated with sterile water. Some catheters have two balloons, one on each side of the cervix.  Depending on the type of balloon catheter, the end of the catheter may be left free outside your cervix or taped to your leg. The procedure may vary among health care providers and hospitals. What can I expect after the procedure?  After the procedure, it is common to have: ? A feeling of pressure inside your vagina. ? Light vaginal bleeding (spotting).  You  may have fetal monitoring before going home.  You may be sent home with the catheter in place and asked to return to start your induction in about 12-24 hours. Follow these instructions at home:  Take over-the-counter and prescription medicines only as told by your health care provider.  Return to your normal activities at home as told by your health care provider. Ask your health care provider what activities are safe for you. Do not leave home until you return for labor induction.  You may shower at home. Do not take baths, swim, or use a hot tub unless your health care provider approves.  As your cervix opens, your catheter and balloon may fall out before you return for labor induction. Ask your health care provider what you should do if this happens.  Keep all follow-up visits as told by your health care provider. This is important. You will need to return to start induction as told by your health care provider. Contact your health care provider if:  You have chills or a fever.  You have constant pain or cramps (not contractions).  You have trouble passing urine.  Your water breaks.  You have vaginal bleeding that is heavier than spotting.  You have contractions that start to last longer and come closer together (about every 5 minutes).  The balloon catheter falls out before you return to start your induction. Summary  Cervical ripening with placement of a balloon catheter is an outpatient procedure to prepare you for labor induction.  Cervical ripening with a balloon catheter helps your cervix start to open for birth.  You will be positioned on the exam table. The catheter will be inserted through the opening of your cervix. A balloon on the end of the catheter will be inflated with water.  Pressure from the balloon will cause ripening of your cervix. You will go home with the balloon in place and return to start induction in 12-24 hours.  Contact your health care provider  if you have pain, fever, vaginal bleeding, or trouble passing urine. Also contact him or her if your water breaks, you start to go into labor, or your balloon catheter falls out before you return to start your induction. This information is not intended to replace advice given to you by your health care provider. Make sure you discuss any questions you have with your health care provider. Document Revised: 12/16/2017 Document Reviewed: 12/16/2017 Elsevier Patient Education  2020 Elsevier Inc.  

## 2019-09-26 ENCOUNTER — Ambulatory Visit (INDEPENDENT_AMBULATORY_CARE_PROVIDER_SITE_OTHER): Payer: Managed Care, Other (non HMO) | Admitting: Obstetrics and Gynecology

## 2019-09-26 VITALS — BP 130/70 | Wt 228.2 lb

## 2019-09-26 DIAGNOSIS — O163 Unspecified maternal hypertension, third trimester: Secondary | ICD-10-CM

## 2019-09-26 DIAGNOSIS — Z3480 Encounter for supervision of other normal pregnancy, unspecified trimester: Secondary | ICD-10-CM

## 2019-09-26 DIAGNOSIS — Z3A40 40 weeks gestation of pregnancy: Secondary | ICD-10-CM

## 2019-09-26 LAB — POCT URINALYSIS DIPSTICK OB

## 2019-09-26 LAB — PROTEIN / CREATININE RATIO, URINE
Creatinine, Urine: 97.6 mg/dL
Protein, Ur: 7.3 mg/dL
Protein/Creat Ratio: 75 mg/g creat (ref 0–200)

## 2019-09-26 NOTE — Progress Notes (Signed)
Routine Prenatal Care Visit  Subjective  Sara Henson is a 19 y.o. G2P0010 at [redacted]w[redacted]d being seen today for ongoing prenatal care.  She is currently monitored for the following issues for this low-risk pregnancy and has Loss of weight; Disordered eating; Supervision of other normal pregnancy, antepartum; Indication for care in labor and delivery, antepartum; Elevated blood pressure affecting pregnancy in third trimester, antepartum; Subclinical hypothyroidism; Thyroid nodule; Bruising; Chest pain; GERD without esophagitis; Heartburn; Murmur; Tachycardia; and Decreased fetal movement affecting management of pregnancy in third trimester on their problem list.  ----------------------------------------------------------------------------------- Patient reports no complaints.  She denies headaches, vision changes or RUQ pain today.  Contractions: Not present. Vag. Bleeding: None.  Movement: Present. Denies leaking of fluid.  ----------------------------------------------------------------------------------- The following portions of the patient's history were reviewed and updated as appropriate: allergies, current medications, past family history, past medical history, past social history, past surgical history and problem list. Problem list updated.   Objective  Blood pressure 130/70, weight 228 lb 3.2 oz (103.5 kg), last menstrual period 12/16/2018. Pregravid weight 163 lb (73.9 kg) Total Weight Gain 65 lb 3.2 oz (29.6 kg) Urinalysis:      Fetal Status: Fetal Heart Rate (bpm): 140 Fundal Height: 41 cm Movement: Present  Presentation: Vertex  General:  Alert, oriented and cooperative. Patient is in no acute distress.  Skin: Skin is warm and dry. No rash noted.   Cardiovascular: Normal heart rate noted  Respiratory: Normal respiratory effort, no problems with respiration noted  Abdomen: Soft, gravid, appropriate for gestational age. Pain/Pressure: Present     Pelvic:  Cervical exam deferred  Dilation: Closed Effacement (%): 50 Station: -3  Extremities: Normal range of motion.  Edema: Trace  Mental Status: Normal mood and affect. Normal behavior. Normal judgment and thought content.     Assessment   19 y.o. G2P0010 at [redacted]w[redacted]d by  09/22/2019, by Last Menstrual Period presenting for routine prenatal visit  Plan   Pregnancy #2 Problems (from 12/16/18 to present)    Problem Noted Resolved   Thyroid nodule 08/22/2019 by Will Bonnet, MD No   Subclinical hypothyroidism 08/14/2019 by Will Bonnet, MD No   Supervision of other normal pregnancy, antepartum 01/30/2019 by Homero Fellers, MD No   Overview Addendum 09/09/2019 11:34 PM by Dalia Heading, [place] Prenatal Labs  Dating By LMP c/w 8w u/s Blood type: A/Positive/-- (09/28 1446)   Genetic Screen Cystic Fibrosis:  negative  NIPS:   Diploid XX Antibody:Negative (09/28 1446)  Anatomic [place] Marginal placenta, anterior. (resolved) Female gender Rubella: Immune  Varicella:Immune  GTT 28 wk:  120    RPR: Non Reactive (09/28 1446)   Rhogam  not needed HBsAg: Negative (09/28 1446)   TDaP vaccine 07/16/2019                  HIV: Non Reactive (09/28 1446)   Flu Shot 03/07/2019                             GBS:  positive bacteruria in pregnancy  Contraception  Pap: Under 21  CBB     CS/VBAC no history   Baby Food Breast   Support Person   Husband               Recommended checking BP twice a day.  Discussed warning signs of preeclampsia and when to come to the hospital. Patient has  IOL scheduled for this Saturday.  Discussed expectations for IOL.   Gestational age appropriate obstetric precautions including but not limited to vaginal bleeding, contractions, leaking of fluid and fetal movement were reviewed in detail with the patient.    No follow-ups on file.  Natale Milch MD [facility], [facility] 09/26/2019, 10:56 PM

## 2019-09-26 NOTE — Patient Instructions (Signed)
Vaginal Delivery  Vaginal delivery means that you give birth by pushing your baby out of your birth canal (vagina). A team of health care providers will help you before, during, and after vaginal delivery. Birth experiences are unique for every woman and every pregnancy, and birth experiences vary depending on where you choose to give birth. What happens when I arrive at the birth center or hospital? Once you are in labor and have been admitted into the hospital or birth center, your health care provider may:  Review your pregnancy history and any concerns that you have.  Insert an IV into one of your veins. This may be used to give you fluids and medicines.  Check your blood pressure, pulse, temperature, and heart rate (vital signs).  Check whether your bag of water (amniotic sac) has broken (ruptured).  Talk with you about your birth plan and discuss pain control options. Monitoring Your health care provider may monitor your contractions (uterine monitoring) and your baby's heart rate (fetal monitoring). You may need to be monitored:  Often, but not continuously (intermittently).  All the time or for long periods at a time (continuously). Continuous monitoring may be needed if: ? You are taking certain medicines, such as medicine to relieve pain or make your contractions stronger. ? You have pregnancy or labor complications. Monitoring may be done by:  Placing a special stethoscope or a handheld monitoring device on your abdomen to check your baby's heartbeat and to check for contractions.  Placing monitors on your abdomen (external monitors) to record your baby's heartbeat and the frequency and length of contractions.  Placing monitors inside your uterus through your vagina (internal monitors) to record your baby's heartbeat and the frequency, length, and strength of your contractions. Depending on the type of monitor, it may remain in your uterus or on your baby's head until  birth.  Telemetry. This is a type of continuous monitoring that can be done with external or internal monitors. Instead of having to stay in bed, you are able to move around during telemetry. Physical exam Your health care provider may perform frequent physical exams. This may include:  Checking how and where your baby is positioned in your uterus.  Checking your cervix to determine: ? Whether it is thinning out (effacing). ? Whether it is opening up (dilating). What happens during labor and delivery?  Normal labor and delivery is divided into the following three stages: Stage 1  This is the longest stage of labor.  This stage can last for hours or days.  Throughout this stage, you will feel contractions. Contractions generally feel mild, infrequent, and irregular at first. They get stronger, more frequent (about every 2-3 minutes), and more regular as you move through this stage.  This stage ends when your cervix is completely dilated to 4 inches (10 cm) and completely effaced. Stage 2  This stage starts once your cervix is completely effaced and dilated and lasts until the delivery of your baby.  This stage may last from 20 minutes to 2 hours.  This is the stage where you will feel an urge to push your baby out of your vagina.  You may feel stretching and burning pain, especially when the widest part of your baby's head passes through the vaginal opening (crowning).  Once your baby is delivered, the umbilical cord will be clamped and cut. This usually occurs after waiting a period of 1-2 minutes after delivery.  Your baby will be placed on your bare chest (  skin-to-skin contact) in an upright position and covered with a warm blanket. Watch your baby for feeding cues, like rooting or sucking, and help the baby to your breast for his or her first feeding. Stage 3  This stage starts immediately after the birth of your baby and ends after you deliver the placenta.  This stage may  take anywhere from 5 to 30 minutes.  After your baby has been delivered, you will feel contractions as your body expels the placenta and your uterus contracts to control bleeding. What can I expect after labor and delivery?  After labor is over, you and your baby will be monitored closely until you are ready to go home to ensure that you are both healthy. Your health care team will teach you how to care for yourself and your baby.  You and your baby will stay in the same room (rooming in) during your hospital stay. This will encourage early bonding and successful breastfeeding.  You may continue to receive fluids and medicines through an IV.  Your uterus will be checked and massaged regularly (fundal massage).  You will have some soreness and pain in your abdomen, vagina, and the area of skin between your vaginal opening and your anus (perineum).  If an incision was made near your vagina (episiotomy) or if you had some vaginal tearing during delivery, cold compresses may be placed on your episiotomy or your tear. This helps to reduce pain and swelling.  You may be given a squirt bottle to use instead of wiping when you go to the bathroom. To use the squirt bottle, follow these steps: ? Before you urinate, fill the squirt bottle with warm water. Do not use hot water. ? After you urinate, while you are sitting on the toilet, use the squirt bottle to rinse the area around your urethra and vaginal opening. This rinses away any urine and blood. ? Fill the squirt bottle with clean water every time you use the bathroom.  It is normal to have vaginal bleeding after delivery. Wear a sanitary pad for vaginal bleeding and discharge. Summary  Vaginal delivery means that you will give birth by pushing your baby out of your birth canal (vagina).  Your health care provider may monitor your contractions (uterine monitoring) and your baby's heart rate (fetal monitoring).  Your health care provider may  perform a physical exam.  Normal labor and delivery is divided into three stages.  After labor is over, you and your baby will be monitored closely until you are ready to go home. This information is not intended to replace advice given to you by your health care provider. Make sure you discuss any questions you have with your health care provider. Document Revised: 05/24/2017 Document Reviewed: 05/24/2017 Elsevier Patient Education  2020 [employer]  Pain Relief During Labor and Delivery Many things can cause pain during labor and delivery, including:  Pressure on bones and ligaments due to the baby moving through the pelvis.  Stretching of tissues due to the baby moving through the birth canal.  Muscle tension due to anxiety or nervousness.  The uterus tightening (contracting) and relaxing to help move the baby. There are many ways to deal with the pain of labor and delivery. They include:  Taking prenatal classes. Taking these classes helps you know what to expect during your baby's birth. What you learn will increase your confidence and decrease your anxiety.  Practicing relaxation techniques or doing relaxing activities, such as: ? Focused breathing. ?   Meditation. ? Visualization. ? Aroma therapy. ? Listening to your favorite music. ? Hypnosis.  Taking a warm shower or bath (hydrotherapy). This may: ? Provide comfort and relaxation. ? Lessen your perception of pain. ? Decrease the amount of pain medicine needed. ? Decrease the length of labor.  Getting a massage or counterpressure on your back.  Applying warm packs or ice packs.  Changing positions often, moving around, or using a birthing ball.  Getting: ? Pain medicine through an IV or injection into a muscle. ? Pain medicine inserted into your spinal column. ? Injections of sterile water just under the skin on your lower back (intradermal injections). ? Laughing gas (nitrous oxide). Discuss your pain control  options with your health care provider during your prenatal visits. Explore the options offered by your hospital or birth center. What kinds of medicine are available? There are two kinds of medicines that can be used to relieve pain during labor and delivery:  Analgesics. These medicines decrease pain without causing you to lose feeling or the ability to move your muscles.  Anesthetics. These medicines block feeling in the body and can decrease your ability to move freely. Both of these kinds of medicine can cause minor side effects, such as nausea, trouble concentrating, and sleepiness. They can also decrease the baby's heart rate before birth and affect the baby's breathing rate after birth. For this reason, health care providers are careful about when and how much medicine is given. What are specific medicines and procedures that provide pain relief? Local Anesthetics Local anesthetics are used to numb a small area of the body. They may be used along with another kind of anesthetic or used to numb the nerves of the vagina, cervix, and perineum during the second stage of labor. General Anesthetics General anesthetics cause you to lose consciousness so you do not feel pain. They are usually only used for an emergency cesarean delivery. General anesthetics are given through an IV tube and a mask. Pudendal Block A pudendal block is a form of local anesthetic. It may be used to relieve the pain associated with pushing or stretching of the perineum at the time of delivery or to further numb the perineum. A pudendal block is done by injecting numbing medicine through the vaginal wall into a nerve in the pelvis. Epidural Analgesia Epidural analgesia is given through a flexible IV catheter that is inserted into the lower back. Numbing medicine is delivered continuously to the area near your spinal column nerves (epidural space). After having this type of analgesia, you may be able to move your legs but  you most likely will not be able to walk. Depending on the amount of medicine given, you may lose all feeling in the lower half of your body, or you may retain some level of sensation, including the urge to push. Epidural analgesia can be used to provide pain relief for a vaginal birth. Spinal Block A spinal block is similar to epidural analgesia, but the medicine is injected into the spinal fluid instead of the epidural space. A spinal block is only given once. It starts to relieve pain quickly, but the pain relief lasts only 1-6 hours. Spinal blocks can be used for cesarean deliveries. Combined Spinal-Epidural (CSE) Block A CSE block combines the effects of a spinal block and epidural analgesia. The spinal block works quickly to block all pain. The epidural analgesia provides continuous pain relief, even after the effects of the spinal block have worn off. This information   This information is not intended to replace advice given to you by your health care provider. Make sure you discuss any questions you have with your health care provider. Document Revised: 04/01/2017 Document Reviewed: 09/10/2015 Elsevier Patient Education  2020 Elsevier Inc.   Labor Induction  Labor induction is when steps are taken to cause a pregnant woman to begin the labor process. Most women go into labor on their own between 37 weeks and 42 weeks of pregnancy. When this does not happen or when there is a medical need for labor to begin, steps may be taken to induce labor. Labor induction causes a pregnant woman's uterus to contract. It also causes the cervix to soften (ripen), open (dilate), and thin out (efface). Usually, labor is not induced before 39 weeks of pregnancy unless there is a medical reason to do so. Your health care provider will determine if labor induction is needed. Before inducing labor, your health care provider will consider a number of factors, including:  Your medical condition and your baby's.  How many  weeks along you are in your pregnancy.  How mature your baby's lungs are.  The condition of your cervix.  The position of your baby.  The size of your birth canal. What are some reasons for labor induction? Labor may be induced if:  Your health or your baby's health is at risk.  Your pregnancy is overdue by 1 week or more.  Your water breaks but labor does not start on its own.  There is a low amount of amniotic fluid around your baby. You may also choose (elect) to have labor induced at a certain time. Generally, elective labor induction is done no earlier than 39 weeks of pregnancy. What methods are used for labor induction? Methods used for labor induction include:  Prostaglandin medicine. This medicine starts contractions and causes the cervix to dilate and ripen. It can be taken by mouth (orally) or by being inserted into the vagina (suppository).  Inserting a small, thin tube (catheter) with a balloon into the vagina and then expanding the balloon with water to dilate the cervix.  Stripping the membranes. In this method, your health care provider gently separates amniotic sac tissue from the cervix. This causes the cervix to stretch, which in turn causes the release of a hormone called progesterone. The hormone causes the uterus to contract. This procedure is often done during an office visit, after which you will be sent home to wait for contractions to begin.  Breaking the water. In this method, your health care provider uses a small instrument to make a small hole in the amniotic sac. This eventually causes the amniotic sac to break. Contractions should begin after a few hours.  Medicine to trigger or strengthen contractions. This medicine is given through an IV that is inserted into a vein in your arm. Except for membrane stripping, which can be done in a clinic, labor induction is done in the hospital so that you and your baby can be carefully monitored. How long does it  take for labor to be induced? The length of time it takes to induce labor depends on how ready your body is for labor. Some inductions can take up to 2-3 days, while others may take less than a day. Induction may take longer if:  You are induced early in your pregnancy.  It is your first pregnancy.  Your cervix is not ready. What are some risks associated with labor induction? Some risks associated with   labor induction include:  Changes in fetal heart rate, such as being too high, too low, or irregular (erratic).  Failed induction.  Infection in the mother or the baby.  Increased risk of having a cesarean delivery.  Fetal death.  Breaking off (abruption) of the placenta from the uterus (rare).  Rupture of the uterus (very rare). When induction is needed for medical reasons, the benefits of induction generally outweigh the risks. What are some reasons for not inducing labor? Labor induction should not be done if:  Your baby does not tolerate contractions.  You have had previous surgeries on your uterus, such as a myomectomy, removal of fibroids, or a vertical scar from a previous cesarean delivery.  Your placenta lies very low in your uterus and blocks the opening of the cervix (placenta previa).  Your baby is not in a head-down position.  The umbilical cord drops down into the birth canal in front of the baby.  There are unusual circumstances, such as the baby being very early (premature).  You have had more than 2 previous cesarean deliveries. Summary  Labor induction is when steps are taken to cause a pregnant woman to begin the labor process.  Labor induction causes a pregnant woman's uterus to contract. It also causes the cervix to ripen, dilate, and efface.  Labor is not induced before 39 weeks of pregnancy unless there is a medical reason to do so.  When induction is needed for medical reasons, the benefits of induction generally outweigh the risks. This  information is not intended to replace advice given to you by your health care provider. Make sure you discuss any questions you have with your health care provider. Document Revised: 04/22/2017 Document Reviewed: 06/02/2016 Elsevier Patient Education  2020 [employer].

## 2019-09-27 ENCOUNTER — Inpatient Hospital Stay
Admission: EM | Admit: 2019-09-27 | Discharge: 2019-10-02 | DRG: 787 | Disposition: A | Discharge status: Home / Self Care | Payer: Managed Care, Other (non HMO) | Attending: Obstetrics and Gynecology | Admitting: Obstetrics and Gynecology

## 2019-09-27 ENCOUNTER — Other Ambulatory Visit: Payer: Self-pay

## 2019-09-27 ENCOUNTER — Other Ambulatory Visit
Admission: RE | Admit: 2019-09-27 | Discharge: 2019-09-27 | Disposition: A | Discharge status: Home / Self Care | Payer: Managed Care, Other (non HMO) | Source: Ambulatory Visit | Attending: Obstetrics and Gynecology | Admitting: Obstetrics and Gynecology

## 2019-09-27 ENCOUNTER — Encounter: Payer: Self-pay | Admitting: Obstetrics and Gynecology

## 2019-09-27 ENCOUNTER — Encounter: Payer: Managed Care, Other (non HMO) | Admitting: Obstetrics

## 2019-09-27 DIAGNOSIS — Z20822 Contact with and (suspected) exposure to covid-19: Secondary | ICD-10-CM | POA: Diagnosis present

## 2019-09-27 DIAGNOSIS — O9081 Anemia of the puerperium: Secondary | ICD-10-CM | POA: Diagnosis not present

## 2019-09-27 DIAGNOSIS — O134 Gestational [pregnancy-induced] hypertension without significant proteinuria, complicating childbirth: Secondary | ICD-10-CM | POA: Diagnosis present

## 2019-09-27 DIAGNOSIS — D62 Acute posthemorrhagic anemia: Secondary | ICD-10-CM | POA: Diagnosis not present

## 2019-09-27 DIAGNOSIS — O335XX0 Maternal care for disproportion due to unusually large fetus, not applicable or unspecified: Secondary | ICD-10-CM | POA: Diagnosis not present

## 2019-09-27 DIAGNOSIS — O9952 Diseases of the respiratory system complicating childbirth: Secondary | ICD-10-CM | POA: Diagnosis present

## 2019-09-27 DIAGNOSIS — O99824 Streptococcus B carrier state complicating childbirth: Secondary | ICD-10-CM | POA: Diagnosis present

## 2019-09-27 DIAGNOSIS — R030 Elevated blood-pressure reading, without diagnosis of hypertension: Secondary | ICD-10-CM | POA: Diagnosis present

## 2019-09-27 DIAGNOSIS — O133 Gestational [pregnancy-induced] hypertension without significant proteinuria, third trimester: Secondary | ICD-10-CM | POA: Diagnosis not present

## 2019-09-27 DIAGNOSIS — Z3480 Encounter for supervision of other normal pregnancy, unspecified trimester: Secondary | ICD-10-CM

## 2019-09-27 DIAGNOSIS — Z3A40 40 weeks gestation of pregnancy: Secondary | ICD-10-CM

## 2019-09-27 DIAGNOSIS — J45909 Unspecified asthma, uncomplicated: Secondary | ICD-10-CM | POA: Diagnosis present

## 2019-09-27 DIAGNOSIS — O99284 Endocrine, nutritional and metabolic diseases complicating childbirth: Secondary | ICD-10-CM | POA: Diagnosis present

## 2019-09-27 DIAGNOSIS — O480 Post-term pregnancy: Secondary | ICD-10-CM | POA: Diagnosis present

## 2019-09-27 DIAGNOSIS — E038 Other specified hypothyroidism: Secondary | ICD-10-CM

## 2019-09-27 DIAGNOSIS — E02 Subclinical iodine-deficiency hypothyroidism: Secondary | ICD-10-CM | POA: Diagnosis present

## 2019-09-27 DIAGNOSIS — Z01812 Encounter for preprocedural laboratory examination: Secondary | ICD-10-CM | POA: Insufficient documentation

## 2019-09-27 DIAGNOSIS — E041 Nontoxic single thyroid nodule: Secondary | ICD-10-CM

## 2019-09-27 LAB — COMPREHENSIVE METABOLIC PANEL
ALT: 14 IU/L (ref 0–32)
ALT: 15 U/L (ref 0–44)
AST: 20 IU/L (ref 0–40)
AST: 21 U/L (ref 15–41)
Albumin/Globulin Ratio: 1.7 (ref 1.2–2.2)
Albumin: 3.7 g/dL — ABNORMAL LOW (ref 3.9–5.0)
Albumin: 3.1 g/dL — ABNORMAL LOW (ref 3.5–5.0)
Alkaline Phosphatase: 176 IU/L — ABNORMAL HIGH (ref 48–121)
Alkaline Phosphatase: 151 U/L — ABNORMAL HIGH (ref 38–126)
Anion gap: 11 (ref 5–15)
BUN/Creatinine Ratio: 16 (ref 9–23)
BUN: 9 mg/dL (ref 6–20)
BUN: 13 mg/dL (ref 6–20)
Bilirubin Total: 0.2 mg/dL (ref 0.0–1.2)
CO2: 17 mmol/L — ABNORMAL LOW (ref 20–29)
CO2: 21 mmol/L — ABNORMAL LOW (ref 22–32)
Calcium: 9.3 mg/dL (ref 8.7–10.2)
Calcium: 9.3 mg/dL (ref 8.9–10.3)
Chloride: 103 mmol/L (ref 96–106)
Chloride: 104 mmol/L (ref 98–111)
Creatinine, Ser: 0.56 mg/dL — ABNORMAL LOW (ref 0.57–1.00)
Creatinine, Ser: 0.45 mg/dL (ref 0.44–1.00)
GFR calc Af Amer: 154 mL/min/{1.73_m2} (ref >59)
GFR calc Af Amer: >60 mL/min (ref >60)
GFR calc non Af Amer: 134 mL/min/{1.73_m2} (ref >59)
GFR calc non Af Amer: >60 mL/min (ref >60)
Globulin, Total: 2.2 g/dL (ref 1.5–4.5)
Glucose, Bld: 110 mg/dL — ABNORMAL HIGH (ref 70–99)
Glucose: 99 mg/dL (ref 65–99)
Potassium: 4.2 mmol/L (ref 3.5–5.2)
Potassium: 3.9 mmol/L (ref 3.5–5.1)
Sodium: 136 mmol/L (ref 134–144)
Sodium: 136 mmol/L (ref 135–145)
Total Bilirubin: 0.6 mg/dL (ref 0.3–1.2)
Total Protein: 5.9 g/dL — ABNORMAL LOW (ref 6.0–8.5)
Total Protein: 6.3 g/dL — ABNORMAL LOW (ref 6.5–8.1)

## 2019-09-27 LAB — CBC
HCT: 40.8 % (ref 36.0–46.0)
Hematocrit: 42.1 % (ref 34.0–46.6)
Hemoglobin: 14 g/dL (ref 11.1–15.9)
Hemoglobin: 14.1 g/dL (ref 12.0–15.0)
MCH: 30 pg (ref 26.6–33.0)
MCH: 30.4 pg (ref 26.0–34.0)
MCHC: 33.3 g/dL (ref 31.5–35.7)
MCHC: 34.6 g/dL (ref 30.0–36.0)
MCV: 90 fL (ref 79–97)
MCV: 87.9 fL (ref 80.0–100.0)
Platelets: 188 10*3/uL (ref 150–450)
Platelets: 184 10*3/uL (ref 150–400)
RBC: 4.66 x10E6/uL (ref 3.77–5.28)
RBC: 4.64 MIL/uL (ref 3.87–5.11)
RDW: 12.7 % (ref 11.7–15.4)
RDW: 12.6 % (ref 11.5–15.5)
WBC: 7.8 10*3/uL (ref 3.4–10.8)
WBC: 9.8 10*3/uL (ref 4.0–10.5)
nRBC: 0 % (ref 0.0–0.2)

## 2019-09-27 LAB — PROTEIN / CREATININE RATIO, URINE
Creatinine, Urine: 28.8 mg/dL
Creatinine, Urine: 35 mg/dL
Protein, Ur: 6.4 mg/dL
Protein/Creat Ratio: 222 mg/g creat — ABNORMAL HIGH (ref 0–200)
Total Protein, Urine: <6 mg/dL

## 2019-09-27 LAB — RAPID HIV SCREEN (HIV 1/2 AB+AG)
HIV 1/2 Antibodies: NONREACTIVE
HIV-1 P24 Antigen - HIV24: NONREACTIVE

## 2019-09-27 LAB — TYPE AND SCREEN
ABO/RH(D): A POS
Antibody Screen: NEGATIVE

## 2019-09-27 LAB — SARS CORONAVIRUS 2 (TAT 6-24 HRS): SARS Coronavirus 2: NEGATIVE

## 2019-09-27 MED ORDER — OXYTOCIN 40 UNITS IN NORMAL SALINE INFUSION - SIMPLE MED
1.0000 m[IU]/min | INTRAVENOUS | Status: DC
  Administered 2019-09-28: 50 m[IU]/min via INTRAVENOUS
  Administered 2019-09-28: 2 m[IU]/min via INTRAVENOUS
  Filled 2019-09-27: qty 1000

## 2019-09-27 MED ORDER — BUTORPHANOL TARTRATE 1 MG/ML IJ SOLN
2.0000 mg | INTRAMUSCULAR | Status: DC | PRN
  Administered 2019-09-28 (×2): 2 mg via INTRAVENOUS
  Filled 2019-09-27 (×2): qty 2

## 2019-09-27 MED ORDER — LACTATED RINGERS IV SOLN
500.0000 mL | INTRAVENOUS | Status: DC | PRN
  Administered 2019-09-28: 1000 mL via INTRAVENOUS

## 2019-09-27 MED ORDER — LIDOCAINE HCL (PF) 1 % IJ SOLN
30.0000 mL | INTRAMUSCULAR | Status: DC | PRN
  Filled 2019-09-27: qty 30

## 2019-09-27 MED ORDER — SODIUM CHLORIDE 0.9 % IV SOLN
5.0000 10*6.[IU] | Freq: Once | INTRAVENOUS | Status: AC
  Administered 2019-09-27: 5 10*6.[IU] via INTRAVENOUS
  Filled 2019-09-27: qty 5

## 2019-09-27 MED ORDER — OXYTOCIN 40 UNITS IN NORMAL SALINE INFUSION - SIMPLE MED
2.5000 [IU]/h | INTRAVENOUS | Status: DC
  Filled 2019-09-27: qty 1000

## 2019-09-27 MED ORDER — PENICILLIN G 3 MILLION UNITS IVPB - SIMPLE MED
3.0000 10*6.[IU] | INTRAVENOUS | Status: DC
  Administered 2019-09-28 (×4): 3 10*6.[IU] via INTRAVENOUS
  Filled 2019-09-27 (×4): qty 100

## 2019-09-27 MED ORDER — ACETAMINOPHEN 325 MG PO TABS: 650.0000 mg | ORAL_TABLET | ORAL | Status: DC | PRN

## 2019-09-27 MED ORDER — LACTATED RINGERS IV SOLN: INTRAVENOUS | Status: DC

## 2019-09-27 MED ORDER — TERBUTALINE SULFATE 1 MG/ML IJ SOLN: 0.2500 mg | Freq: Once | INTRAMUSCULAR | Status: DC | PRN

## 2019-09-27 MED ORDER — OXYTOCIN BOLUS FROM INFUSION: 500.0000 mL | Freq: Once | INTRAVENOUS | Status: DC

## 2019-09-27 MED ORDER — ONDANSETRON HCL 4 MG/2ML IJ SOLN: 4.0000 mg | Freq: Four times a day (QID) | INTRAMUSCULAR | Status: DC | PRN

## 2019-09-27 MED ORDER — SOD CITRATE-CITRIC ACID 500-334 MG/5ML PO SOLN
30.0000 mL | ORAL | Status: DC | PRN
  Filled 2019-09-27: qty 30

## 2019-09-27 NOTE — OB Triage Note (Signed)
Pt c/o of elevated BP - 146/82 and contractions since this morning denies HA, dizziness, blurry vision or epigastric pain. Stated Positive FM, denies leaking of fluid and also c/o of small vaginal  bleeding. Had SVE in the office yesterday, closed cervix.

## 2019-09-27 NOTE — H&P (Signed)
Sara Henson is an 19 y.o. female.   Chief Complaint: Uterine contractions, gestational hypertension HPI: Patient presents to L&D with complaints of uterine contractions which started this morning. She reports normal fetal movement. She denies leakage of fluid. She reports loss of her mucus plug and a small amount of vaginal bleeding. She denies headache, right upper quadrant pain and vision changes.  Pregnancy complicated by subclinical hypothyroidism and thyroid nodule. Elevated blood pressure at home suggestive of gestational hypertension.   Pregnancy #2 Problems (from 12/16/18 to present)    Problem Noted Resolved   Thyroid nodule 08/22/2019 by Will Bonnet, MD No   Subclinical hypothyroidism 08/14/2019 by Will Bonnet, MD No   Supervision of other normal pregnancy, antepartum 01/30/2019 by Homero Fellers, MD No   Overview Addendum 09/09/2019 11:34 PM by Dalia Heading, [place] Prenatal Labs  Dating By LMP c/w 8w u/s Blood type: A/Positive/-- (09/28 1446)   Genetic Screen Cystic Fibrosis:  negative  NIPS:   Diploid XX Antibody:Negative (09/28 1446)  Anatomic [place] Marginal placenta, anterior. (resolved) Female gender Rubella: Immune  Varicella:Immune  GTT 28 wk:  120    RPR: Non Reactive (09/28 1446)   Rhogam  not needed HBsAg: Negative (09/28 1446)   TDaP vaccine 07/16/2019                  HIV: Non Reactive (09/28 1446)   Flu Shot 03/07/2019                             GBS:  positive bacteruria in pregnancy  Contraception  Pap: Under 21  CBB     CS/VBAC no history   Baby Food Breast   Support Person   Husband               Past Medical History:  Diagnosis Date  . Asthma   . GERD (gastroesophageal reflux disease)     Past Surgical History:  Procedure Laterality Date  . TONSILLECTOMY    . TYMPANOSTOMY TUBE PLACEMENT      Family History  Problem Relation Age of Onset  . Skin cancer Father    Social History:  reports that she has  never smoked. She has quit using smokeless tobacco.  Her smokeless tobacco use included chew. She reports current alcohol use. She reports that she does not use drugs.  Allergies: No Known Allergies  Medications Prior to Admission  Medication Sig Dispense Refill  . albuterol (PROVENTIL HFA;VENTOLIN HFA) 108 (90 Base) MCG/ACT inhaler Inhale into the lungs every 6 (six) hours as needed for wheezing or shortness of breath.    . loratadine (CLARITIN) 10 MG tablet Take 10 mg by mouth daily.    . prenatal vitamin w/FE, FA (PRENATAL 1 + 1) 27-1 MG TABS tablet Take 1 tablet by mouth daily at 12 noon.      Results for orders placed or performed during the hospital encounter of 09/27/19 (from the past 48 hour(s))  SARS CORONAVIRUS 2 (TAT 6-24 HRS) Nasopharyngeal Nasopharyngeal Swab     Status: None   Collection Time: 09/27/19 10:15 AM   Specimen: Nasopharyngeal Swab  Result Value Ref Range   SARS Coronavirus 2 NEGATIVE NEGATIVE    Comment: (NOTE) SARS-CoV-2 target nucleic acids are NOT DETECTED. The SARS-CoV-2 RNA is generally detectable in upper and lower respiratory specimens during the acute phase of infection. Negative results do not preclude  SARS-CoV-2 infection, do not rule out co-infections with other pathogens, and should not be used as the sole basis for treatment or other patient management decisions. Negative results must be combined with clinical observations, patient history, and epidemiological information. The expected result is Negative. Fact Sheet for Patients: HairSlick.no Fact Sheet for Healthcare Providers: quierodirigir.com This test is not yet approved or cleared by the [place] FDA and  has been authorized for detection and/or diagnosis of SARS-CoV-2 by FDA under an Emergency Use Authorization (EUA). This EUA will remain  in effect (meaning this test can be used) for the duration of the COVID-19 declaration  under Section 56 4(b)(1) of the Act, 21 U.S.C. section 360bbb-3(b)(1), unless the authorization is terminated or revoked sooner. Performed at [facility], [address], [place], [place] [address]    No results found.  Review of Systems  Constitutional: Negative for chills and fever.  HENT: Negative for congestion, hearing loss and sinus pain.   Respiratory: Negative for cough, shortness of breath and wheezing.   Cardiovascular: Negative for chest pain, palpitations and leg swelling.  Gastrointestinal: Negative for abdominal pain, constipation, diarrhea, nausea and vomiting.  Genitourinary: Negative for dysuria, flank pain, frequency, hematuria and urgency.  Musculoskeletal: Negative for back pain.  Skin: Negative for rash.  Neurological: Negative for dizziness and headaches.  Psychiatric/Behavioral: Negative for suicidal ideas. The patient is not nervous/anxious.     Blood pressure 131/73, pulse 76, temperature 98.1 F (36.7 C), temperature source Oral, resp. rate 17, height 5\' 7"  (1.702 m), last menstrual period 12/16/2018. Physical Exam  Nursing note and vitals reviewed. Constitutional: She is oriented to person, place, and time. She appears well-developed and well-nourished.  HENT:  Head: Normocephalic and atraumatic.  Cardiovascular: Normal rate and regular rhythm.  Respiratory: Effort normal and breath sounds normal.  GI: Soft. Bowel sounds are normal.  Musculoskeletal:        General: Normal range of motion.  Neurological: She is alert and oriented to person, place, and time.  Skin: Skin is warm and dry.  Psychiatric: She has a normal mood and affect. Her behavior is normal. Judgment and thought content normal.     NST: 140  bpm baseline, moderate variability, 15x15 accelerations, no decelerations. Tocometer : every 2-3 mintues   Assessment/Plan 19 yo [id number] [redacted]w[redacted]d 1. Elevated blood pressure- will monitor, obtain preeclampsia labs 2. Early labor- will  admit for labor augmentation 3. GBS positive- will start PCN 4. Epidural PRN 5. Diet as tolerated. Clears with pitocin or epidural  [redacted]w[redacted]d, MD 09/27/2019, 7:56 PM

## 2019-09-27 NOTE — Plan of Care (Signed)
  Problem: Education: Goal: Knowledge of Childbirth will improve Outcome: Progressing Goal: Ability to make informed decisions regarding treatment and plan of care will improve Outcome: Progressing Goal: Ability to state and carry out methods to decrease the pain will improve Outcome: Progressing   

## 2019-09-27 NOTE — Progress Notes (Signed)
SVE: 1/80/-3 Foley bulb placed and inflated with 60 cc NST: 135 bpm baseline, moderate variability, 15x15 accelerations, no decelerations. Tocometer : every 3-4 minutes  Adelene Idler MD, [facility], [facility] 09/27/2019 9:45 PM

## 2019-09-28 ENCOUNTER — Inpatient Hospital Stay: Payer: Managed Care, Other (non HMO) | Admitting: Anesthesiology

## 2019-09-28 ENCOUNTER — Encounter
Admission: EM | Disposition: A | Discharge status: Home / Self Care | Payer: Self-pay | Source: Home / Self Care | Attending: Obstetrics and Gynecology

## 2019-09-28 ENCOUNTER — Encounter: Payer: Self-pay | Admitting: Obstetrics and Gynecology

## 2019-09-28 DIAGNOSIS — O133 Gestational [pregnancy-induced] hypertension without significant proteinuria, third trimester: Secondary | ICD-10-CM

## 2019-09-28 DIAGNOSIS — O335XX0 Maternal care for disproportion due to unusually large fetus, not applicable or unspecified: Secondary | ICD-10-CM

## 2019-09-28 LAB — RPR: RPR Ser Ql: NONREACTIVE

## 2019-09-28 SURGERY — Surgical Case
Anesthesia: Epidural | Site: Abdomen

## 2019-09-28 MED ORDER — BUPIVACAINE HCL (PF) 0.5 % IJ SOLN
INTRAMUSCULAR | Status: DC | PRN
  Administered 2019-09-28: 10 mL

## 2019-09-28 MED ORDER — FENTANYL CITRATE (PF) 100 MCG/2ML IJ SOLN
INTRAMUSCULAR | Status: AC
  Filled 2019-09-28: qty 2

## 2019-09-28 MED ORDER — PROPOFOL 10 MG/ML IV BOLUS
INTRAVENOUS | Status: AC
  Filled 2019-09-28: qty 20

## 2019-09-28 MED ORDER — DIPHENHYDRAMINE HCL 50 MG/ML IJ SOLN: 12.5000 mg | INTRAMUSCULAR | Status: DC | PRN

## 2019-09-28 MED ORDER — SODIUM CHLORIDE 0.9 % IV SOLN
500.0000 mg | Freq: Once | INTRAVENOUS | Status: AC
  Administered 2019-09-28: 500 mg via INTRAVENOUS
  Filled 2019-09-28: qty 500

## 2019-09-28 MED ORDER — FENTANYL CITRATE (PF) 100 MCG/2ML IJ SOLN
INTRAMUSCULAR | Status: DC | PRN
  Administered 2019-09-28 (×2): 50 ug via INTRAVENOUS

## 2019-09-28 MED ORDER — PHENYLEPHRINE HCL (PRESSORS) 10 MG/ML IV SOLN
INTRAVENOUS | Status: DC | PRN
  Administered 2019-09-28: 100 ug via INTRAVENOUS

## 2019-09-28 MED ORDER — LACTATED RINGERS IV SOLN
500.0000 mL | Freq: Once | INTRAVENOUS | Status: AC
  Administered 2019-09-28: 500 mL via INTRAVENOUS

## 2019-09-28 MED ORDER — EPHEDRINE 5 MG/ML INJ: 10.0000 mg | INTRAVENOUS | Status: DC | PRN

## 2019-09-28 MED ORDER — CEFAZOLIN SODIUM-DEXTROSE 2-4 GM/100ML-% IV SOLN
2.0000 g | INTRAVENOUS | Status: AC
  Administered 2019-09-28: 2 g via INTRAVENOUS
  Filled 2019-09-28: qty 100

## 2019-09-28 MED ORDER — LIDOCAINE 2% (20 MG/ML) 5 ML SYRINGE
INTRAMUSCULAR | Status: DC | PRN
  Administered 2019-09-28: 5 mg via INTRAVENOUS
  Administered 2019-09-28: 10 mg via INTRAVENOUS
  Administered 2019-09-28: 5 mL via INTRAVENOUS

## 2019-09-28 MED ORDER — LIDOCAINE HCL (PF) 1 % IJ SOLN
INTRAMUSCULAR | Status: DC | PRN
  Administered 2019-09-28: 2 mL

## 2019-09-28 MED ORDER — BUPIVACAINE HCL (PF) 0.5 % IJ SOLN
INTRAMUSCULAR | Status: AC
  Filled 2019-09-28: qty 30

## 2019-09-28 MED ORDER — PHENYLEPHRINE 40 MCG/ML (10ML) SYRINGE FOR IV PUSH (FOR BLOOD PRESSURE SUPPORT): 80.0000 ug | PREFILLED_SYRINGE | INTRAVENOUS | Status: DC | PRN

## 2019-09-28 MED ORDER — FENTANYL CITRATE (PF) 100 MCG/2ML IJ SOLN
25.0000 ug | INTRAMUSCULAR | Status: DC | PRN
  Administered 2019-09-29 (×3): 25 ug via INTRAVENOUS

## 2019-09-28 MED ORDER — ONDANSETRON HCL 4 MG/2ML IJ SOLN: 4.0000 mg | Freq: Once | INTRAMUSCULAR | Status: DC | PRN

## 2019-09-28 MED ORDER — SODIUM CHLORIDE 0.9 % IV SOLN
INTRAVENOUS | Status: DC | PRN
  Administered 2019-09-28: 50 mL/h via INTRAVENOUS

## 2019-09-28 MED ORDER — LIDOCAINE-EPINEPHRINE (PF) 1.5 %-1:200000 IJ SOLN
INTRAMUSCULAR | Status: DC | PRN
  Administered 2019-09-28: 3 mL via PERINEURAL

## 2019-09-28 MED ORDER — FENTANYL 2.5 MCG/ML W/ROPIVACAINE 0.15% IN NS 100 ML EPIDURAL (ARMC)
EPIDURAL | Status: AC
  Filled 2019-09-28: qty 100

## 2019-09-28 MED ORDER — SOD CITRATE-CITRIC ACID 500-334 MG/5ML PO SOLN
30.0000 mL | ORAL | Status: AC
  Administered 2019-09-28: 30 mL via ORAL

## 2019-09-28 MED ORDER — OXYTOCIN 10 UNIT/ML IJ SOLN
INTRAMUSCULAR | Status: AC
  Filled 2019-09-28: qty 2

## 2019-09-28 MED ORDER — BUPIVACAINE HCL 0.5 % IJ SOLN
20.0000 mL | INTRAMUSCULAR | Status: DC
  Filled 2019-09-28: qty 20

## 2019-09-28 MED ORDER — FENTANYL 2.5 MCG/ML W/ROPIVACAINE 0.15% IN NS 100 ML EPIDURAL (ARMC)
12.0000 mL/h | EPIDURAL | Status: DC
  Administered 2019-09-28: 12 mL/h via EPIDURAL

## 2019-09-28 MED ORDER — AMMONIA AROMATIC IN INHA
RESPIRATORY_TRACT | Status: AC
  Filled 2019-09-28: qty 10

## 2019-09-28 MED ORDER — BUPIVACAINE HCL (PF) 0.25 % IJ SOLN
INTRAMUSCULAR | Status: DC | PRN
  Administered 2019-09-28 (×2): 3 mL via EPIDURAL

## 2019-09-28 MED ORDER — ONDANSETRON HCL 4 MG/2ML IJ SOLN
INTRAMUSCULAR | Status: DC | PRN
  Administered 2019-09-28: 4 mg via INTRAVENOUS

## 2019-09-28 MED ORDER — MISOPROSTOL 200 MCG PO TABS
ORAL_TABLET | ORAL | Status: AC
  Filled 2019-09-28: qty 4

## 2019-09-28 MED ORDER — BUPIVACAINE 0.25 % ON-Q PUMP DUAL CATH 400 ML
400.0000 mL | INJECTION | Status: DC
  Filled 2019-09-28: qty 400

## 2019-09-28 MED ORDER — LIDOCAINE HCL (PF) 2 % IJ SOLN: INTRAMUSCULAR | Status: DC | PRN

## 2019-09-28 MED ORDER — LIDOCAINE HCL (PF) 2 % IJ SOLN
INTRAMUSCULAR | Status: AC
  Filled 2019-09-28: qty 10

## 2019-09-28 SURGICAL SUPPLY — 32 items
BAG COUNTER SPONGE EZ (MISCELLANEOUS) ×4 IMPLANT
CANISTER SUCT 3000ML PPV (MISCELLANEOUS) ×3 IMPLANT
CATH KIT ON-Q SILVERSOAK 5IN (CATHETERS) ×6 IMPLANT
CHLORAPREP W/TINT 26 (MISCELLANEOUS) ×6 IMPLANT
CLOSURE WOUND 1/2 X4 (GAUZE/BANDAGES/DRESSINGS)
COUNTER SPONGE BAG EZ (MISCELLANEOUS) ×2
DERMABOND ADVANCED (GAUZE/BANDAGES/DRESSINGS) ×4
DERMABOND ADVANCED .7 DNX12 (GAUZE/BANDAGES/DRESSINGS) ×2 IMPLANT
DRSG OPSITE POSTOP 4X10 (GAUZE/BANDAGES/DRESSINGS) IMPLANT
DRSG TELFA 3X8 NADH (GAUZE/BANDAGES/DRESSINGS) IMPLANT
ELECT CAUTERY BLADE 6.4 (BLADE) ×3 IMPLANT
ELECT REM PT RETURN 9FT ADLT (ELECTROSURGICAL) ×3
ELECTRODE REM PT RTRN 9FT ADLT (ELECTROSURGICAL) ×1 IMPLANT
GAUZE SPONGE 4X4 12PLY STRL (GAUZE/BANDAGES/DRESSINGS) IMPLANT
GLOVE BIO SURGEON STRL SZ7 (GLOVE) ×9 IMPLANT
GLOVE INDICATOR 7.5 STRL GRN (GLOVE) ×9 IMPLANT
GOWN STRL REUS W/ TWL LRG LVL3 (GOWN DISPOSABLE) ×3 IMPLANT
GOWN STRL REUS W/TWL LRG LVL3 (GOWN DISPOSABLE) ×6
NS IRRIG 1000ML POUR BTL (IV SOLUTION) ×3 IMPLANT
PACK C SECTION (MISCELLANEOUS) ×3 IMPLANT
PAD OB MATERNITY 4.3X12.25 (PERSONAL CARE ITEMS) ×6 IMPLANT
PAD PREP 24X41 OB/GYN DISP (PERSONAL CARE ITEMS) ×3 IMPLANT
PENCIL SMOKE ULTRAEVAC 22 CON (MISCELLANEOUS) ×3 IMPLANT
SPONGE LAP 18X18 RF (DISPOSABLE) ×3 IMPLANT
STRIP CLOSURE SKIN 1/2X4 (GAUZE/BANDAGES/DRESSINGS) IMPLANT
SUT MNCRL AB 4-0 PS2 18 (SUTURE) ×3 IMPLANT
SUT PDS AB 1 TP1 96 (SUTURE) ×3 IMPLANT
SUT VIC AB 0 CTX 36 (SUTURE) ×6
SUT VIC AB 0 CTX36XBRD ANBCTRL (SUTURE) ×3 IMPLANT
SUT VIC AB 2-0 CT1 27 (SUTURE) ×2
SUT VIC AB 2-0 CT1 36 (SUTURE) ×3 IMPLANT
SUT VIC AB 2-0 CT1 TAPERPNT 27 (SUTURE) ×1 IMPLANT

## 2019-09-28 NOTE — Anesthesia Procedure Notes (Signed)
Date/Time: 09/28/2019 10:17 PM Performed by: Ginger Carne, CRNA Pre-anesthesia Checklist: Patient identified, Emergency Drugs available, Suction available, Patient being monitored and Timeout performed Patient Re-evaluated:Patient Re-evaluated prior to induction Oxygen Delivery Method: Nasal cannula

## 2019-09-28 NOTE — Progress Notes (Signed)
Sara Henson is a 19 y.o. G2P0010 at [redacted]w[redacted]d by ultrasound admitted for early labor and now augmentation.  Subjective:  She denies headaches this morning or any visual changes. Wearing compression stockings for moderate ankle and pedal edema. She slept well through the night after receiving Stadol. No vaginal bleeding ro LOF. Her baby has been moving well.   Objective: BP 140/81   Pulse 81   Temp 97.7 F (36.5 C) (Oral)   Resp 18   Ht 5\' 7"  (1.702 m)   Wt 103.4 kg   LMP 12/16/2018 (Exact Date)   BMI 35.71 kg/m  I/O last 3 completed shifts: In: 1163.4 [I.V.:963.4; IV Piggyback:200] Out: -  No intake/output data recorded. Fundal height is 42 cms. FHT:  FHR: 130 baseline bpm, variability: moderate,  accelerations:  Present,  decelerations:  Absent UC:   Irregular, palpate mild SVE:   Dilation: 5 Effacement (%): 70 Station: -3 Exam by:: [email or web address], MD  Labs: Lab Results  Component Value Date   WBC 9.8 09/27/2019   HGB 14.1 09/27/2019   HCT 40.8 09/27/2019   MCV 87.9 09/27/2019   PLT 184 09/27/2019    Assessment / Plan: early labor, irregular contraction pattern , now for augmentation with pitocin  Excessive weight gain this pregnancy (65 lbs) Borderline Bps.  Labor: to start pitocin infusion to augmen Preeclampsia:  no signs or symptoms of toxicity Fetal Wellbeing:  Category I Pain Control:  Epidural planned I/D:  n/a Anticipated MOD:  NSVD   Will recheck in several hours once she is more uncomfortable and pitocin advanced.  09/29/2019 09/28/2019, 8:36 AM

## 2019-09-28 NOTE — Anesthesia Preprocedure Evaluation (Signed)
Anesthesia Evaluation  Patient identified by MRN, date of birth, ID band Patient awake    Reviewed: Allergy & Precautions, H&P , NPO status , Patient's Chart, lab work & pertinent test results  Airway Mallampati: II   Neck ROM: full    Dental no notable dental hx.    Pulmonary asthma ,    Pulmonary exam normal        Cardiovascular Normal cardiovascular exam     Neuro/Psych negative neurological ROS  negative psych ROS   GI/Hepatic Neg liver ROS, GERD  Controlled,  Endo/Other  Hypothyroidism   Renal/GU negative Renal ROS  negative genitourinary   Musculoskeletal   Abdominal   Peds  Hematology negative hematology ROS (+)   Anesthesia Other Findings   Reproductive/Obstetrics (+) Pregnancy                             Anesthesia Physical Anesthesia Plan  ASA: II  Anesthesia Plan: Epidural   Post-op Pain Management:    Induction:   PONV Risk Score and Plan:   Airway Management Planned:   Additional Equipment:   Intra-op Plan:   Post-operative Plan:   Informed Consent: I have reviewed the patients History and Physical, chart, labs and discussed the procedure including the risks, benefits and alternatives for the proposed anesthesia with the patient or authorized representative who has indicated his/her understanding and acceptance.     Dental Advisory Given  Plan Discussed with: Anesthesiologist and CRNA  Anesthesia Plan Comments:         Anesthesia Quick Evaluation

## 2019-09-28 NOTE — Transfer of Care (Signed)
Immediate Anesthesia Transfer of Care Note  Patient: Sara Henson  Procedure(s) Performed: CESAREAN SECTION (N/A Abdomen)  Patient Location: PACU  Anesthesia Type:Epidural  Level of Consciousness: awake, alert  and oriented  Airway & Oxygen Therapy: Patient Spontanous Breathing  Post-op Assessment: Report given to RN and Post -op Vital signs reviewed and stable  Post vital signs: Reviewed and stable  Last Vitals:  Vitals Value Taken Time  BP 117/50 09/28/19 2351  Temp 37.4 C 09/28/19 2351  Pulse 98 09/28/19 2351  Resp 21 09/28/19 2351  SpO2 98 % 09/28/19 2351    Last Pain:  Vitals:   09/28/19 2351  TempSrc: Oral  PainSc:          Complications: No apparent anesthesia complications

## 2019-09-28 NOTE — Progress Notes (Signed)
   Subjective:  Comfortable epidural in place  Objective:   Vitals: Blood pressure (!) 117/52, pulse 81, temperature 98.3 F (36.8 C), temperature source Oral, resp. rate 16, height 5\' 7"  (1.702 m), weight 103.4 kg, last menstrual period 12/16/2018, SpO2 99 %. General: NAD Abdomen: soft, non-tender Cervical Exam:  Dilation: 5 Effacement (%): 80 Cervical Position: Posterior Station: -2, -3 Presentation: Vertex Exam by:: Schuman MD  FHT: 139, moderate, +accels, no decels Toco: q1-46min  Results for orders placed or performed during the hospital encounter of 09/27/19 (from the past 24 hour(s))  Type and screen     Status: None   Collection Time: 09/27/19 10:13 PM  Result Value Ref Range   ABO/RH(D) A POS    Antibody Screen NEG    Sample Expiration      09/30/2019,2359 Performed at [facility], [address] [place], [place]     Assessment:   19 y.o. G2P0010 [redacted]w[redacted]d IOL postdates, GHTN  Plan:   1) Labor - no cervical change now 4-hrs of adequate MVU's.  The patient was counseled regarding risk and benefits to proceeding with Cesarean section to expedite delivery.  Risk of cesarean section were discussed including risk of bleeding and need for potential intraoperative or postoperative blood transfusion with a rate of approximately 5% quoted for all Cesarean sections, risk of injury to adjacent organs including but not limited to bowl and bladder, the need for additional surgical procedures to address such injuries, and the risk of infection.  The risk of continued attempts at vaginal delivery include but are note limited to worsening fetal or maternal status.  After consideration of options the patient is amenable to proceed with primary cesarean section for delivery.   2) Fetus - cat I tracing  3) GHTN - has been running normotensive since epidural, only mild range pressures to normotensive postpartum  [redacted]w[redacted]d, MD, [facility], [facility] 09/28/2019, 9:42 PM

## 2019-09-28 NOTE — Discharge Summary (Signed)
OB Discharge Summary     Patient Name: Sara Henson DOB: 02-May-2001 MRN: [id number]  Date of admission: 09/27/2019 Delivering MD: Vena Austria   Date of discharge: 10/02/2019  Admitting diagnosis: [redacted] weeks gestation of pregnancy [Z3A.40] Intrauterine pregnancy: [redacted]w[redacted]d     Secondary diagnosis:  Active Problems:   [redacted] weeks gestation of pregnancy   Cephalopelvic disproportion due to unusually large fetus   Gestational hypertension, third trimester  Additional problems: none     Discharge diagnosis: Term Pregnancy Delivered                                                                                                Post partum procedures:routine post op care  Augmentation: AROM, Pitocin and IP Foley  Complications: Hemorrhage>1086mL  Hospital course:  Onset of Labor With Unplanned C/S   19 y.o. yo G2P1011 at [redacted]w[redacted]d was admitted in Latent Labor on 09/27/2019. Patient had a labor course significant for GHTN, augmentation of labor. The patient went for cesarean section due to Arrest of Dilation. Delivery details as follows: Membrane Rupture Time/Date: 2:15 PM ,09/28/2019   Delivery Method:C-Section, Low Transverse  Details of operation can be found in separate operative note. Patient had an uncomplicated postpartum course.  She is ambulating,tolerating a regular diet, passing flatus, and urinating well.  Patient is discharged home in stable condition 09/28/19.  Newborn Data: Birth date:09/28/2019  Birth time:10:41 PM  Gender:Female  Living status:Living  Apgars:8 ,9  Weight:4400 g   Physical exam  Vitals:   09/28/19 1825 09/28/19 1830 09/28/19 1835 09/28/19 1914  BP:    (!) 117/52  Pulse:    81  Resp:    16  Temp:    98.3 F (36.8 C)  TempSrc:    Oral  SpO2: 98% 98% 99%   Weight:      Height:       General: alert, cooperative and no distress Lochia: appropriate Uterine Fundus: firm Incision: Healing well with no significant drainage, Dressing is clean, dry, and  intact DVT Evaluation: No evidence of DVT seen on physical exam. Negative Homan's sign. Labs: Lab Results  Component Value Date   WBC 9.8 09/27/2019   HGB 14.1 09/27/2019   HCT 40.8 09/27/2019   MCV 87.9 09/27/2019   PLT 184 09/27/2019   CMP Latest Ref Rng & Units 09/27/2019  Glucose 70 - 99 mg/dL 782(N)  BUN 6 - 20 mg/dL 13  Creatinine 5.62 - 1.30 mg/dL 8.65  Sodium 784 - 696 mmol/L 136  Potassium 3.5 - 5.1 mmol/L 3.9  Chloride 98 - 111 mmol/L 104  CO2 22 - 32 mmol/L 21(L)  Calcium 8.9 - 10.3 mg/dL 9.3  Total Protein 6.5 - 8.1 g/dL 6.3(L)  Total Bilirubin 0.3 - 1.2 mg/dL 0.6  Alkaline Phos 38 - 126 U/L 151(H)  AST 15 - 41 U/L 21  ALT 0 - 44 U/L 15    Discharge instruction: per After Visit Summary and "Baby and Me Booklet".  After visit meds:  Allergies as of 09/28/2019   No Known Allergies  Diet: routine diet  Activity: Advance as tolerated. Pelvic rest for 6 weeks.   Outpatient follow up:1 week Follow up Appt:No future appointments. Follow up Visit:No follow-ups on file.  Postpartum contraception: fertility awareness and condoms  Newborn Data: Live born female  Birth Weight: 9 lb 11.2 oz (4400 g) APGAR: 8, 9  Newborn Delivery   Birth date/time: 09/28/2019 22:41:00 Delivery type: C-Section, Low Transverse Trial of labor: No C-section categorization: Primary      Baby Feeding: Breast, pumped breastmilk with somee formula supplementation Disposition:home with mother  Imagene Riches, [facility]  10/02/2019 12:15 PM

## 2019-09-28 NOTE — Op Note (Signed)
Preoperative Diagnosis: 1) 19 y.o. G2P1011 at [redacted]w[redacted]d with cephalopelvic disrproportion  Postoperative Diagnosis: 1) 19 y.o. F7P1025 at [redacted]w[redacted]d with cephalopelvic disrproportion  Operation Performed: Primary low transverse C-section via pfannenstiel skin incision  Indication: Patient with 4-hrs of adequate contractions failing to make cervical change concern for for CPD  Anesthesia: .Epidural  Primary Surgeon: Malachy Mood, MD  Assistant: Gigi Gin, CNM   Preoperative Antibiotics: 2g ancef and 500mg  of azithromycin  Estimated Blood Loss: 1100 mL  IV Fluids: 937mL  Urine Output:: 134mL  Drains or Tubes: Foley to gravity drainage, ON-Q catheter system  Implants: none  Specimens Removed: none  Complications: none  Intraoperative Findings:  Normal tubes ovaries and uterus.  Delivery resulted in the birth of a liveborn female, APGAR (1 MIN): 8   APGAR (5 MINS): 9, weight 9lbs 11oz  Patient Condition: stable  Procedure in Detail:  Patient was taken to the operating room were she was administered regional anesthesia.  She was positioned in the supine position, prepped and draped in the  Usual sterile fashion.  Prior to proceeding with the case a time out was performed and the level of anesthetic was checked and noted to be adequate.  Utilizing the scalpel a pfannenstiel skin incision was made 2cm above the pubic symphysis and carried down sharply to the the level of the rectus fascia.  The fascia was incised in the midline using the scalpel and then extended using mayo scissors.  The superior border of the rectus fascia was grasped with two Kocher clamps and the underlying rectus muscles were dissected of the fascia using blunt dissection.  The median raphae was incised using Mayo scissors.   The inferior border of the rectus fascia was dissected of the rectus muscles in a similar fashion.  The midline was identified, the peritoneum was entered bluntly and expanded using manual  tractions.  The uterus was noted to be in a none rotated position.  Next the bladder blade was placed retracting the bladder caudally.  A bladder flap was not created.  A low transverse incision was scored on the lower uterine segment.  The hysterotomy was entered bluntly using the operators finger.  The hysterotomy incision was extended using manual traction.  The operators hand was placed within the hysterotomy position noting the fetus to be within the OA position.  The vertex was grasped, flexed, brought to the incision, and delivered a traumatically using fundal pressure applied by Gigi Gin, CNM.  The remainder of the body delivered with ease.  The infant was suctioned, cord was clamped and cut before handing off to the awaiting neonatologist.  The placenta was delivered using manual extraction.  The uterus was exteriorized, wiped clean of clots and debris using two moist laps.  The hysterotomy was closed using a two layer closure of 0 Vicryl, with the first being a running locked, the second a vertical imbricating.  The uterus was returned to the abdomen.  The peritoneal gutters were wiped clean of clots and debris using two moist laps.  The hysterotomy incision was re-inspected noted to be hemostatic.  The rectus muscles were inspected noted to be hemostatic.  The superior border of the rectus fascia was grasped with a Kocher clamp.  The ON-Q trocars were then placed 4cm above the superior border of the incision and tunneled subfascially.  The introducers were removed and the catheters were threaded through the sleeves after which the sleeves were removed.  The fascia was closed using a looped #1 PDS  in a running fashion taking 1cm by 1cm bites.  The subcutaneous tissue was irrigated using warm saline, hemostasis achieved using the bovie.  The subcutaneous dead space was less than 3cm and was not closed.  The skin was closed using 2-0 Vicryl interrupted subdermal sutures and 4-0 Monocryl in a  subcuticular fashion.  Sponge needle and instrument counts were corrects times two.  The patient tolerated the procedure well and was taken to the recovery room in stable condition.

## 2019-09-28 NOTE — Progress Notes (Signed)
Sara Henson is a 19 y.o. G2P0010 at [redacted]w[redacted]d by ultrasound admitted for labor who is now undergoing augmentation with pitocin.  Subjective:  Sara Henson has been sleeping since receiving a second dose of Stadol IV.  She describes her contractions as feeling like pressure.    Objective: BP 114/60   Pulse 66   Temp 98.1 F (36.7 C) (Oral)   Resp 16   Ht 5\' 7"  (1.702 m)   Wt 103.4 kg   LMP 12/16/2018 (Exact Date)   BMI 35.71 kg/m  I/O last 3 completed shifts: In: 1163.4 [I.V.:963.4; IV Piggyback:200] Out: -  No intake/output data recorded.  FHT:  FHR: 130 baseline bpm, variability: minimal ,  accelerations:  Present,  decelerations:  Absent UC:   irregular, every 5 minutes with a doubling pattern noted.  Pitocin is a 10 mu/min SVE:   Dilation: 5 Effacement (%): 70 Station: -3 Exam by:: Sara Henson, RNC  Labs: Lab Results  Component Value Date   WBC 9.8 09/27/2019   HGB 14.1 09/27/2019   HCT 40.8 09/27/2019   MCV 87.9 09/27/2019   PLT 184 09/27/2019    Assessment / Plan: Augmentation of labor, progressing well  Anticipate larger baby as fundal height is 42 cms. TWG this pregnancy is 65 lbs.  Labor: Progressing on Pitocin, will continue to increase then AROM Preeclampsia:  NA Fetal Wellbeing:  Category II Pain Control:  IV pain meds I/D:  n/a Anticipated MOD:  NSVD  POC discussed with Dr. 09/29/2019 who agrees with plan.  Sara Henson, CNM  09/28/2019 1:16 PM     09/30/2019 09/28/2019, 1:11 PM

## 2019-09-28 NOTE — Progress Notes (Signed)
Patient feeling well. She was able to sleep with Stadol. She feels like her contractions have spaced out. SVE 4-5/70/-3 Foley bulb out overnight. NST: 130 bpm baseline, moderate variability, 15 x 15 accelerations, no decelerations. Tocometer : every 5-10 minutes Will let patient eat breakfast and then will begin pitocin for labor augmentation.  Adelene Idler MD, [facility], [facility] 09/28/2019 7:29 AM

## 2019-09-28 NOTE — Progress Notes (Signed)
Sara Henson is a 19 y.o. G2P0010 at [redacted]w[redacted]d by ultrasound admitted for induction of labor due to sone elevated Bps, postdates.  Subjective:   Objective: BP 116/62 (BP Location: Left Arm)   Pulse 74   Temp 98.1 F (36.7 C) (Oral)   Resp 16   Ht 5\' 7"  (1.702 m)   Wt 103.4 kg   LMP 12/16/2018 (Exact Date)   SpO2 99%   BMI 35.71 kg/m  I/O last 3 completed shifts: In: 1163.4 [I.V.:963.4; IV Piggyback:200] Out: -  Total I/O In: 2672.6 [P.O.:500; I.V.:1972.6; IV Piggyback:200] Out: 375 [Urine:375]  FHT:  FHR: 130 baseline bpm, variability: moderate,  accelerations:  Present,  decelerations:  Absent UC:   regular, every 2-3 minutes. IUPC in place. MVUs 220 SVE:   Dilation: 5 Effacement (%): 90 Station: -2, -3 Exam by:: M.Kylee Nardozzi, CNM  Labs: Lab Results  Component Value Date   WBC 9.8 09/27/2019   HGB 14.1 09/27/2019   HCT 40.8 09/27/2019   MCV 87.9 09/27/2019   PLT 184 09/27/2019    Assessment / Plan: Protracted active phase Pitocin augmentation continues  Labor: No cervical changes over several hours of pitocin infusion. IPC monitoring of contraction strength Preeclampsia:  no signs or symptoms of toxicity Fetal Wellbeing:  Category I Pain Control:  Epidural I/D:  n/a Anticipated MOD:  Will re evaluate for any albor progress in two hours to determine mode of delivery  Discussed with patient and her husband that her labor is adequate in terms of contraction strength. Should she make no cervical change in two more hours , will discuss operative delivery. Dr. 09/29/2019 updated on maternal/fetal status.  Bonney Aid 09/28/2019, 5:50 PM

## 2019-09-28 NOTE — H&P (Signed)
Subjective:  Comfortable epidural in place  Objective:   Vitals: Blood pressure (!) 117/52, pulse 81, temperature 98.3 F (36.8 C), temperature source Oral, resp. rate 16, height 5\' 7"  (1.702 m), weight 103.4 kg, last menstrual period 12/16/2018, SpO2 99 %. General: NAD Abdomen: gravid, non-tender Cervical Exam:  Dilation: 5 Effacement (%): 80 Cervical Position: Posterior Station: -2, -3 Presentation: Vertex Exam by:: Schuman MD  FHT: 130, moderate, +accels, no decels Toco: q1-46min  Results for orders placed or performed during the hospital encounter of 09/27/19 (from the past 24 hour(s))  CBC     Status: None   Collection Time: 09/27/19  8:26 PM  Result Value Ref Range   WBC 9.8 4.0 - 10.5 K/uL   RBC 4.64 3.87 - 5.11 MIL/uL   Hemoglobin 14.1 12.0 - 15.0 g/dL   HCT 09/29/19 78.2 - 95.6 %   MCV 87.9 80.0 - 100.0 fL   MCH 30.4 26.0 - 34.0 pg   MCHC 34.6 30.0 - 36.0 g/dL   RDW 21.3 08.6 - 57.8 %   Platelets 184 150 - 400 K/uL   nRBC 0.0 0.0 - 0.2 %  RPR     Status: None   Collection Time: 09/27/19  8:26 PM  Result Value Ref Range   RPR Ser Ql NON REACTIVE NON REACTIVE  Protein / creatinine ratio, urine     Status: None   Collection Time: 09/27/19  8:26 PM  Result Value Ref Range   Creatinine, Urine 35 mg/dL   Total Protein, Urine <6 mg/dL   Protein Creatinine Ratio        0.00 - 0.15 mg/mg[Cre]  Comprehensive metabolic panel     Status: Abnormal   Collection Time: 09/27/19  8:26 PM  Result Value Ref Range   Sodium 136 135 - 145 mmol/L   Potassium 3.9 3.5 - 5.1 mmol/L   Chloride 104 98 - 111 mmol/L   CO2 21 (L) 22 - 32 mmol/L   Glucose, Bld 110 (H) 70 - 99 mg/dL   BUN 13 6 - 20 mg/dL   Creatinine, Ser 09/29/19 0.44 - 1.00 mg/dL   Calcium 9.3 8.9 - 6.29 mg/dL   Total Protein 6.3 (L) 6.5 - 8.1 g/dL   Albumin 3.1 (L) 3.5 - 5.0 g/dL   AST 21 15 - 41 U/L   ALT 15 0 - 44 U/L   Alkaline Phosphatase 151 (H) 38 - 126 U/L   Total Bilirubin 0.6 0.3 - 1.2 mg/dL   GFR calc non  Af Amer >60 >60 mL/min   GFR calc Af Amer >60 >60 mL/min   Anion gap 11 5 - 15  Rapid HIV screen (HIV 1/2 Ab+Ag) ([facility] Only)     Status: None   Collection Time: 09/27/19  9:15 PM  Result Value Ref Range   HIV-1 P24 Antigen - HIV24 NON REACTIVE NON REACTIVE   HIV 1/2 Antibodies NON REACTIVE NON REACTIVE   Interpretation (HIV Ag Ab)      A non reactive test result means that HIV 1 or HIV 2 antibodies and HIV 1 p24 antigen were not detected in the specimen.  Type and screen     Status: None (Preliminary result)   Collection Time: 09/27/19  9:15 PM  Result Value Ref Range   ABO/RH(D) PENDING    Antibody Screen PENDING    Sample Expiration      09/30/2019,2359 Performed at [facility], [address], [place], [place]   Type and screen  Status: None   Collection Time: 09/27/19 10:13 PM  Result Value Ref Range   ABO/RH(D) A POS    Antibody Screen NEG    Sample Expiration      09/30/2019,2359 Performed at [facility], [address] [place], [place] [address]     Assessment:   19 y.o. G2P0010 [redacted]w[redacted]d IOL GHTN/postdates  Plan:   1) Labor - no cervical change over the past 2-hr despite adequate MVU.  Discussed the three P's.  I suspect a larger fetus given 29.6 kg weight gain this pregnancy.  1-hr of 120 mg/dL.  The patient wishes to continue trial of labor for additional 2-hrs prior to making a decision on proceeding with cesarean section.  Will recheck at 0830  2) Fetus - Cat I tracing  3) GHTN - currently normotensive  Malachy Mood, MD, [place] 09/28/2019, 7:33 PM

## 2019-09-28 NOTE — Anesthesia Procedure Notes (Signed)
Epidural Patient location during procedure: OB Start time: 09/28/2019 3:17 PM End time: 09/28/2019 3:28 PM  Staffing Anesthesiologist: Yevette Edwards, MD Resident/CRNA: Rosanne Gutting, CRNA Performed: resident/CRNA   Preanesthetic Checklist Completed: patient identified, IV checked, site marked, risks and benefits discussed, surgical consent, monitors and equipment checked, pre-op evaluation and timeout performed  Epidural Patient position: sitting Prep: ChloraPrep Patient monitoring: heart rate, continuous pulse ox and blood pressure Approach: midline Location: L3-L4 Injection technique: LOR saline  Needle:  Needle type: Tuohy  Needle gauge: 17 G Needle length: 9 cm and 9 Needle insertion depth: 6 cm Catheter type: closed end flexible Catheter size: 19 Gauge Catheter at skin depth: 12 cm Test dose: negative and 1.5% lidocaine with Epi 1:200 K  Assessment Sensory level: T10 Events: blood not aspirated, injection not painful, no injection resistance, no paresthesia and negative IV test  Additional Notes 1 attempt Pt. Evaluated and documentation done after procedure finished. Patient identified. Risks/Benefits/Options discussed with patient including but not limited to bleeding, infection, nerve damage, paralysis, failed block, incomplete pain control, headache, blood pressure changes, nausea, vomiting, reactions to medication both or allergic, itching and postpartum back pain. Confirmed with bedside nurse the patient's most recent platelet count. Confirmed with patient that they are not currently taking any anticoagulation, have any bleeding history or any family history of bleeding disorders. Patient expressed understanding and wished to proceed. All questions were answered. Sterile technique was used throughout the entire procedure. Please see nursing notes for vital signs. Test dose was given through epidural catheter and negative prior to continuing to dose epidural or start  infusion. Warning signs of high block given to the patient including shortness of breath, tingling/numbness in hands, complete motor block, or any concerning symptoms with instructions to call for help. Patient was given instructions on fall risk and not to get out of bed. All questions and concerns addressed with instructions to call with any issues or inadequate analgesia.   Patient tolerated the insertion well without immediate complications.Reason for block:procedure for pain

## 2019-09-28 NOTE — Progress Notes (Signed)
Sara Henson is a 19 y.o. G2P0010 at [redacted]w[redacted]d by ultrasound admitted for induction of labor due to some rising Bps and post dates..  Subjective: She is feeling her contractions, describing thm as more painful now. She feels increasing pelvic pressure.her husband is present and supportive at the bedside.  Objective: BP (!) 146/78 (BP Location: Left Arm)   Pulse 87   Temp 97.7 F (36.5 C) (Oral)   Resp 16   Ht 5\' 7"  (1.702 m)   Wt 103.4 kg   LMP 12/16/2018 (Exact Date)   BMI 35.71 kg/m  I/O last 3 completed shifts: In: 1163.4 [I.V.:963.4; IV Piggyback:200] Out: -  No intake/output data recorded.  FHT:  FHR: 130 baseline bpm, variability: minimal ,  accelerations:  Present,  decelerations:  Absent UC:   regular, every 2-4 minutes, moderate to touch.  Pitocin at 12 mu/min SVE:   Dilation: 5 Effacement (%): 70 Station: -2, -3 Exam by:: M.Sara Henson, CNM  Labs: Lab Results  Component Value Date   WBC 9.8 09/27/2019   HGB 14.1 09/27/2019   HCT 40.8 09/27/2019   MCV 87.9 09/27/2019   PLT 184 09/27/2019    Assessment / Plan: Augmentation of labor, progressing   Labor: some progress, with pitocin augmentation ongoing. Preeclampsia:  no signs or symptoms of toxicity Fetal Wellbeing:  Category II Pain Control:  IV pain meds and and plans epidural once more uncomfortable I/D:  n/a Anticipated MOD:  NSVD   AROM at 1415- clear fluid seen. Position changes, use of the rocker or standing next ot bed encouraged. Will recheck in several hours or should patient ask for an epidural. Dr. 09/29/2019 updated on maternal/fetal status. Sara Henson, CNM  09/28/2019 2:49 PM   09/30/2019 Sara Henson 09/28/2019, 2:34 PM

## 2019-09-29 ENCOUNTER — Encounter: Payer: Self-pay | Admitting: Obstetrics and Gynecology

## 2019-09-29 LAB — CBC WITH DIFFERENTIAL/PLATELET
Abs Immature Granulocytes: 0.05 10*3/uL (ref 0.00–0.07)
Basophils Absolute: 0 10*3/uL (ref 0.0–0.1)
Basophils Relative: 0 %
Eosinophils Absolute: 0.1 10*3/uL (ref 0.0–0.5)
Eosinophils Relative: 1 %
HCT: 26 % — ABNORMAL LOW (ref 36.0–46.0)
Hemoglobin: 8.8 g/dL — ABNORMAL LOW (ref 12.0–15.0)
Immature Granulocytes: 1 %
Lymphocytes Relative: 16 %
Lymphs Abs: 1.6 10*3/uL (ref 0.7–4.0)
MCH: 30.3 pg (ref 26.0–34.0)
MCHC: 33.8 g/dL (ref 30.0–36.0)
MCV: 89.7 fL (ref 80.0–100.0)
Monocytes Absolute: 1 10*3/uL (ref 0.1–1.0)
Monocytes Relative: 10 %
Neutro Abs: 7.1 10*3/uL (ref 1.7–7.7)
Neutrophils Relative %: 72 %
Platelets: 127 10*3/uL — ABNORMAL LOW (ref 150–400)
RBC: 2.9 MIL/uL — ABNORMAL LOW (ref 3.87–5.11)
RDW: 12.8 % (ref 11.5–15.5)
WBC: 9.8 10*3/uL (ref 4.0–10.5)
nRBC: 0 % (ref 0.0–0.2)

## 2019-09-29 LAB — CBC
HCT: 26.5 % — ABNORMAL LOW (ref 36.0–46.0)
Hemoglobin: 9.1 g/dL — ABNORMAL LOW (ref 12.0–15.0)
MCH: 30 pg (ref 26.0–34.0)
MCHC: 34.3 g/dL (ref 30.0–36.0)
MCV: 87.5 fL (ref 80.0–100.0)
Platelets: 134 10*3/uL — ABNORMAL LOW (ref 150–400)
RBC: 3.03 MIL/uL — ABNORMAL LOW (ref 3.87–5.11)
RDW: 13 % (ref 11.5–15.5)
WBC: 11.9 10*3/uL — ABNORMAL HIGH (ref 4.0–10.5)
nRBC: 0 % (ref 0.0–0.2)

## 2019-09-29 MED ORDER — ACETAMINOPHEN 325 MG PO TABS
650.0000 mg | ORAL_TABLET | Freq: Four times a day (QID) | ORAL | Status: DC | PRN
  Administered 2019-09-29 – 2019-10-02 (×6): 650 mg via ORAL
  Filled 2019-09-29 (×6): qty 2

## 2019-09-29 MED ORDER — KETOROLAC TROMETHAMINE 30 MG/ML IJ SOLN
30.0000 mg | Freq: Four times a day (QID) | INTRAMUSCULAR | Status: DC
  Administered 2019-09-29 (×3): 30 mg via INTRAVENOUS
  Filled 2019-09-29 (×2): qty 1

## 2019-09-29 MED ORDER — SIMETHICONE 80 MG PO CHEW
80.0000 mg | CHEWABLE_TABLET | Freq: Three times a day (TID) | ORAL | Status: DC
  Administered 2019-09-29 – 2019-10-02 (×11): 80 mg via ORAL
  Filled 2019-09-29 (×11): qty 1

## 2019-09-29 MED ORDER — FENTANYL CITRATE (PF) 100 MCG/2ML IJ SOLN
INTRAMUSCULAR | Status: AC
  Administered 2019-09-29: 25 ug via INTRAVENOUS
  Filled 2019-09-29: qty 2

## 2019-09-29 MED ORDER — OXYTOCIN 40 UNITS IN NORMAL SALINE INFUSION - SIMPLE MED: 2.5000 [IU]/h | INTRAVENOUS | Status: AC

## 2019-09-29 MED ORDER — ENOXAPARIN SODIUM 40 MG/0.4ML ~~LOC~~ SOLN
40.0000 mg | SUBCUTANEOUS | Status: DC
  Administered 2019-09-30: 40 mg via SUBCUTANEOUS
  Filled 2019-09-29 (×2): qty 0.4

## 2019-09-29 MED ORDER — COCONUT OIL OIL
1.0000 "application " | TOPICAL_OIL | Status: DC | PRN
  Filled 2019-09-29: qty 120

## 2019-09-29 MED ORDER — IBUPROFEN 800 MG PO TABS
800.0000 mg | ORAL_TABLET | Freq: Three times a day (TID) | ORAL | Status: AC
  Administered 2019-09-30 – 2019-10-02 (×7): 800 mg via ORAL
  Filled 2019-09-29 (×7): qty 1

## 2019-09-29 MED ORDER — PRENATAL MULTIVITAMIN CH
1.0000 | ORAL_TABLET | Freq: Every day | ORAL | Status: DC
  Administered 2019-09-29 – 2019-09-30 (×2): 1 via ORAL
  Filled 2019-09-29 (×2): qty 1

## 2019-09-29 MED ORDER — SENNOSIDES-DOCUSATE SODIUM 8.6-50 MG PO TABS
2.0000 | ORAL_TABLET | ORAL | Status: DC
  Administered 2019-09-29 – 2019-10-02 (×3): 2 via ORAL
  Filled 2019-09-29 (×5): qty 2

## 2019-09-29 MED ORDER — MENTHOL 3 MG MT LOZG
1.0000 | LOZENGE | OROMUCOSAL | Status: DC | PRN
  Filled 2019-09-29: qty 9

## 2019-09-29 MED ORDER — SIMETHICONE 80 MG PO CHEW
80.0000 mg | CHEWABLE_TABLET | ORAL | Status: DC
  Administered 2019-09-30: 80 mg via ORAL

## 2019-09-29 MED ORDER — LACTATED RINGERS IV SOLN: INTRAVENOUS | Status: DC

## 2019-09-29 MED ORDER — DIBUCAINE (PERIANAL) 1 % EX OINT: 1.0000 "application " | TOPICAL_OINTMENT | CUTANEOUS | Status: DC | PRN

## 2019-09-29 MED ORDER — WITCH HAZEL-GLYCERIN EX PADS: 1.0000 "application " | MEDICATED_PAD | CUTANEOUS | Status: DC | PRN

## 2019-09-29 MED ORDER — KETOROLAC TROMETHAMINE 30 MG/ML IJ SOLN
INTRAMUSCULAR | Status: AC
  Filled 2019-09-29: qty 1

## 2019-09-29 MED ORDER — OXYTOCIN 40 UNITS IN NORMAL SALINE INFUSION - SIMPLE MED
INTRAVENOUS | Status: AC
  Filled 2019-09-29: qty 1000

## 2019-09-29 MED ORDER — FERROUS SULFATE 325 (65 FE) MG PO TABS
325.0000 mg | ORAL_TABLET | Freq: Two times a day (BID) | ORAL | Status: DC
  Administered 2019-09-29 – 2019-10-02 (×6): 325 mg via ORAL
  Filled 2019-09-29 (×6): qty 1

## 2019-09-29 MED ORDER — SIMETHICONE 80 MG PO CHEW
80.0000 mg | CHEWABLE_TABLET | ORAL | Status: DC | PRN
  Filled 2019-09-29: qty 1

## 2019-09-29 MED ORDER — OXYCODONE-ACETAMINOPHEN 5-325 MG PO TABS
1.0000 | ORAL_TABLET | ORAL | Status: DC | PRN
  Administered 2019-09-29 – 2019-09-30 (×2): 2 via ORAL
  Filled 2019-09-29 (×2): qty 2

## 2019-09-29 MED ORDER — ZOLPIDEM TARTRATE 5 MG PO TABS: 5.0000 mg | ORAL_TABLET | Freq: Every evening | ORAL | Status: DC | PRN

## 2019-09-29 MED ORDER — DIPHENHYDRAMINE HCL 25 MG PO CAPS: 25.0000 mg | ORAL_CAPSULE | Freq: Four times a day (QID) | ORAL | Status: DC | PRN

## 2019-09-29 NOTE — Lactation Note (Signed)
This note was copied from a baby's chart. Lactation Consultation Note  Patient Name: Sara Henson NOMVE'H Date: 09/29/2019 Reason for consult: Follow-up assessment;Mother's request;Difficult latch;Primapara;Term;Other (Comment)(LGA - Had 20 ml of formula earlier) Sara Henson has been having difficulty latching deeply and sustaining a good seal at the breast.  For this feeding, mom had been trying on both breasts with little success, with and without the nipple shield.  Demonstrated how to easily hand express some colostrum to entice her to latch.  Assisted mom in comfortable position with pillow support on right breast in football hold to keep her off of mom's abdomin (she had just taking pain medication).  Initially she was on and off the breast and when she did start sucking, she would curl in her lower lip and make a clicking or smacking sound.  When tried letting her suck on my finger at first, she would just bite down and not make any sucking effort.  When [facility] finally got her to start sucking on her finger, we switched her to the breast where she pulled in the breast further in her mouth.  Mom could feel a strong tug at the breast that she had not felt before.  She would suck for a few minutes and then lose the seal on the breast and resort to a shallow latch again.  After about 20 minutes of coming on and off the breast, she finally latched deeply enough to maintain the latch for about 15 minutes of actually sucking.  When she came off she appeared satiated.  When we moved her away from the breast, she started screaming.  We repositioned her on the left breast.  After several tries, she got a deep enough latch to suck for 15 minutes without losing the seal.  After she came off the breast, we left her swaddled next to mom.  Parents reported that this was the best feeding yet without the nipple shield, but still had concerns of being able to latch her independently.  Encouraged parents to take it one feeding  at a time, pointing out the positives and progress and and ask for assistance when needed.  Discussed feeding cues and encouraged mom to put Sara Henson to the breast whenever she demonstrated hunger cues to bring in mature milk and ensure a plentiful milk supply.  Reviewed normal newborn stomach size, adequate out put, supply and demand, normal course of lactation and routine newborn feeding patterns.  Lactation name and number written on white board and encouraged to call with any questions, concerns or assistance.   Maternal Data Formula Feeding for Exclusion: No Has patient been taught Hand Expression?: Yes Does the patient have breastfeeding experience prior to this delivery?: No(Pr1)  Feeding Feeding Type: Breast Fed  LATCH Score Latch: Repeated attempts needed to sustain latch, nipple held in mouth throughout feeding, stimulation needed to elicit sucking reflex.  Audible Swallowing: A few with stimulation  Type of Nipple: Everted at rest and after stimulation  Comfort (Breast/Nipple): Filling, red/small blisters or bruises, mild/mod discomfort  Hold (Positioning): Assistance needed to correctly position infant at breast and maintain latch.  LATCH Score: 6  Interventions Interventions: Breast feeding basics reviewed;Assisted with latch;Skin to skin;Breast massage;Hand express;Reverse pressure;Breast compression;Adjust position;Support pillows;Position options  Lactation Tools Discussed/Used WIC Program: [employer])   Consult Status Consult Status: Follow-up Follow-up type: Call as needed    Louis Meckel 09/29/2019, 8:07 PM

## 2019-09-29 NOTE — Anesthesia Postprocedure Evaluation (Signed)
Anesthesia Post Note  Patient: Sara Henson  Procedure(s) Performed: CESAREAN SECTION (N/A Abdomen)  Patient location during evaluation: Mother Baby Anesthesia Type: Epidural Level of consciousness: awake and alert Pain management: pain level controlled Vital Signs Assessment: post-procedure vital signs reviewed and stable Respiratory status: spontaneous breathing, nonlabored ventilation and respiratory function stable Cardiovascular status: stable Postop Assessment: no headache, no backache and epidural receding Anesthetic complications: no     Last Vitals:  Vitals:   09/29/19 1120 09/29/19 1300  BP: 118/63   Pulse: 93 96  Resp: 18   Temp: 36.6 C   SpO2: 98% 98%    Last Pain:  Vitals:   09/29/19 1417  TempSrc:   PainSc: Asleep                 Azalee Weimer K

## 2019-09-29 NOTE — Progress Notes (Signed)
Obstetric Postpartum/PostOperative Daily Progress Note Subjective:  19 y.o. G2P1011 post-operative day # 1 status post primary cesarean delivery.  She is not yet ambulating, is tolerating po, is not yet voiding spontaneously.  Her pain is well controlled on IV, PO and On Q pump pain medications. Her lochia is less than menses. She is receiving support for breastfeeding from RN. Nipple shield is helping. She reports feeling sleepy and denies feeling lightheaded or dizzy.   Medications SCHEDULED MEDICATIONS  . enoxaparin (LOVENOX) injection  40 mg Subcutaneous Q24H  . ferrous sulfate  325 mg Oral BID WC  . ibuprofen  800 mg Oral Q8H  . ketorolac  30 mg Intravenous Q6H  . ketorolac      . oxytocin 40 units in NS 1000 mL      . prenatal multivitamin  1 tablet Oral Q1200  . senna-docusate  2 tablet Oral Q24H  . simethicone  80 mg Oral TID PC  . simethicone  80 mg Oral Q24H    MEDICATION INFUSIONS  . lactated ringers    . oxytocin      PRN MEDICATIONS  acetaminophen, coconut oil, witch hazel-glycerin **AND** dibucaine, diphenhydrAMINE, menthol-cetylpyridinium, oxyCODONE-acetaminophen, simethicone, zolpidem    Objective:   Vitals:   09/29/19 0316 09/29/19 0448 09/29/19 0500 09/29/19 0727  BP: 113/67   (!) 115/51  Pulse:   (!) 101 87  Resp: 19   18  Temp:  98.7 F (37.1 C)  98.5 F (36.9 C)  TempSrc:  Oral    SpO2:   98% 99%  Weight:      Height:        Current Vital Signs 24h Vital Sign Ranges  T 98.5 F (36.9 C) Temp  Avg: 98.5 F (36.9 C)  Min: 97.7 F (36.5 C)  Max: 99.3 F (37.4 C)  BP (!) 115/51 BP  Min: 109/69  Max: 164/114  HR 87 Pulse  Avg: 90.8  Min: 66  Max: 120  RR 18 Resp  Avg: 17.8  Min: 14  Max: 23  SaO2 99 % Room Air SpO2  Avg: 98.9 %  Min: 96 %  Max: 100 %       24 Hour I/O Current Shift I/O  Time Ins Outs 05/28 0701 - 05/29 0700 In: 2922.6 [P.O.:500; I.V.:1972.6] Out: 4730 [Urine:2475] No intake/output data recorded.   General: NAD Pulmonary: no  increased work of breathing Abdomen: non-distended, non-tender, fundus firm at level of umbilicus Inc: Clean/dry/intact, On Q pump intact Extremities: no edema, no erythema, no tenderness Foley indwelling and draining clear urine, SCDs in place  Labs:  Recent Labs  Lab 09/26/19 1434 09/27/19 2026 09/29/19 0738  WBC 7.8 9.8 11.9*  HGB 14.0 14.1 9.1*  HCT 42.1 40.8 26.5*  PLT 188 184 134*     Assessment:   19 y.o. G2P1011 postoperative day # 1 status post primary cesarean section  Plan:  1) Acute blood loss anemia - hemodynamically stable and asymptomatic - po ferrous sulfate, recheck CBC later today  2) A POS / Rubella 6.67 (09/28 1446)/ Varicella Immune  3) TDAP status: given antepartum  4) breast/Contraception = not discussed  5) Disposition: continue current care   Tresea Mall, CNM 09/29/2019 10:41 AM

## 2019-09-30 ENCOUNTER — Encounter: Payer: Self-pay | Admitting: Registered Nurse

## 2019-09-30 NOTE — Progress Notes (Signed)
Obstetric Postpartum/PostOperative Daily Progress Note Subjective:  19 y.o. G2P1011 post-operative day # 2 status post primary cesarean delivery.  She is ambulating, is tolerating po, is voiding spontaneously.  Her pain is well controlled on PO pain medications and On Q pump. Her lochia is less than menses. She is breastfeeding without nipple shield in football position as I enter the room and reports success with this feeding. Newborn is content at the breast and she feels tugging without pinching. She prefers to stay in hospital until tomorrow for continued support with lactation and pain control.    Medications SCHEDULED MEDICATIONS  . enoxaparin (LOVENOX) injection  40 mg Subcutaneous Q24H  . ferrous sulfate  325 mg Oral BID WC  . ibuprofen  800 mg Oral Q8H  . ketorolac  30 mg Intravenous Q6H  . prenatal multivitamin  1 tablet Oral Q1200  . senna-docusate  2 tablet Oral Q24H  . simethicone  80 mg Oral TID PC  . simethicone  80 mg Oral Q24H    MEDICATION INFUSIONS  . lactated ringers      PRN MEDICATIONS  acetaminophen, coconut oil, witch hazel-glycerin **AND** dibucaine, diphenhydrAMINE, menthol-cetylpyridinium, oxyCODONE-acetaminophen, simethicone, zolpidem    Objective:   Vitals:   09/29/19 1646 09/29/19 2008 09/30/19 0003 09/30/19 0836  BP: 120/64 122/60 115/62 110/71  Pulse: 91 85 100 70  Resp: 18 18 18 18   Temp:  98.5 F (36.9 C) 98 F (36.7 C) 97.9 F (36.6 C)  TempSrc: Axillary Oral Oral Oral  SpO2: 99% 98% 98% 100%  Weight:      Height:        Current Vital Signs 24h Vital Sign Ranges  T 97.9 F (36.6 C) Temp  Avg: 98.1 F (36.7 C)  Min: 97.9 F (36.6 C)  Max: 98.5 F (36.9 C)  BP 110/71 BP  Min: 110/71  Max: 122/60  HR 70 Pulse  Avg: 88  Min: 70  Max: 100  RR 18 Resp  Avg: 18  Min: 18  Max: 18  SaO2 100 %(Room Air) Room Air SpO2  Avg: 97.5 %  Min: 95 %  Max: 100 %       24 Hour I/O Current Shift I/O  Time Ins Outs 05/29 0701 - 05/30 0700 In: -  Out:  3175 [Urine:3175] No intake/output data recorded.   General: NAD Pulmonary: no increased work of breathing Abdomen: non-distended, non-tender, fundus firm at level of umbilicus Inc: Clean/dry/intact, On Q pump intact Extremities: no edema, no erythema, no tenderness  Labs:  Recent Labs  Lab 09/27/19 2026 09/29/19 0738 09/29/19 1748  WBC 9.8 11.9* 9.8  HGB 14.1 9.1* 8.8*  HCT 40.8 26.5* 26.0*  PLT 184 134* 127*     Assessment:   19 y.o. G2P1011 postoperative day # 2 status post primary cesarean section, lactating  Plan:  1) Acute blood loss anemia - hemodynamically stable and asymptomatic -CBC: H/H slightly lower/stable - po ferrous sulfate  2) A POS / Rubella 6.67 (09/28 1446)/ Varicella Immune  3) TDAP status given antepartum  4) breast: continue lactation support/Contraception = considering non-hormonal methods  5) Disposition: continue current care with likely discharge tomorrow   06-30-1980, CNM 09/30/2019 12:11 PM

## 2019-09-30 NOTE — Lactation Note (Signed)
This note was copied from a baby's chart. Lactation Consultation Note  Patient Name: Sara Henson EVOJJ'K Date: 09/30/2019 Reason for consult: Follow-up assessment;Mother's request;Difficult latch;Primapara;Term;Other (Comment)(Fussy baby with disorganized suck getting on tip of nipple)  When entered room, [name] was screaming and parents were attempting to latch her to the breast.  She was coming on and off the breast in cradle hold pushing away from the breast, curling in her lower lip and getting shallow latch with poor, disorganized suck.  Calmed [name] first.  Then assisted mom in comfortable position for baby and mom in football hold on right breast with right arm under mom's breast and holding on to the left arm.   Positioned with pillows so baby is up to the level of the breast.  Easily hand expressed copious amounts of colostrum to entice her to latch.  Once deep enough latch was achieved, [name] relaxed her arm and went into rhythmic suck with frequent swallows.  Mom could feel strong tug at the breast without pain.  Occasionally massaged breast to keep [name] productively sucking at the breast with audible swallows.  She sustained the latch for 20 minutes before releasing the breast.  When we removed her from the breast and put on mom's chest, she started rooting for her hands and screaming again. Placed her on the left breast with FOB's assistance where she started sucking vigorously after a few attempts.  She remained on the left breast for 30 minutes with good sucking and swallowing before falling to sleep.  Mom had to go to the bathroom so removed her from the breast.  She was not happy about leaving mom.  FOB held and comforted for a while.  Within an hour, she was rooting for the breast again.  Mom recognized the feeding cues and put her back to the breast.  While she was feeding on one side, demonstrated how to hand express into 11 ml vial to give instead of formula if she  supplemented her again.  Lactation name and number written on white board and encouraged to call with any questions, concerns or assistance.  Maternal Data Formula Feeding for Exclusion: No Has patient been taught Hand Expression?: Yes Does the patient have breastfeeding experience prior to this delivery?: No  Feeding Feeding Type: Breast Fed  LATCH Score Latch: Repeated attempts needed to sustain latch, nipple held in mouth throughout feeding, stimulation needed to elicit sucking reflex.  Audible Swallowing: Spontaneous and intermittent  Type of Nipple: Everted at rest and after stimulation  Comfort (Breast/Nipple): Soft / non-tender  Hold (Positioning): Assistance needed to correctly position infant at breast and maintain latch.  LATCH Score: 8  Interventions Interventions: Assisted with latch;Skin to skin;Breast massage;Hand express;Breast compression;Adjust position;Support pillows;Position options  Lactation Tools Discussed/Used Tools: Other (comment)(11 ml vials given for hand expression) WIC Program: [employer])   Consult Status Consult Status: Follow-up Date: 09/30/19 Follow-up type: In-patient    Louis Meckel 09/30/2019, 5:13 PM

## 2019-10-01 NOTE — Lactation Note (Signed)
This note was copied from a baby's chart. Lactation Consultation Note  Patient Name: Sara Henson [id number] Date: 10/01/2019 Reason for consult: Follow-up assessment;Difficult latch;Primapara;Term;Hyperbilirubinemia;Other (Comment)(Fussy LGA baby under phototherapy)  Mom had difficulty latching and sustaining the latch since delivery.  She tried nipple shield, but did not seem to help.  Sara Henson screams when not snuggled near mom.  Now she is on phototherapy for hyperbilirubinemia with 14.4 reading.  When mom gave a bottle of formula over night, Sara Henson slept the longest she had since birth, so parents just want to give bottle for now.  Mom still wants Sara Henson to get her breast milk, but does not want to put her to the breast.  Mom's nipples are sore.  Coconut oil and comfort gels have been given with instructions in rotating use.  Symphony has been set up in room.  Instructions given in breast massage, hand expression (11 ml vials given), pumping, collection, storage, cleaning, labeling and handling of expressed milk.  Mom expressed 12 to 15 ml at this pumping which was encouraging to parents.  Encouraged mom to continue to pump around every 3 hours or 8 or more times in 24 hours to continue to stimulate the breast and transition the mature milk, ensure a plentiful milk supply and prevent engorgement.  She fed the 12 ml via bottle.  She still gave an additional 25 ml of formula and she did not spit.  Mom has a Spectra pump at home.  Reviewed normal newborn stomach size, adequate intake and out put, supply and demand, normal course of lactation and routine newborn feeding patterns.  Lactation Government social research officer given with contact numbers and reviewed and lactation name and number written on white board encouraging parents to cal with any questions, concerns or assistance while in the hospital and upon discharge.   Maternal Data Formula Feeding for Exclusion: No Has patient been taught Hand  Expression?: Yes Does the patient have breastfeeding experience prior to this delivery?: No(P1)  Feeding Feeding Type: Breast Milk with Formula added Nipple Type: Slow - flow  LATCH Score                   Interventions Interventions: Expressed milk;Coconut oil;Comfort gels;DEBP  Lactation Tools Discussed/Used Tools: Pump;Coconut oil;Comfort gels Breast pump type: Double-Electric Breast Pump WIC Program: No([employer]) Pump Review: Setup, frequency, and cleaning;Milk Storage;Other (comment) Initiated by:: S.Alaine Loughney,RN,BSN,IBCLC Date initiated:: 10/01/19   Consult Status Consult Status: PRN Follow-up type: Call as needed    Sara Henson 10/01/2019, 3:04 PM

## 2019-10-01 NOTE — Progress Notes (Signed)
Patient ID: Sara Henson, female   DOB: [birth year], 19 y.o.   MRN: [id number]   Subjective:   Sara Henson is visibly upset. Her baby has been fussy and unable to be soothed while under the bilirubin lights. She has been breastfeeding. There has been difficulty latching and baby has seemed hungry. To 35 cc of formula when it was offered. Sara Henson is going to be pumping and bottle feeding. She reports she has had a bowel movement. Tolerating diet. Ambulating well. Pain controlled. Voiding without issues.   Objective:  Blood pressure (!) 118/97, pulse 90, temperature 98.4 F (36.9 C), temperature source Oral, resp. rate 20, height 5\' 7"  (1.702 m), weight 103.4 kg, last menstrual period 12/16/2018, SpO2 99 %, unknown if currently breastfeeding.  General: NAD Pulmonary: no increased work of breathing Abdomen: non-distended, non-tender, fundus firm at level of umbilicus Incision: clean, dry, intact.  Extremities: no edema, no erythema, no tenderness  Results for orders placed or performed during the hospital encounter of 09/27/19 (from the past 72 hour(s))  CBC     Status: Abnormal   Collection Time: 09/29/19  7:38 AM  Result Value Ref Range   WBC 11.9 (H) 4.0 - 10.5 K/uL   RBC 3.03 (L) 3.87 - 5.11 MIL/uL   Hemoglobin 9.1 (L) 12.0 - 15.0 g/dL    Comment: REPEATED TO VERIFY   HCT 26.5 (L) 36.0 - 46.0 %   MCV 87.5 80.0 - 100.0 fL   MCH 30.0 26.0 - 34.0 pg   MCHC 34.3 30.0 - 36.0 g/dL   RDW 13.0 11.5 - 15.5 %   Platelets 134 (L) 150 - 400 K/uL   nRBC 0.0 0.0 - 0.2 %    Comment: Performed at [facility], [address] [place], [place] [address]  CBC with Differential/Platelet     Status: Abnormal   Collection Time: 09/29/19  5:48 PM  Result Value Ref Range   WBC 9.8 4.0 - 10.5 K/uL   RBC 2.90 (L) 3.87 - 5.11 MIL/uL   Hemoglobin 8.8 (L) 12.0 - 15.0 g/dL   HCT 26.0 (L) 36.0 - 46.0 %   MCV 89.7 80.0 - 100.0 fL   MCH 30.3 26.0 - 34.0 pg   MCHC 33.8 30.0 - 36.0 g/dL   RDW 12.8 11.5 - 15.5 %    Platelets 127 (L) 150 - 400 K/uL   nRBC 0.0 0.0 - 0.2 %   Neutrophils Relative % 72 %   Neutro Abs 7.1 1.7 - 7.7 K/uL   Lymphocytes Relative 16 %   Lymphs Abs 1.6 0.7 - 4.0 K/uL   Monocytes Relative 10 %   Monocytes Absolute 1.0 0.1 - 1.0 K/uL   Eosinophils Relative 1 %   Eosinophils Absolute 0.1 0.0 - 0.5 K/uL   Basophils Relative 0 %   Basophils Absolute 0.0 0.0 - 0.1 K/uL   Immature Granulocytes 1 %   Abs Immature Granulocytes 0.05 0.00 - 0.07 K/uL    Comment: Performed at [facility], [address] [place], [place] [address]     Assessment:   19 y.o. G2P1011 postoperativeday # 3   Plan:  1) Acute blood loss anemia - hemodynamically stable and asymptomatic - po ferrous sulfate  2) Blood Type --/--/A POS (05/27 2213)   3) Rubella 6.67 (09/28 1446) / Varicella Immune  4) TDAP status - up to date Immunization History  Administered Date(s) Administered  . Influenza,inj,Quad PF,6+ Mos 03/07/2019  . Tdap 07/16/2019    5) Feeding-  breast pumping   6) Contraception - not discussed, has been planning pill  7) Disposition - continue care, home tomorrow.  Sara Idler MD, [facility], [facility] 10/01/2019 9:41 AM

## 2019-10-01 NOTE — Progress Notes (Signed)
CSW received a consult due to [place] Score of 9. Per guidelines, CSW does not complete assessment unless Sara Henson is 10 or above --- unless there are additional concerns. CSW consulted with RN Maddy. Maddy reported MOB is appropriate and denied TOC needs or concerns at this time. CSW screened out consult.  Please re consult TOC if needed.  Gavriella Hearst, [place] [phone]

## 2019-10-02 NOTE — Final Progress Note (Signed)
See discharge summary  Mirna Mires, CNM  10/02/2019 12:16 PM

## 2019-10-02 NOTE — Discharge Instructions (Signed)
Please call your doctor or return to the ER if you experience any chest pains, shortness of breath, dizziness, visual changes, severe headache (unrelieved by pain meds), fever greater than 101, any heavy bleeding (saturating more than 1 pad per hour), large clots, or foul smelling discharge, any worsening abdominal pain and cramping that is not controlled by pain medication, any calf/leg pain or redness, any breast concerns (redness/pain), or any signs of postpartum depression. No tampons, enemas, douches, or sexual intercourse for 6 weeks. Also avoid tub baths, hot tubs, or swimming for 6 weeks.    Check your incision daily for any signs of infection such as redness, warmth, swelling, increased pain, or pus/foul smelling drainage  Activity: do not lift over 10 lbs for 6 weeks No driving for 1-2 weeks  Pelvic rest for 6 weeks  

## 2019-10-02 NOTE — Lactation Note (Signed)
This note was copied from a baby's chart. Lactation Consultation Note  Patient Name: Sara Henson XLEZV'G Date: 10/02/2019 Reason for consult: Follow-up assessment;Mother's request;1st time breastfeeding;Term;Hyperbilirubinemia  LC follow-up before discharge. Parents report a better night with Sara Henson. Mom continues to pump and use hand expression to provide expressed breastmilk before formula supplement. Jaundice levels have improved, and family is preparing for discharge. Mom plans to continue pumping and offering breastmilk via bottle, and using supplement as needed until breastmilk volume increases. Parents had questions re: pump cleaning, milk storage, and offering breastmilk and formula- questions answered. Reinforced breastfeeding support post discharge by outpatient lactation services and community breastfeeding resources.  Maternal Data Formula Feeding for Exclusion: No Has patient been taught Hand Expression?: Yes Does the patient have breastfeeding experience prior to this delivery?: No  Feeding Feeding Type: Bottle Fed - Formula Nipple Type: Slow - flow  LATCH Score                   Interventions Interventions: Breast feeding basics reviewed;DEBP  Lactation Tools Discussed/Used     Consult Status Consult Status: Complete Date: 10/02/19 Follow-up type: Call as needed    Danford Bad 10/02/2019, 9:45 AM

## 2019-10-02 NOTE — Progress Notes (Signed)
Discharge order received from doctor. Incision cleaning kit given and reviewed. Reviewed discharge instructions and prescriptions with patient and answered all questions. Follow up appointment instructions given. Patient verbalized understanding. ID bands checked. Patient discharged home with infant via wheelchair by nursing/auxillary.    Audree Bane, RN

## 2019-10-04 ENCOUNTER — Telehealth: Payer: Self-pay

## 2019-10-04 NOTE — Telephone Encounter (Signed)
Please call pt to schedule sooner than what she's already scheduled for the 1 week incision check from delivery date. She can see any provider for the incision check (per Selena Batten)

## 2019-10-04 NOTE — Telephone Encounter (Signed)
Scheduled with [facility].

## 2019-10-04 NOTE — Telephone Encounter (Signed)
Pt left msg on triage line saying she delivered 5/28 and her 1 wk incision check isnt until 6/10. She would like to know if this is okay, and asked how much should she be walking. I checked with Selena Batten to advise with the incision check and per Selena Batten, pt can see any provider for the 1 week incision check. Pt should walk as much as she can to help with the healing process. Pt is aware.

## 2019-10-05 ENCOUNTER — Encounter: Payer: Self-pay | Admitting: Obstetrics and Gynecology

## 2019-10-05 ENCOUNTER — Other Ambulatory Visit: Payer: Self-pay

## 2019-10-05 ENCOUNTER — Ambulatory Visit (INDEPENDENT_AMBULATORY_CARE_PROVIDER_SITE_OTHER): Payer: Managed Care, Other (non HMO) | Admitting: Obstetrics and Gynecology

## 2019-10-05 DIAGNOSIS — F53 Postpartum depression: Secondary | ICD-10-CM

## 2019-10-05 NOTE — Patient Instructions (Signed)
Postpartum Baby Blues The postpartum period begins right after the birth of a baby. During this time, there is often a lot of joy and excitement. It is also a time of many changes in the life of the parents. No matter how many times a mother gives birth, each child brings new challenges to the family, including different ways of relating to one another. It is common to have feelings of excitement along with confusing changes in moods, emotions, and thoughts. You may feel happy one minute and sad or stressed the next. These feelings of sadness usually happen in the period right after you have your baby, and they go away within a week or two. This is called the "baby blues." What are the causes? There is no known cause of baby blues. It is likely caused by a combination of factors. However, changes in hormone levels after childbirth are believed to trigger some of the symptoms. Other factors that can play a role in these mood changes include:  Lack of sleep.  Stressful life events, such as poverty, caring for a loved one, or death of a loved one.  Genetics. What are the signs or symptoms? Symptoms of this condition include:  Brief changes in mood, such as going from extreme happiness to sadness.  Decreased concentration.  Difficulty sleeping.  Crying spells and tearfulness.  Loss of appetite.  Irritability.  Anxiety. If the symptoms of baby blues last for more than 2 weeks or become more severe, you may have postpartum depression. How is this diagnosed? This condition is diagnosed based on an evaluation of your symptoms. There are no medical or lab tests that lead to a diagnosis, but there are various questionnaires that a health care provider may use to identify women with the baby blues or postpartum depression. How is this treated? Treatment is not needed for this condition. The baby blues usually go away on their own in 1-2 weeks. Social support is often all that is needed. You will  be encouraged to get adequate sleep and rest. Follow these instructions at home: Lifestyle      Get as much rest as you can. Take a nap when the baby sleeps.  Exercise regularly as told by your health care provider. Some women find yoga and walking to be helpful.  Eat a balanced and nourishing diet. This includes plenty of fruits and vegetables, whole grains, and lean proteins.  Do little things that you enjoy. Have a cup of tea, take a bubble bath, read your favorite magazine, or listen to your favorite music.  Avoid alcohol.  Ask for help with household chores, cooking, grocery shopping, or running errands. Do not try to do everything yourself. Consider hiring a postpartum doula to help. This is a professional who specializes in providing support to new mothers.  Try not to make any major life changes during pregnancy or right after giving birth. This can add stress. General instructions  Talk to people close to you about how you are feeling. Get support from your partner, family members, friends, or other new moms. You may want to join a support group.  Find ways to cope with stress. This may include: ? Writing your thoughts and feelings in a journal. ? Spending time outside. ? Spending time with people who make you laugh.  Try to stay positive in how you think. Think about the things you are grateful for.  Take over-the-counter and prescription medicines only as told by your health care provider.    Let your health care provider know if you have any concerns.  Keep all postpartum visits as told by your health care provider. This is important. Contact a health care provider if:  Your baby blues do not go away after 2 weeks. Get help right away if:  You have thoughts of taking your own life (suicidal thoughts).  You think you may harm the baby or other people.  You see or hear things that are not there (hallucinations). Summary  After giving birth, you may feel happy  one minute and sad or stressed the next. Feelings of sadness that happen right after the baby is born and go away after a week or two are called the "baby blues."  You can manage the baby blues by getting enough rest, eating a healthy diet, exercising, spending time with supportive people, and finding ways to cope with stress.  If feelings of sadness and stress last longer than 2 weeks or get in the way of caring for your baby, talk to your health care provider. This may mean you have postpartum depression. This information is not intended to replace advice given to you by your health care provider. Make sure you discuss any questions you have with your health care provider. Document Revised: 08/11/2018 Document Reviewed: 06/15/2016 Elsevier Patient Education  [place] Depression When a woman feels excessive sadness, anger, or anxiety during pregnancy or during the first 12 months after she gives birth, she has a condition called perinatal depression. Depression can interfere with work, school, relationships, and other everyday activities. If it is not managed properly, it can also cause problems in the mother and her baby. Sometimes, perinatal depression is left untreated because symptoms are thought to be normal mood swings during and right after pregnancy. If you have symptoms of depression, it is important to talk with your health care provider. What are the causes? The exact cause of this condition is not known. Hormonal changes during and after pregnancy may play a role in causing perinatal depression. What increases the risk? You are more likely to develop this condition if:  You have a personal or family history of depression, anxiety, or mood disorders.  You experience a stressful life event during pregnancy, such as the death of a loved one.  You have a lot of regular life stress.  You do not have support from family members or loved ones, or you are in an  abusive relationship. What are the signs or symptoms? Symptoms of this condition include:  Feeling sad or hopeless.  Feelings of guilt.  Feeling irritable or overwhelmed.  Changes in your appetite.  Lack of energy or motivation.  Sleep problems.  Difficulty concentrating or completing tasks.  Loss of interest in hobbies or relationships.  Headaches or stomach problems that do not go away. How is this diagnosed? This condition is diagnosed based on a physical exam and mental evaluation. In some cases, your health care provider may use a depression screening tool. These tools include a list of questions that can help a health care provider diagnose depression. Your health care provider may refer you to a mental health expert who specializes in depression. How is this treated? This condition may be treated with:  Medicines. Your health care provider will only give you medicines that have been proven safe for pregnancy and breastfeeding.  Talk therapy with a mental health professional to help change your patterns of thinking (cognitive behavioral therapy).  Support groups.  Brain stimulation or light therapies.  Stress reduction therapies, such as mindfulness. Follow these instructions at home: Lifestyle  Do not use any products that contain nicotine or tobacco, such as cigarettes and e-cigarettes. If you need help quitting, ask your health care provider.  Do not use alcohol when you are pregnant. After your baby is born, limit alcohol intake to no more than 1 drink a day. One drink equals 12 oz of beer, 5 oz of wine, or 1 oz of hard liquor.  Consider joining a support group for new mothers. Ask your health care provider for recommendations.  Take good care of yourself. Make sure you: ? Get plenty of sleep. If you are having trouble sleeping, talk with your health care provider. ? Eat a healthy diet. This includes plenty of fruits and vegetables, whole grains, and lean  proteins. ? Exercise regularly, as told by your health care provider. Ask your health care provider what exercises are safe for you. General instructions  Take over-the-counter and prescription medicines only as told by your health care provider.  Talk with your partner or family members about your feelings during pregnancy. Share any concerns or anxieties that you may have.  Ask for help with tasks or chores when you need it. Ask friends and family members to provide meals, watch your children, or help with cleaning.  Keep all follow-up visits as told by your health care provider. This is important. Contact a health care provider if:  You (or people close to you) notice that you have any symptoms of depression.  You have depression and your symptoms get worse.  You experience side effects from medicines, such as nausea or sleep problems. Get help right away if:  You feel like hurting yourself, your baby, or someone else. If you ever feel like you may hurt yourself or others, or have thoughts about taking your own life, get help right away. You can go to your nearest emergency department or call:  Your local emergency services (911 in the [place]  A suicide crisis helpline, such as the National Suicide Prevention Lifeline at 7187984364. This is open 24 hours a day. Summary  Perinatal depression is when a woman feels excessive sadness, anger, or anxiety during pregnancy or during the first 12 months after she gives birth.  If perinatal depression is not treated, it can lead to health problems for the mother and her baby.  This condition is treated with medicines, talk therapy, stress reduction therapies, or a combination of two or more treatments.  Talk with your partner or family members about your feelings. Do not be afraid to ask for help. This information is not intended to replace advice given to you by your health care provider. Make sure you discuss any questions you have  with your health care provider. Document Revised: 10/04/2018 Document Reviewed: 06/16/2016 Elsevier Patient Education  2020 [employer].    [facility] [place] [place] Same Day Access Hours:  Monday, Wednesday and Friday, 8am - 3pm Walk-In Crisis Hours: 7 days/wk, 8am - 8pm [address] [place], [place] [address] [phone]  [employer] [phone]  Mobile Crisis Unit [phone]   Therapists/Counselors/Psychologists  [facility] [address], [facility] [address] [place], [place] [address] [phone]  Karen [place], [place]  & Jacqlyn Krauss [place]    [phone]        [address]       [place], [place] [address]  Ival Bible, CSW [phone] [address] [place], [place] [address]  Harle Battiest, [place]        [phone]        [address]      [place], [place] [address]        Chyrel Masson, [place] [phone] [address] [place], [place] [address]   Oscar La, LMFT       [phone]        [address]       [place], [place] [address]        Felecia Jan [phone] [address] [place], [place] [address]  Lester [place]        [phone]        [address]       [place], [place] [address]        Kerin Salen [phone] [address] [place], [place] [address]  Tyron Russell, PsyD       [phone]        [address]       [place], [place] [address]        Elita Quick, LPC [phone] [address]  [place], [place] [address]   Debarah Crape        [phone]        [address] [place], [place] [address]         Rosana Hoes [facility] [phone] [email or web address]   Sation Konchella       [phone]        [address]       [place], [place] [address]        Morton Stall [facility] [phone] [email or web address]

## 2019-10-05 NOTE — Progress Notes (Signed)
°  OBSTETRICS POSTPARTUM CLINIC PROGRESS NOTE  Subjective:     Sara Henson is a 19 y.o. G55P1011 female who presents for a postpartum visit. She is 1 week postpartum following a Term pregnancy and delivery by C-section failure to progress.  I have fully reviewed the prenatal and intrapartum course. Anesthesia: epidural.  Postpartum course has been complicated by complicated by difficulty breastfeeding, now improved.  Baby is feeding by Breast.  Bleeding: patient has not  resumed menses.  Bowel function is normal. Bladder function is normal.  Patient is not sexually active.  Postpartum depression screening: positive. [place] 12.  The following portions of the patient's history were reviewed and updated as appropriate: allergies, current medications, past family history, past medical history, past social history, past surgical history and problem list.  Review of Systems Pertinent items are noted in HPI.  Objective:    BP 120/74    Ht 5\' 7"  (1.702 m)    Wt 203 lb 9.6 oz (92.4 kg)    Breastfeeding Yes    BMI 31.89 kg/m   General:  alert and no distress   Breasts:  inspection negative, no nipple discharge or bleeding, no masses or nodularity palpable  Lungs: clear to auscultation bilaterally  Heart:  regular rate and rhythm, S1, S2 normal, no murmur, click, rub or gallop  Abdomen: soft, non-tender; bowel sounds normal; no masses,  no organomegaly.   Well healed Pfannenstiel incision   Vulva:  normal  Vagina: normal vagina, no discharge, exudate, lesion, or erythema  Cervix:  no cervical motion tenderness and no lesions  Corpus: normal size, contour, position, consistency, mobility, non-tender  Adnexa:  normal adnexa and no mass, fullness, tenderness  Rectal Exam: Not performed.          Assessment:  Post Partum Care visit There are no diagnoses linked to this encounter.  Plan:  See orders and Patient Instructions Follow up in: 2 weeks or as needed.   Discussed options for  treatment of postpartum depression.  Discussed initiation of medication and Zulresso therapy. Patient feels like her mood has been improving since finding her place with the baby.  At this time she would like to continue to monitor her symptoms.  She feels well supported at home by family members and husband.  We will follow up with the patient in 2 weeks.  Provided with resources for mental health.  MD, Adelene Idler OB/GY[facility], [facility] 10/05/2019 3:34 PM

## 2019-10-11 ENCOUNTER — Ambulatory Visit: Payer: Managed Care, Other (non HMO) | Admitting: Obstetrics and Gynecology

## 2019-10-19 ENCOUNTER — Ambulatory Visit (INDEPENDENT_AMBULATORY_CARE_PROVIDER_SITE_OTHER): Payer: Managed Care, Other (non HMO) | Admitting: Obstetrics and Gynecology

## 2019-10-19 ENCOUNTER — Other Ambulatory Visit: Payer: Self-pay

## 2019-10-19 ENCOUNTER — Encounter: Payer: Self-pay | Admitting: Obstetrics and Gynecology

## 2019-10-19 VITALS — BP 122/70 | Ht 67.0 in | Wt 192.0 lb

## 2019-10-19 DIAGNOSIS — Z4889 Encounter for other specified surgical aftercare: Secondary | ICD-10-CM

## 2019-10-19 DIAGNOSIS — O99345 Other mental disorders complicating the puerperium: Secondary | ICD-10-CM

## 2019-10-19 DIAGNOSIS — F53 Postpartum depression: Secondary | ICD-10-CM

## 2019-10-19 MED ORDER — SERTRALINE HCL 50 MG PO TABS: 50.0000 mg | ORAL_TABLET | Freq: Every day | ORAL | 2 refills | Status: DC

## 2019-10-19 NOTE — Progress Notes (Signed)
Postoperative Follow-up Patient presents post op from 1LTCS 2weeks ago for failure to progress (CPD).  Subjective: Patient reports marked improvement in her preop symptoms. Eating a regular diet without difficulty. Pain is controlled without any medications.  Activity: normal activities of daily living.  She has noted some postpartum depression which she voiced last week, this has been improving but still present.  She is also concerned about her husband return to work next week.  Objective: Blood pressure 122/70, height 5\' 7"  (1.702 m), weight 192 lb (87.1 kg), currently breastfeeding.  Admission on 09/27/2019, Discharged on 10/02/2019  Component Date Value Ref Range Status  . WBC 09/27/2019 9.8  4.0 - 10.5 K/uL Final  . RBC 09/27/2019 4.64  3.87 - 5.11 MIL/uL Final  . Hemoglobin 09/27/2019 14.1  12.0 - 15.0 g/dL Final  . HCT 09/27/2019 40.8  36 - 46 % Final  . MCV 09/27/2019 87.9  80.0 - 100.0 fL Final  . MCH 09/27/2019 30.4  26.0 - 34.0 pg Final  . MCHC 09/27/2019 34.6  30.0 - 36.0 g/dL Final  . RDW 09/27/2019 12.6  11.5 - 15.5 % Final  . Platelets 09/27/2019 184  150 - 400 K/uL Final  . nRBC 09/27/2019 0.0  0.0 - 0.2 % Final   Performed at [facility], [address] [place], [place] [address]  . RPR Ser Ql 09/27/2019 NON REACTIVE  NON REACTIVE Final   Performed at [facility], [place] [address], [place], [place] [address]  . Creatinine, Urine 09/27/2019 35  mg/dL Final  . Total Protein, Urine 09/27/2019 <6  mg/dL Final   NO NORMAL RANGE ESTABLISHED FOR THIS TEST  . Protein Creatinine Ratio 09/27/2019         0.00 - 0.15 mg/mg[Cre] Final   Comment: RESULT BELOW REPORTABLE RANGE, UNABLE TO CALCULATE. Performed at [facility], [address], [place], [place] [address]   . Sodium 09/27/2019 136  135 - 145 mmol/L Final  . Potassium 09/27/2019 3.9  3.5 - 5.1 mmol/L Final  . Chloride 09/27/2019 104  98 - 111 mmol/L Final  . CO2 09/27/2019 21* 22  - 32 mmol/L Final  . Glucose, Bld 09/27/2019 110* 70 - 99 mg/dL Final   Glucose reference range applies only to samples taken after fasting for at least 8 hours.  . BUN 09/27/2019 13  6 - 20 mg/dL Final  . Creatinine, Ser 09/27/2019 0.45  0.44 - 1.00 mg/dL Final  . Calcium 09/27/2019 9.3  8.9 - 10.3 mg/dL Final  . Total Protein 09/27/2019 6.3* 6.5 - 8.1 g/dL Final  . Albumin 09/27/2019 3.1* 3.5 - 5.0 g/dL Final  . AST 09/27/2019 21  15 - 41 U/L Final  . ALT 09/27/2019 15  0 - 44 U/L Final  . Alkaline Phosphatase 09/27/2019 151* 38 - 126 U/L Final  . Total Bilirubin 09/27/2019 0.6  0.3 - 1.2 mg/dL Final  . GFR calc non Af Amer 09/27/2019 >60  >60 mL/min Final  . GFR calc Af Amer 09/27/2019 >60  >60 mL/min Final  . Anion gap 09/27/2019 11  5 - 15 Final   Performed at [facility], [address] [place], [place] [address]  . HIV-1 P24 Antigen - HIV24 09/27/2019 NON REACTIVE  NON REACTIVE Final   Comment: (NOTE) Detection of p24 may be inhibited by biotin in the sample, causing false negative results in acute infection.   Marland Kitchen HIV 1/2 Antibodies 09/27/2019 NON REACTIVE  NON REACTIVE Final  . Interpretation (HIV Ag Ab) 09/27/2019 A non reactive test result means that HIV 1 or HIV 2 antibodies and HIV 1 p24 antigen were not detected in the specimen.   Final   Performed at [facility], [address], [place], [place] [address]  . ABO/RH(D) 09/27/2019 A POS   Final  . Antibody Screen 09/27/2019 NEG   Final  . Sample Expiration 09/27/2019    Final                   Value:09/30/2019,2359 Performed at [facility], [address], [place], [place] [address]   . WBC 09/29/2019 11.9* 4.0 - 10.5 K/uL Final  . RBC 09/29/2019 3.03* 3.87 - 5.11 MIL/uL Final  . Hemoglobin 09/29/2019 9.1* 12.0 - 15.0 g/dL Final   REPEATED TO VERIFY  . HCT 09/29/2019 26.5* 36 - 46 % Final  . MCV 09/29/2019 87.5  80.0 - 100.0 fL Final  . MCH 09/29/2019 30.0  26.0 - 34.0 pg Final  . MCHC  09/29/2019 34.3  30.0 - 36.0 g/dL Final  . RDW 23/30/0762 13.0  11.5 - 15.5 % Final  . Platelets 09/29/2019 134* 150 - 400 K/uL Final  . nRBC 09/29/2019 0.0  0.0 - 0.2 % Final   Performed at [facility], [address] [place], [place] [address]  . WBC 09/29/2019 9.8  4.0 - 10.5 K/uL Final  . RBC 09/29/2019 2.90* 3.87 - 5.11 MIL/uL Final  . Hemoglobin 09/29/2019 8.8* 12.0 - 15.0 g/dL Final  . HCT 54/56/2563 26.0* 36 - 46 % Final  . MCV 09/29/2019 89.7  80.0 - 100.0 fL Final  . MCH 09/29/2019 30.3  26.0 - 34.0 pg Final  . MCHC 09/29/2019 33.8  30.0 - 36.0 g/dL Final  . RDW 89/37/3428 12.8  11.5 - 15.5 % Final  . Platelets 09/29/2019 127* 150 - 400 K/uL Final  . nRBC 09/29/2019 0.0  0.0 - 0.2 % Final  . Neutrophils Relative % 09/29/2019 72  % Final  . Neutro Abs 09/29/2019 7.1  1.7 - 7.7 K/uL Final  . Lymphocytes Relative 09/29/2019 16  % Final  . Lymphs Abs 09/29/2019 1.6  0.7 - 4.0 K/uL Final  . Monocytes Relative 09/29/2019 10  % Final  . Monocytes Absolute 09/29/2019 1.0  0 - 1 K/uL Final  . Eosinophils Relative 09/29/2019 1  % Final  . Eosinophils Absolute 09/29/2019 0.1  0 - 0 K/uL Final  . Basophils Relative 09/29/2019 0  % Final  . Basophils Absolute 09/29/2019 0.0  0 - 0 K/uL Final  . Immature Granulocytes 09/29/2019 1  % Final  . Abs Immature Granulocytes 09/29/2019 0.05  0.00 - 0.07 K/uL Final   Performed at [facility], [address] [place], [place] [address]    Assessment: 19 y.o. s/p 1LTCS stable, postpartum depression  Plan: Patient has done well after surgery with no apparent complications.  I have discussed the post-operative course to date, and the expected progress moving forward.  The patient understands what complications to be concerned about.  I will see the patient in routine follow up, or sooner if needed.    Activity plan: No heavy lifting.  Start zoloft 50mg  po daily   , MD, Vena Austria OB/GYN, [facility] 10/19/2019, 1:41 PM

## 2019-11-10 ENCOUNTER — Other Ambulatory Visit: Payer: Self-pay | Admitting: Obstetrics and Gynecology

## 2019-11-12 NOTE — Telephone Encounter (Signed)
Pt has a PP visit with you tomorrow

## 2019-11-13 ENCOUNTER — Other Ambulatory Visit: Payer: Self-pay

## 2019-11-13 ENCOUNTER — Ambulatory Visit (INDEPENDENT_AMBULATORY_CARE_PROVIDER_SITE_OTHER): Payer: Managed Care, Other (non HMO) | Admitting: Obstetrics and Gynecology

## 2019-11-13 ENCOUNTER — Encounter: Payer: Self-pay | Admitting: Obstetrics and Gynecology

## 2019-11-13 DIAGNOSIS — E041 Nontoxic single thyroid nodule: Secondary | ICD-10-CM

## 2019-11-13 DIAGNOSIS — Z124 Encounter for screening for malignant neoplasm of cervix: Secondary | ICD-10-CM

## 2019-11-13 DIAGNOSIS — Z1329 Encounter for screening for other suspected endocrine disorder: Secondary | ICD-10-CM

## 2019-11-13 NOTE — Progress Notes (Signed)
Postpartum Visit  Chief Complaint:  Chief Complaint  Patient presents with   Postpartum Care    Delivered C/S 5/28    History of Present Illness: Patient is a 19 y.o. G2P1011 presents for postpartum visit.  Date of delivery: 09/27/2016 Cesarean Section: Failure to progress Pregnancy or labor problems:  no Any problems since the delivery:  no  Newborn Details:  SINGLETON :  1. BabyGender female. Birth weight: 9lbs 11oz Maternal Details:  Breast or formula feeding: plans to breastfeed Contraception after delivery: No  Any bowel or bladder issues: No  Post partum depression/anxiety noted:  no Edinburgh Post-Partum Depression Score:10  Review of Systems: Review of Systems  Constitutional: Negative.   Gastrointestinal: Negative.   Genitourinary: Negative.     The following portions of the patient's history were reviewed and updated as appropriate: allergies, current medications, past family history, past medical history, past social history, past surgical history and problem list.  Past Medical History:  Past Medical History:  Diagnosis Date   Asthma    GERD (gastroesophageal reflux disease)     Past Surgical History:  Past Surgical History:  Procedure Laterality Date   CESAREAN SECTION N/A 09/28/2019   Procedure: CESAREAN SECTION;  Surgeon: Vena Austria, MD;  Location: [facility] ORS;  Service: Obstetrics;  Laterality: N/A;  VQM:[id number] apgar-8,9 weight 9lbs 11oz   TONSILLECTOMY     TYMPANOSTOMY TUBE PLACEMENT      Family History:  Family History  Problem Relation Age of Onset   Skin cancer Father     Social History:  Social History   Socioeconomic History   Marital status: Married    Spouse name: Gardiner Barefoot   Number of children: 0   Years of education: 13   Highest education level: High school graduate  Occupational History   Occupation: Unemployed  Tobacco Use   Smoking status: Never Smoker   Smokeless tobacco: Former Neurosurgeon     Types: Associate Professor Use: Former   Devices: quit 2020  Substance and Sexual Activity   Alcohol use: Yes    Comment: social   Drug use: No   Sexual activity: Yes    Birth control/protection: Other-see comments    Comment: undecided  Other Topics Concern   Not on file  Social History Narrative   Not on file   Social Determinants of Health   Financial Resource Strain:    Difficulty of Paying Living Expenses:   Food Insecurity:    Worried About Programme researcher, broadcasting/film/video in the Last Year:    Barista in the Last Year:   Transportation Needs:    Freight forwarder (Medical):    Lack of Transportation (Non-Medical):   Physical Activity:    Days of Exercise per Week:    Minutes of Exercise per Session:   Stress:    Feeling of Stress :   Social Connections:    Frequency of Communication with Friends and Family:    Frequency of Social Gatherings with Friends and Family:    Attends Religious Services:    Active Member of Clubs or Organizations:    Attends Banker Meetings:    Marital Status:   Intimate Partner Violence:    Fear of Current or Ex-Partner:    Emotionally Abused:    Physically Abused:    Sexually Abused:     Allergies:  No Known Allergies  Medications: Prior to Admission medications   Medication Sig Start  Date End Date Taking? Authorizing Provider  Prenatal Vit-Fe Fumarate-FA (PRENATAL MULTIVITAMIN) TABS tablet Take 1 tablet by mouth daily at 12 noon.    [provider]  sertraline (ZOLOFT) 50 MG tablet Take 1 tablet (50 mg total) by mouth daily. 10/19/19   Vena Austria, MD    Physical Exam Blood pressure 122/70, height 5\' 7"  (1.702 m), weight 184 lb (83.5 kg), last menstrual period 10/30/2019, currently breastfeeding.   General: NAD HEENT: normocephalic, anicteric Pulmonary: No increased work of breathing Abdomen: NABS, soft, non-tender, non-distended.  Umbilicus without lesions.   No hepatomegaly, splenomegaly or masses palpable. No evidence of hernia. Incision D/C/I Genitourinary:  External: Normal external female genitalia.  Normal urethral meatus, normal  Bartholin's and Skene's glands.    Vagina: Normal vaginal mucosa, no evidence of prolapse.    Cervix: Grossly normal in appearance, no bleeding  Uterus: Non-enlarged, mobile, normal contour.  No CMT  Adnexa: ovaries non-enlarged, no adnexal masses  Rectal: deferred Extremities: no edema, erythema, or tenderness Neurologic: Grossly intact Psychiatric: mood appropriate, affect full   Edinburgh Postnatal Depression Scale - 11/13/19 1508      Edinburgh Postnatal Depression Scale:  In the Past 7 Days   I have been able to laugh and see the funny side of things. 0    I have looked forward with enjoyment to things. 0    I have blamed myself unnecessarily when things went wrong. 2    I have been anxious or worried for no good reason. 2    I have felt scared or panicky for no good reason. 2    Things have been getting on top of me. 2    I have been so unhappy that I have had difficulty sleeping. 0    I have felt sad or miserable. 1    I have been so unhappy that I have been crying. 1    The thought of harming myself has occurred to me. 0    Edinburgh Postnatal Depression Scale Total 10            Edinburgh Postnatal Depression Scale - 11/13/19 1508      Edinburgh Postnatal Depression Scale:  In the Past 7 Days   I have been able to laugh and see the funny side of things. 0    I have looked forward with enjoyment to things. 0    I have blamed myself unnecessarily when things went wrong. 2    I have been anxious or worried for no good reason. 2    I have felt scared or panicky for no good reason. 2    Things have been getting on top of me. 2    I have been so unhappy that I have had difficulty sleeping. 0    I have felt sad or miserable. 1    I have been so unhappy that I have been crying. 1    The thought  of harming myself has occurred to me. 0    Edinburgh Postnatal Depression Scale Total 10            Assessment: 19 y.o. 36 presenting for 6 week postpartum visit  Plan: Problem List Items Addressed This Visit      Endocrine   Thyroid nodule   Relevant Orders   Thyroid Panel With TSH   [id number] THYROID    Other Visit Diagnoses    6 weeks postpartum follow-up    -  Primary   Screening for malignant  neoplasm of cervix       Relevant Orders   IGP, rfx Aptima HPV ASCU   Thyroid disorder screening       Relevant Orders   Thyroid Panel With TSH      1) Contraception - Education given regarding options for contraception, as well as compatibility with breast feeding if applicable.  Patient plans on condoms for contraception.  2)  Pap - ASCCP guidelines and rational discussed.  ASCCP guidelines and rational discussed.  Patient opts for every 3 years screening interval  3) Patient underwent screening for postpartum depression with no signs of depression  4) Thyroid nodule - follow up ultrasound and repeat thyroid labs  5) Return in about 1 year (around 11/12/2020) for annual.   Vena Austria, MD, [facility], [facility] 11/13/2019, 3:39 PM

## 2019-11-14 LAB — THYROID PANEL WITH TSH
Free Thyroxine Index: 1.6 (ref 1.2–4.9)
T3 Uptake Ratio: 28 % (ref 24–39)
T4, Total: 5.8 ug/dL (ref 4.5–12.0)
TSH: 0.077 u[IU]/mL — ABNORMAL LOW (ref 0.450–4.500)

## 2019-11-15 ENCOUNTER — Telehealth: Payer: Self-pay

## 2019-11-15 NOTE — Telephone Encounter (Signed)
Pt called after hour nurse 11/14/19 10:57pm; was recently seen for 6wk pp and pap; now having burning c and without urination and at the end; is breastfeeding and would like to know what she can take; no fever.  [phone]  VM not available.

## 2019-11-19 NOTE — Telephone Encounter (Signed)
Called and spoke with patient about scheduling for an office visit. Patient report symptoms are longer there and doesn't wish to scheduled at this time.

## 2019-11-21 LAB — IGP, RFX APTIMA HPV ASCU

## 2019-11-21 LAB — HPV APTIMA: HPV Aptima: NEGATIVE

## 2019-11-23 ENCOUNTER — Ambulatory Visit
Admission: RE | Admit: 2019-11-23 | Discharge: 2019-11-23 | Disposition: A | Discharge status: Home / Self Care | Payer: Managed Care, Other (non HMO) | Source: Ambulatory Visit | Attending: Obstetrics and Gynecology | Admitting: Obstetrics and Gynecology

## 2019-11-23 ENCOUNTER — Other Ambulatory Visit: Payer: Self-pay

## 2019-11-23 DIAGNOSIS — E041 Nontoxic single thyroid nodule: Secondary | ICD-10-CM | POA: Diagnosis present

## 2019-12-27 ENCOUNTER — Ambulatory Visit (INDEPENDENT_AMBULATORY_CARE_PROVIDER_SITE_OTHER): Payer: Managed Care, Other (non HMO) | Admitting: Certified Nurse Midwife

## 2019-12-27 ENCOUNTER — Telehealth: Payer: Self-pay

## 2019-12-27 ENCOUNTER — Other Ambulatory Visit: Payer: Self-pay

## 2019-12-27 ENCOUNTER — Encounter: Payer: Self-pay | Admitting: Certified Nurse Midwife

## 2019-12-27 VITALS — BP 106/65 | HR 93 | Temp 101.2°F | Ht 67.0 in | Wt 176.0 lb

## 2019-12-27 DIAGNOSIS — O9122 Nonpurulent mastitis associated with the puerperium: Secondary | ICD-10-CM | POA: Insufficient documentation

## 2019-12-27 DIAGNOSIS — R300 Dysuria: Secondary | ICD-10-CM

## 2019-12-27 LAB — POCT URINALYSIS DIPSTICK
Blood, UA: NEGATIVE
Glucose, UA: NEGATIVE
Leukocytes, UA: NEGATIVE
Nitrite, UA: NEGATIVE
Protein, UA: POSITIVE — AB
Spec Grav, UA: 1.015 (ref 1.010–1.025)
Urobilinogen, UA: NEGATIVE E.U./dL — AB
pH, UA: 6 (ref 5.0–8.0)

## 2019-12-27 MED ORDER — SERTRALINE HCL 50 MG PO TABS: 50.0000 mg | ORAL_TABLET | Freq: Every day | ORAL | 1 refills | Status: DC

## 2019-12-27 MED ORDER — SULFAMETHOXAZOLE-TRIMETHOPRIM 800-160 MG PO TABS
1.0000 | ORAL_TABLET | Freq: Two times a day (BID) | ORAL | 0 refills | Status: DC
Start: 2019-12-27 — End: 2020-01-08

## 2019-12-27 NOTE — Telephone Encounter (Signed)
Pt left msg on triage complaining of right breast pain, redness and having fever. Pt has been added to CLG's schedule this pm.

## 2019-12-27 NOTE — Progress Notes (Addendum)
Obstetrics & Gynecology Office Visit   Chief Complaint:  Chief Complaint  Patient presents with  . Breast Problem    fever, highest was 100.8; fatigue; h/a; breast pain; lower back pain; urinary burning; greeny brown sediment in pumped milk. really thirsty.    History of Present Illness: 19 year old G2 P1011, now 3 months postpartum, who presents with complaints of malaise, fever to 100.8, and a tender red area on her inner right breast x 2-3 days. She has also noticed a green brown sediment in her milk pumped from the right breast. She is pumping only and usually pumps every 3-4 hours during the day and 3 times from 11PM to 10:30 AM. This has not changed. Denies any trauma or bleeding of the nipples. She has felt body aches and lower back pain and has also had some intermittent dysuria recently. Denies vulvar itching or irritation. Baby is growing well. Weighs 12 or 13#.  She also reports that her insurance requires her to get 90 day supply of her sertraline.    Review of Systems:  Review of Systems  Constitutional: Positive for chills, fever and malaise/fatigue.  Genitourinary: Positive for dysuria.       Denies vulvar irritation or itching.  Musculoskeletal: Positive for back pain.       Positive for body aches  Skin: Positive for rash (on right inner breast).  Neurological: Positive for headaches.  Endo/Heme/Allergies: Positive for polydipsia.     Past Medical History:  Past Medical History:  Diagnosis Date  . Asthma   . GERD (gastroesophageal reflux disease)     Past Surgical History:  Past Surgical History:  Procedure Laterality Date  . CESAREAN SECTION N/A 09/28/2019   Procedure: CESAREAN SECTION;  Surgeon: Vena Austria, MD;  Location: [facility] ORS;  Service: Obstetrics;  Laterality: N/A;  IWP:[id number] [id number] apgar-8,9 weight 9lbs 11oz  . TONSILLECTOMY    . TYMPANOSTOMY TUBE PLACEMENT      Gynecologic History: No LMP recorded (lmp unknown).  Obstetric  History: G2P1011  Family History:  Family History  Problem Relation Age of Onset  . Skin cancer Father     Social History:  Social History   Socioeconomic History  . Marital status: Married    Spouse name: Gardiner Barefoot  . Number of children: 0  . Years of education: 41  . Highest education level: High school graduate  Occupational History  . Occupation: Unemployed  Tobacco Use  . Smoking status: Never Smoker  . Smokeless tobacco: Former Neurosurgeon    Types: Engineer, drilling  . Vaping Use: Former  . Devices: quit 2020  Substance and Sexual Activity  . Alcohol use: Yes    Comment: social  . Drug use: No  . Sexual activity: Yes    Birth control/protection: Other-see comments    Comment: undecided  Other Topics Concern  . Not on file  Social History Narrative  . Not on file   Social Determinants of Health   Financial Resource Strain:   . Difficulty of Paying Living Expenses: Not on file  Food Insecurity:   . Worried About Programme researcher, broadcasting/film/video in the Last Year: Not on file  . Ran Out of Food in the Last Year: Not on file  Transportation Needs:   . Lack of Transportation (Medical): Not on file  . Lack of Transportation (Non-Medical): Not on file  Physical Activity:   . Days of Exercise per Week: Not on file  . Minutes of Exercise per  Session: Not on file  Stress:   . Feeling of Stress : Not on file  Social Connections:   . Frequency of Communication with Friends and Family: Not on file  . Frequency of Social Gatherings with Friends and Family: Not on file  . Attends Religious Services: Not on file  . Active Member of Clubs or Organizations: Not on file  . Attends Banker Meetings: Not on file  . Marital Status: Not on file  Intimate Partner Violence:   . Fear of Current or Ex-Partner: Not on file  . Emotionally Abused: Not on file  . Physically Abused: Not on file  . Sexually Abused: Not on file    Allergies:  No Known Allergies  Medications: Prior  to Admission medications   Medication Sig Start Date End Date Taking? Authorizing Provider  Prenatal Vit-Fe Fumarate-FA (PRENATAL MULTIVITAMIN) TABS tablet Take 1 tablet by mouth daily at 12 noon.   Yes [provider]  sertraline (ZOLOFT) 50 MG tablet Take 1 tablet (50 mg total) by mouth daily. 10/19/19  Yes Vena Austria, MD    Physical Exam Vitals: BP 106/65   Pulse 93   Temp (!) 101.2 F (38.4 C)   Ht 5\' 7"  (1.702 m)   Wt 176 lb (79.8 kg)   LMP  (LMP Unknown)   BMI 27.57 kg/m   Physical Exam Constitutional:      General: She is not in acute distress.    Appearance: She is not diaphoretic.  Chest:       Comments: Breasts: Right breast: area of inflammation from 1-5 o'clock that is warm and tender to touch. No masses. Nipple intact but appears bruised at 6 o'clock. White milk expressed from nipple. No axillary LAN. Left breast: no inflammation, masses, or skin changes.  Musculoskeletal:        General: Normal range of motion.  Neurological:     Mental Status: She is alert and oriented to person, place, and time.  Psychiatric:        Mood and Affect: Mood normal.    Results for orders placed or performed in visit on 12/27/19 (from the past 24 hour(s))  POCT urinalysis dipstick     Status: Abnormal   Collection Time: 12/27/19  1:56 PM  Result Value Ref Range   Color, UA     Clarity, UA     Glucose, UA Negative Negative   Bilirubin, UA 1+    Ketones, UA trace    Spec Grav, UA 1.015 1.010 - 1.025   Blood, UA neg    pH, UA 6.0 5.0 - 8.0   Protein, UA Positive (A) Negative   Urobilinogen, UA negative (A) 0.2 or 1.0 E.U./dL   Nitrite, UA neg    Leukocytes, UA Negative Negative   Appearance     Odor      Assessment: 19 y.o. G2P1011 with right mastitis R/T lactation Dysuria: R/O UTI  Plan: Start Bactrim DS BID x 10 days; avoid prolonged exposure to sun/tanning bed Sertraline 50 mgm -one daily #90/ RF x1 Warm compresses to breast prior to pumping.    Follow up in 10 days Urine culture sent Increase water intake. Motrin or Tylenol for pain/fever.  36, CNM

## 2019-12-28 ENCOUNTER — Ambulatory Visit: Payer: Managed Care, Other (non HMO) | Admitting: Obstetrics

## 2019-12-29 LAB — URINE CULTURE: Organism ID, Bacteria: NO GROWTH

## 2020-01-08 ENCOUNTER — Other Ambulatory Visit: Payer: Self-pay

## 2020-01-08 ENCOUNTER — Ambulatory Visit (INDEPENDENT_AMBULATORY_CARE_PROVIDER_SITE_OTHER): Payer: Managed Care, Other (non HMO) | Admitting: Obstetrics

## 2020-01-08 ENCOUNTER — Encounter: Payer: Self-pay | Admitting: Obstetrics

## 2020-01-08 VITALS — BP 114/72 | HR 80 | Resp 18 | Ht 67.0 in | Wt 175.0 lb

## 2020-01-08 DIAGNOSIS — O9122 Nonpurulent mastitis associated with the puerperium: Secondary | ICD-10-CM

## 2020-01-08 NOTE — Progress Notes (Signed)
Obstetrics & Gynecology Office Visit   Chief Complaint:  Chief Complaint  Patient presents with  . Postpartum Care   Follow up on Mastitis  History of Present Illness: Annalyse return to the office two weeks after receiving a dx of Mastitis. She was seen by Dot Been, CNM and was placed on antibiotics. She returns for follow up.   Review of Systems:  Review of Systems  Constitutional: Negative.   HENT: Negative.   Eyes: Negative.   Cardiovascular: Negative.   Gastrointestinal: Negative.   Genitourinary: Negative.   Musculoskeletal: Negative.   Skin: Negative.   Neurological: Negative.   Endo/Heme/Allergies: Negative.   Psychiatric/Behavioral: Negative.      Past Medical History:  Past Medical History:  Diagnosis Date  . Asthma   . GERD (gastroesophageal reflux disease)     Past Surgical History:  Past Surgical History:  Procedure Laterality Date  . CESAREAN SECTION N/A 09/28/2019   Procedure: CESAREAN SECTION;  Surgeon: Vena Austria, MD;  Location: [facility] ORS;  Service: Obstetrics;  Laterality: N/A;  JTT:SVXBLT tob-2241 apgar-8,9 weight 9lbs 11oz  . TONSILLECTOMY    . TYMPANOSTOMY TUBE PLACEMENT      Gynecologic History: No LMP recorded (lmp unknown).  Obstetric History: G2P1011  Family History:  Family History  Problem Relation Age of Onset  . Skin cancer Father     Social History:  Social History   Socioeconomic History  . Marital status: Married    Spouse name: Gardiner Barefoot  . Number of children: 0  . Years of education: 28  . Highest education level: High school graduate  Occupational History  . Occupation: Unemployed  Tobacco Use  . Smoking status: Never Smoker  . Smokeless tobacco: Former Neurosurgeon    Types: Engineer, drilling  . Vaping Use: Former  . Devices: quit 2020  Substance and Sexual Activity  . Alcohol use: Yes    Comment: social  . Drug use: No  . Sexual activity: Yes    Birth control/protection: Other-see comments     Comment: undecided  Other Topics Concern  . Not on file  Social History Narrative  . Not on file   Social Determinants of Health   Financial Resource Strain:   . Difficulty of Paying Living Expenses: Not on file  Food Insecurity:   . Worried About Programme researcher, broadcasting/film/video in the Last Year: Not on file  . Ran Out of Food in the Last Year: Not on file  Transportation Needs:   . Lack of Transportation (Medical): Not on file  . Lack of Transportation (Non-Medical): Not on file  Physical Activity:   . Days of Exercise per Week: Not on file  . Minutes of Exercise per Session: Not on file  Stress:   . Feeling of Stress : Not on file  Social Connections:   . Frequency of Communication with Friends and Family: Not on file  . Frequency of Social Gatherings with Friends and Family: Not on file  . Attends Religious Services: Not on file  . Active Member of Clubs or Organizations: Not on file  . Attends Banker Meetings: Not on file  . Marital Status: Not on file  Intimate Partner Violence:   . Fear of Current or Ex-Partner: Not on file  . Emotionally Abused: Not on file  . Physically Abused: Not on file  . Sexually Abused: Not on file    Allergies:  No Known Allergies  Medications: Prior to Admission medications  Medication Sig Start Date End Date Taking? Authorizing Provider  Prenatal Vit-Fe Fumarate-FA (PRENATAL MULTIVITAMIN) TABS tablet Take 1 tablet by mouth daily at 12 noon.    [provider]  sertraline (ZOLOFT) 50 MG tablet Take 1 tablet (50 mg total) by mouth daily. 12/27/19   Farrel Conners, CNM    Physical Exam Vitals:  Vitals:   01/08/20 1415  BP: 114/72  Pulse: 80  Resp: 18  SpO2: 98%   No LMP recorded (lmp unknown).  Physical Exam Constitutional:      Appearance: Normal appearance.  HENT:     Head: Normocephalic and atraumatic.  Cardiovascular:     Rate and Rhythm: Regular rhythm.     Pulses: Normal pulses.     Heart sounds:  Normal heart sounds.  Pulmonary:     Effort: Pulmonary effort is normal.     Breath sounds: Normal breath sounds.  Genitourinary:    Comments: Breast exam: breasts are lactating bilaterally, are non engorged, without nipple trauma. No reddened areas noted. No nipple discharge. No hardened areas or blocked ducts palpated. Musculoskeletal:        General: Normal range of motion.     Cervical back: Normal range of motion and neck supple.  Skin:    General: Skin is warm and dry.  Neurological:     General: No focal deficit present.     Mental Status: She is alert and oriented to person, place, and time.  Psychiatric:        Mood and Affect: Mood normal.        Behavior: Behavior normal.      Assessment: 19 y.o. G2P1011 for follow up on Mastitis. Mastitis resolved.   Plan: Problem List Items Addressed This Visit      Other   Mastitis, obstetric, postpartum condition - Primary     Birdella has struggled with the ongoing pumping. She has not been directly breastfeeding her baby, but almost since birth has been pumping and giving the baby bottles. After discussing this with her husband, she would like to wean, and switch to formula. She has a large stock of breastmilk in her deep freeze. We discussed this at length and I reviewed the benefits of protection her milk provides. She is then assisted in how to slowly decrease her milk supply with less pumping. I have also encouraged her to discuss birth control with her husband as her fertility will return with weaning. They are opting for condoms. Offered hormonal methods and LARCs. Encouraged to continue on a daily vitamin with folic. She will RTC if she opts for other than condoms for [facility]. Mirna Mires, CNM  01/08/2020 5:55 PM

## 2020-02-11 ENCOUNTER — Ambulatory Visit (INDEPENDENT_AMBULATORY_CARE_PROVIDER_SITE_OTHER): Payer: Managed Care, Other (non HMO) | Admitting: Obstetrics

## 2020-02-11 ENCOUNTER — Encounter: Payer: Self-pay | Admitting: Obstetrics

## 2020-02-11 ENCOUNTER — Other Ambulatory Visit: Payer: Self-pay

## 2020-02-11 VITALS — BP 104/50 | HR 60 | Ht 67.0 in | Wt 180.0 lb

## 2020-02-11 DIAGNOSIS — Z30018 Encounter for initial prescription of other contraceptives: Secondary | ICD-10-CM

## 2020-02-11 NOTE — Progress Notes (Signed)
Patient is a 19 y.o. [id number] presenting for contraception consult.  She is currently on none and desiring to start either an IUD or possibly Depo. she is still somewhat undecided..  She has a past medical history insignificant, has no: migraine with aura, chronic hypertension, history of DVT/PE, smoking and no contraindication to estrogen.  She specifically denies a history of migraine with aura, chronic hypertension, history of DVT/PE and smoking.  Reported Patient's last menstrual period was 01/29/2020 (exact date)..   She recently had her 6 week PP visit. O: A total of 20 minutes was spent with the patient discussing her needs and priorities for [facility]. She is leaning toward an IUD placement, and will make an appointment. She is not sure whether she will use a Paragard or Mirena. Both methods discussed and the risk, benefits of both reviewed.   Mirna Mires, CNM  02/11/2020 6:14 PM

## 2020-02-25 ENCOUNTER — Telehealth: Payer: Self-pay | Admitting: Obstetrics and Gynecology

## 2020-02-25 NOTE — Telephone Encounter (Signed)
Patient is scheduled for mirena placement with ABC on 03/03/20 at 9:50

## 2020-02-26 NOTE — Telephone Encounter (Signed)
Noted. Mirena reserved for this patient. 

## 2020-03-03 ENCOUNTER — Ambulatory Visit: Payer: Managed Care, Other (non HMO) | Admitting: Obstetrics and Gynecology

## 2020-03-03 NOTE — Telephone Encounter (Signed)
Patient calling with her menstrual cycle not starting and wanting to rescheduled. Patient is reschedule 03/05/20 at 8:10 with ABC

## 2020-03-04 NOTE — Telephone Encounter (Signed)
Noted  

## 2020-03-04 NOTE — Progress Notes (Deleted)
   No chief complaint on file.    IUD PROCEDURE NOTE:  Sara Henson is a 19 y.o. G2P1011 here for Mirena  IUD insertion for [facility]. Pt is PP   There were no vitals taken for this visit.  IUD Insertion Procedure Note Patient identified, informed consent performed, consent signed.   Discussed risks of irregular bleeding, cramping, infection, malpositioning or misplacement of the IUD outside the uterus which may require further procedure such as laparoscopy, risk of failure <1%. Time out was performed.  Urine pregnancy test negative.***  Speculum placed in the vagina.  Cervix visualized.  Cleaned with Betadine x 2.  Grasped anteriorly with a single tooth tenaculum.  Uterus sounded to *** cm.   IUD placed per manufacturer's recommendations.  Strings trimmed to 3 cm. Tenaculum was removed, good hemostasis noted.  Patient tolerated procedure well.   ASSESSMENT:  No diagnosis found.   No orders of the defined types were placed in this encounter.    Plan:  Patient was given post-procedure instructions.  She was advised to have backup contraception for one week.   Call if you are having increasing pain, cramps or bleeding or if you have a fever greater than 100.4 degrees F., shaking chills, nausea or vomiting. Patient was also asked to check IUD strings periodically and follow up in 4 weeks for IUD check.  No follow-ups on file.  Avier Jech B. Mats Jeanlouis, PA-C 03/04/2020 8:50 AM

## 2020-03-05 ENCOUNTER — Ambulatory Visit (INDEPENDENT_AMBULATORY_CARE_PROVIDER_SITE_OTHER): Payer: Managed Care, Other (non HMO) | Admitting: Advanced Practice Midwife

## 2020-03-05 ENCOUNTER — Ambulatory Visit: Payer: Managed Care, Other (non HMO) | Admitting: Obstetrics and Gynecology

## 2020-03-05 ENCOUNTER — Other Ambulatory Visit: Payer: Self-pay

## 2020-03-05 ENCOUNTER — Encounter: Payer: Self-pay | Admitting: Advanced Practice Midwife

## 2020-03-05 VITALS — BP 118/74 | Ht 67.0 in | Wt 183.0 lb

## 2020-03-05 DIAGNOSIS — Z30430 Encounter for insertion of intrauterine contraceptive device: Secondary | ICD-10-CM

## 2020-03-05 NOTE — Telephone Encounter (Signed)
Patient was late for appointment today was rescheduled to 03/05/20 at 1:30 with JEG

## 2020-03-05 NOTE — Patient Instructions (Signed)

## 2020-03-05 NOTE — Progress Notes (Signed)
   GYNECOLOGY OFFICE PROCEDURE NOTE  Sara Henson is a 19 y.o. G2P1011 here for Mirena IUD insertion. No GYN concerns.  Last pap smear was on 2021 and was normal.  The patient is currently using condoms for contraception and her LMP is Patient's last menstrual period was 03/04/2020..  The indication for her IUD is contraception/cycle control.  Review of Systems  Constitutional: Negative for chills and fever.  HENT: Negative for congestion, ear discharge, ear pain, hearing loss, sinus pain and sore throat.   Eyes: Negative for blurred vision and double vision.  Respiratory: Negative for cough, shortness of breath and wheezing.   Cardiovascular: Negative for chest pain, palpitations and leg swelling.  Gastrointestinal: Negative for abdominal pain, blood in stool, constipation, diarrhea, heartburn, melena, nausea and vomiting.  Genitourinary: Negative for dysuria, flank pain, frequency, hematuria and urgency.  Musculoskeletal: Negative for back pain, joint pain and myalgias.  Skin: Negative for itching and rash.  Neurological: Negative for dizziness, tingling, tremors, sensory change, speech change, focal weakness, seizures, loss of consciousness, weakness and headaches.  Endo/Heme/Allergies: Negative for environmental allergies. Does not bruise/bleed easily.  Psychiatric/Behavioral: Negative for depression, hallucinations, memory loss, substance abuse and suicidal ideas. The patient is not nervous/anxious and does not have insomnia.        Positive for anxiety and depression   Vital Signs: BP 118/74   Ht 5\' 7"  (1.702 m)   Wt 183 lb (83 kg)   LMP 03/04/2020   BMI 28.66 kg/m  Constitutional: Well nourished, well developed female in no acute distress.  HEENT: normal Skin: Warm and dry.  Respiratory: Clear to auscultation bilateral. Normal respiratory effort Neuro: DTRs 2+, Cranial nerves grossly intact Psych: Alert and Oriented x3. No memory deficits. Normal mood and affect.  MS: normal  gait, normal bilateral lower extremity ROM/strength/stability.  Pelvic exam: (female chaperone present) is not limited by body habitus EGBUS: within normal limits Vagina: within normal limits and with normal mucosa, blood in the vault- currently on period Cervix: normal appearance   IUD Insertion Procedure Note Patient identified, informed consent performed, consent signed.   Discussed risks of irregular bleeding, cramping, infection, malpositioning, expulsion or uterine perforation of the IUD (1:1000 placements)  which may require further procedure such as laparoscopy.  IUD while effective at preventing pregnancy do not prevent transmission of sexually transmitted diseases and use of barrier methods for this purpose was discussed. Time out was performed.  Urine pregnancy test negative.  Speculum placed in the vagina.  Cervix visualized.  Cleaned with Betadine x 2.  Grasped posteriorly with a single tooth tenaculum.  Uterus sounded to 7 cm. IUD placed per manufacturer's recommendations.  Strings trimmed to 3 cm. Tenaculum was removed, good hemostasis noted.  Patient tolerated procedure well.   Patient was given post-procedure instructions.  She was advised to have backup contraception for one week.  Patient was also asked to check IUD strings periodically and follow up in 4-6 weeks for IUD check.  IUD insertion CPT 58300,  Skyla [id number] Mirena [id number] Liletta J7297 Paraguard J7300 13/06/2019 [phone] Modifer 25, plus Modifer 79 is done during a global billing visit    [id number], CNM [facility]

## 2020-04-02 ENCOUNTER — Other Ambulatory Visit: Payer: Self-pay

## 2020-04-02 ENCOUNTER — Ambulatory Visit (INDEPENDENT_AMBULATORY_CARE_PROVIDER_SITE_OTHER): Payer: Managed Care, Other (non HMO) | Admitting: Obstetrics

## 2020-04-02 VITALS — BP 126/84 | Wt 184.0 lb

## 2020-04-02 DIAGNOSIS — Z30431 Encounter for routine checking of intrauterine contraceptive device: Secondary | ICD-10-CM

## 2020-04-02 DIAGNOSIS — Z975 Presence of (intrauterine) contraceptive device: Secondary | ICD-10-CM | POA: Diagnosis not present

## 2020-04-02 NOTE — Progress Notes (Signed)
   IUD String Check  Subjctive: Sara Henson presents for IUD string check.  She had a Mirena placed 1 month ago.  Since placement of her IUD she had occasional light  vaginal bleeding.  She denies cramping or discomfort.  She has had intercourse since placement.  She has not checked the strings.  She denies any fever, chills, nausea, vomiting, or other complaints.    Objective: BP 126/84   Wt 184 lb (83.5 kg)   LMP 03/04/2020   BMI 28.82 kg/m  Physical Exam  Female chaperone was present for the entirety of the pelvic exam  Assessment: 19 y.o. year old female status post prior Mirena IUD placement 4 week ago, doing well.  Plan: 1.  The patient was given instructions to check her IUD strings monthly and call with any problems or concerns.  She should call for fevers, chills, abnormal vaginal discharge, pelvic pain, or other complaints. 2.  She will return for a annual exam in 1 year.  All questions answered.  15 minutes spent in face to face discussion with > 50% spent in counseling, management, and coordination of care for her newly-placed IUD.  Risks and benefits of IUD discussed including the risks of irregular bleeding, cramping, infection, malpositioning, expulsion, which may require further procedures such as laparoscopy.  IUDs while effective at preventing pregnancy do not prevent transmission of sexually transmitted diseases and use of barrier methods for this purpose was discussed.  Low overall incidence of failure with 99.7% efficacy rate in typical use.    Sara Henson, CNM  04/02/2020 9:36 AM   04/02/2020 9:28 AM

## 2020-05-05 ENCOUNTER — Ambulatory Visit
Admission: EM | Admit: 2020-05-05 | Discharge: 2020-05-05 | Disposition: A | Discharge status: Home / Self Care | Payer: Managed Care, Other (non HMO) | Attending: Physician Assistant | Admitting: Physician Assistant

## 2020-05-05 ENCOUNTER — Ambulatory Visit (INDEPENDENT_AMBULATORY_CARE_PROVIDER_SITE_OTHER): Payer: Self-pay

## 2020-05-05 ENCOUNTER — Other Ambulatory Visit: Payer: Self-pay

## 2020-05-05 DIAGNOSIS — M545 Low back pain, unspecified: Secondary | ICD-10-CM

## 2020-05-05 DIAGNOSIS — M5442 Lumbago with sciatica, left side: Secondary | ICD-10-CM

## 2020-05-05 LAB — PREGNANCY, URINE: Preg Test, Ur: NEGATIVE

## 2020-05-05 MED ORDER — CYCLOBENZAPRINE HCL 10 MG PO TABS
10.0000 mg | ORAL_TABLET | Freq: Three times a day (TID) | ORAL | 0 refills | Status: AC | PRN
Start: 2020-05-05 — End: 2020-05-12

## 2020-05-05 MED ORDER — METHYLPREDNISOLONE 4 MG PO TBPK
ORAL_TABLET | ORAL | 0 refills | Status: DC
Start: 2020-05-05 — End: 2020-10-06

## 2020-05-05 NOTE — ED Provider Notes (Signed)
[facility]    CSN: [id number] Arrival date & time: 05/05/20  1401      History   Chief Complaint Chief Complaint  Patient presents with  . Back Pain  . Hip Pain    HPI Sara Henson is a 20 y.o. female presenting for lower back pain x 1.5 weeks.  Patient states that she just noticed this pain of the bilateral lower back is worse on left side.  She states that she will occasional radiation of pain to the left buttocks.  Denies any pain radiating further down the leg.  Denies any associated numbness, weakness or tingling.  Patient states that she is had similar problems about every 3 to 4 months over the past 5 years.  Patient states she did see her pediatrician about 5 years ago and was told she was just having muscle spasms.  She has been treating symptoms with naproxen, ibuprofen, Tylenol and trying to stretch.  Patient states that she has not had any improvement.  She says that now she has a constant steady ache of the lower back.  Pain is worse with bending forward.  Pain is worse with sitting or resting too long.  Patient does currently have a baby, but is not breast-feeding.  Patient states that when she had flareups in the past this only lasted a few days and then resolved.  She states that he has been going on for about 10 days or longer now and that is abnormal.  She has no other concerns.  HPI  Past Medical History:  Diagnosis Date  . Asthma   . Elevated blood pressure affecting pregnancy in third trimester, antepartum 08/14/2019  . GERD (gastroesophageal reflux disease)     Patient Active Problem List   Diagnosis Date Noted  . IUD (intrauterine device) in place 04/02/2020  . Mastitis, obstetric, postpartum condition 12/27/2019  . Gestational hypertension, third trimester   . Thyroid nodule 08/22/2019  . Subclinical hypothyroidism 08/14/2019  . GERD without esophagitis 04/30/2016  . Heartburn 04/30/2016  . Loss of weight 01/22/2013  . Disordered eating  01/22/2013  . Bruising 11/13/2012  . Chest pain 02/08/2012  . Murmur 02/08/2012  . Tachycardia 02/08/2012    Past Surgical History:  Procedure Laterality Date  . CESAREAN SECTION N/A 09/28/2019   Procedure: CESAREAN SECTION;  Surgeon: Vena Austria, MD;  Location: [facility] ORS;  Service: Obstetrics;  Laterality: N/A;  [id number] apgar-8,9 weight 9lbs 11oz  . TONSILLECTOMY    . TYMPANOSTOMY TUBE PLACEMENT      OB History    Gravida  2   Para  1   Term  1   Preterm      AB  1   Living  1     SAB  1   IAB      Ectopic      Multiple  0   Live Births  1            Home Medications    Prior to Admission medications   Medication Sig Start Date End Date Taking? Authorizing Provider  levonorgestrel (MIRENA) 20 MCG/24HR IUD 1 each by Intrauterine route once. 03/05/20 03/06/27 Yes Tresea Mall, CNM  sertraline (ZOLOFT) 50 MG tablet Take 1 tablet (50 mg total) by mouth daily. 12/27/19  Yes Farrel Conners, CNM  b complex vitamins capsule Take 1 capsule by mouth daily.    [provider]  Cranberry-Vit C-Lactobacillus (RA CRANBERRY SUPPLEMENTS) 450-30 MG TABS Take 2 tablets by  mouth daily.    [provider]  Prenatal Vit-Fe Fumarate-FA (PRENATAL MULTIVITAMIN) TABS tablet Take 1 tablet by mouth daily at 12 noon.    [provider]    Family History Family History  Problem Relation Age of Onset  . Skin cancer Father     Social History Social History   Tobacco Use  . Smoking status: Never Smoker  . Smokeless tobacco: Former Systems developer    Types: Secondary school teacher  . Vaping Use: Former  . Devices: quit 2020  Substance Use Topics  . Alcohol use: Not Currently  . Drug use: No     Allergies   Patient has no known allergies.   Review of Systems Review of Systems  Constitutional: Negative for fatigue and fever.  Respiratory: Negative for shortness of breath.   Cardiovascular: Negative for chest pain.  Gastrointestinal:  Negative for abdominal pain, nausea and vomiting.  Genitourinary: Negative for dysuria, flank pain, urgency and vaginal discharge.  Musculoskeletal: Positive for back pain.  Neurological: Negative for weakness and numbness.     Physical Exam Triage Vital Signs ED Triage Vitals  Enc Vitals Group     BP 05/05/20 1452 (!) 142/89     Pulse Rate 05/05/20 1452 81     Resp 05/05/20 1452 17     Temp 05/05/20 1452 98.2 F (36.8 C)     Temp Source 05/05/20 1452 Oral     SpO2 05/05/20 1452 100 %     Weight 05/05/20 1450 185 lb (83.9 kg)     Height 05/05/20 1450 5\' 7"  (1.702 m)     Head Circumference --      Peak Flow --      Pain Score 05/05/20 1450 3     Pain Loc --      Pain Edu? --      Excl. in [place]? --    No data found.  Updated Vital Signs BP (!) 142/89 (BP Location: Left Arm)   Pulse 81   Temp 98.2 F (36.8 C) (Oral)   Resp 17   Ht 5\' 7"  (1.702 m)   Wt 185 lb (83.9 kg)   SpO2 100%   BMI 28.98 kg/m    Physical Exam Vitals and nursing note reviewed.  Constitutional:      General: She is not in acute distress.    Appearance: Normal appearance. She is not ill-appearing or toxic-appearing.  HENT:     Head: Normocephalic and atraumatic.     Nose: Nose normal.     Mouth/Throat:     Mouth: Mucous membranes are moist.     Pharynx: Oropharynx is clear.  Eyes:     General: No scleral icterus.       Right eye: No discharge.        Left eye: No discharge.     Conjunctiva/sclera: Conjunctivae normal.  Cardiovascular:     Rate and Rhythm: Normal rate and regular rhythm.     Heart sounds: Normal heart sounds.  Pulmonary:     Effort: Pulmonary effort is normal. No respiratory distress.     Breath sounds: Normal breath sounds.  Musculoskeletal:     Cervical back: Neck supple.     Lumbar back: Tenderness (mild TTP left paralumbar region diffusely) and bony tenderness (L3-L5) present. No swelling. Normal range of motion. Negative right straight leg raise test and negative left  straight leg raise test.  Skin:    General: Skin is dry.  Neurological:  General: No focal deficit present.     Mental Status: She is alert. Mental status is at baseline.     Motor: No weakness.     Gait: Gait normal.  Psychiatric:        Mood and Affect: Mood normal.        Behavior: Behavior normal.        Thought Content: Thought content normal.      UC Treatments / Results  Labs (all labs ordered are listed, but only abnormal results are displayed) Labs Reviewed - No data to display  EKG   Radiology No results found.  Procedures Procedures (including critical care time)  Medications Ordered in UC Medications - No data to display  Initial Impression / Assessment and Plan / UC Course  I have reviewed the triage vital signs and the nursing notes.  Pertinent labs & imaging results that were available during my care of the patient were reviewed by me and considered in my medical decision making (see chart for details).   L-Spine imaging obtained today due to intermittent back pain over the past 5 years.  X-ray is within normal limits.  No sign of bony abnormality.  Discussed results with patient.  Advised her that I suspect she may be having a muscle cramp or spasm or inflammation around her sciatic nerve.  Advised supportive care.  Prescribed Medrol since the over-the-counter NSAIDs have not helped over the past 10 days.  Also sent cyclobenzaprine.  Advised her to follow-up with Ortho as they may consider MRI at this point.  ED and return precautions discussed with patient.   Final Clinical Impressions(s) / UC Diagnoses   Final diagnoses:  Acute bilateral low back pain with left-sided sciatica     Discharge Instructions     BACK PAIN: Stressed avoiding painful activities . RICE (REST, ICE, COMPRESSION, ELEVATION) guidelines reviewed. May alternate ice and heat. Consider use of muscle rubs, Salonpas patches, etc. Use medications as directed including muscle  relaxers if prescribed. Take anti-inflammatory medications as prescribed or OTC NSAIDs/Tylenol.  F/u with PCP in 7-10 days for reexamination, and please feel free to call or return to the urgent care at any time for any questions or concerns you may have and we will be happy to help you!   BACK PAIN RED FLAGS: If the back pain acutely worsens or there are any red flag symptoms such as numbness/tingling, leg weakness, saddle anesthesia, or loss of bowel/bladder control, go immediately to the ER. Follow up with [place] as scheduled or sooner if the pain does not begin to resolve or if it worsens before the follow up    You have a condition requiring you to follow up with Orthopedics so please call one of the following office for appointment:   [facility] [address] [place], [place] [address] Phone: [phone]  [facility] [address], [place], [place] [address] Phone: [phone]     ED Prescriptions    None     PDMP not reviewed this encounter.   Shirlee Latch, PA-C 05/05/20 1734

## 2020-05-05 NOTE — Discharge Instructions (Addendum)
BACK PAIN: Stressed avoiding painful activities . RICE (REST, ICE, COMPRESSION, ELEVATION) guidelines reviewed. May alternate ice and heat. Consider use of muscle rubs, Salonpas patches, etc. Use medications as directed including muscle relaxers if prescribed. Take anti-inflammatory medications as prescribed or OTC NSAIDs/Tylenol.  F/u with PCP in 7-10 days for reexamination, and please feel free to call or return to the urgent care at any time for any questions or concerns you may have and we will be happy to help you!   BACK PAIN RED FLAGS: If the back pain acutely worsens or there are any red flag symptoms such as numbness/tingling, leg weakness, saddle anesthesia, or loss of bowel/bladder control, go immediately to the ER. Follow up with us as scheduled or sooner if the pain does not begin to resolve or if it worsens before the follow up    You have a condition requiring you to follow up with Orthopedics so please call one of the following office for appointment:   [facility] [address], [place], [place] [address] Phone: [phone]  [facility] [address], [place], [place] [address] Phone: [phone]  

## 2020-05-05 NOTE — ED Triage Notes (Signed)
Pt c/o left lower back pain, "spasmy" in nature radiating to left hip/gluteus area; occasional right lower back pain for the past 10 days.   Pt states reports increase pain with exertion.  Has taken naproxen and ibuprofen w/o sustained relief.  Denies fever, dysuria symptoms, n/v/d.

## 2020-10-06 ENCOUNTER — Other Ambulatory Visit: Payer: Self-pay

## 2020-10-06 ENCOUNTER — Ambulatory Visit (INDEPENDENT_AMBULATORY_CARE_PROVIDER_SITE_OTHER): Payer: Self-pay | Admitting: Obstetrics

## 2020-10-06 ENCOUNTER — Encounter: Payer: Self-pay | Admitting: Obstetrics

## 2020-10-06 VITALS — BP 132/80 | Wt 199.0 lb

## 2020-10-06 DIAGNOSIS — Z30431 Encounter for routine checking of intrauterine contraceptive device: Secondary | ICD-10-CM

## 2020-10-06 DIAGNOSIS — F3281 Premenstrual dysphoric disorder: Secondary | ICD-10-CM | POA: Insufficient documentation

## 2020-10-06 MED ORDER — SERTRALINE HCL 50 MG PO TABS: 75.0000 mg | ORAL_TABLET | Freq: Every day | ORAL | 1 refills | Status: DC

## 2020-10-06 NOTE — Progress Notes (Signed)
IUD String Check  Subjctive: [name] presents for IUD string check.  She had a Mirena placed 6 months ago.  Since placement of her IUD she had no vaginal bleeding.  She denies cramping or discomfort.  She has had intercourse since placement.  She has not checked the strings.  She denies any fever, chills, nausea, vomiting, or other complaints.  Recently , her husband has c/o something "sticking" him during intercourse. She wants me to view the strings.  As an aside, Sara Henson brings up her use of Sertraline, that she started after her last delivery. She has done well on the zoloft, but shares that a week before her period, her mood changes drastically. She gets very angry and irritable and does not feel like herself. These symptoms cease immediately, once her period starts. She is asking about addressing this medically.  Objective: BP 132/80   Wt 199 lb (90.3 kg)   BMI 31.17 kg/m  Physical Exam Constitutional:      Appearance: She is obese.  Genitourinary:     Vulva normal.     Genitourinary Comments: Shaves entire mons area. No rashes or lesions. Normal vaginal walls and rugae. Uterus is anteverted, mobile and non enlarged. Both IUD strings are noted extending 2-3 cms from the os.  HENT:     Head: Normocephalic and atraumatic.  Cardiovascular:     Rate and Rhythm: Normal rate and regular rhythm.     Pulses: Normal pulses.     Heart sounds: Normal heart sounds.  Pulmonary:     Effort: Pulmonary effort is normal.     Breath sounds: Normal breath sounds.  Abdominal:     Palpations: Abdomen is soft.  Musculoskeletal:        General: Normal range of motion.     Cervical back: Normal range of motion and neck supple.  Neurological:     General: No focal deficit present.     Mental Status: She is alert and oriented to person, place, and time.  Skin:    General: Skin is warm and dry.  Psychiatric:        Mood and Affect: Mood normal.        Behavior: Behavior normal.    PHQ  score is 7 GAD score 4. Female chaperone was present for the entirety of the pelvic exam  Assessment: 20 y.o. year old female status post prior Mirena IUD placement 6 months ago, doing well.  Plan: 1.  The patient was given instructions to check her IUD strings monthly and call with any problems or concerns.  She should call for fevers, chills, abnormal vaginal discharge, pelvic pain, or other complaints. 2.  She will return for a annual exam in 1 year.  All questions answered.  20 minutes spent in face to face discussion with > 50% spent in counseling, management, and coordination of care for her newly-placed IUD.  Risks and benefits of IUD discussed including the risks of irregular bleeding, cramping, infection, malpositioning, expulsion, which may require further procedures such as laparoscopy.  IUDs while effective at preventing pregnancy do not prevent transmission of sexually transmitted diseases and use of barrier methods for this purpose was discussed.  Low overall incidence of failure with 99.7% efficacy rate in typical use.    We did discuss her PMDD symptoms- and I have suggested we look at either increasing her zoloft dosing to 75 mg daily from the 50 mg tabs, or consider a transition to Fluoxetine.  She  would like to try increasing her Sertraline dosing to 75 mg, and then follow up with me in several months , when she is due for her annual physical.  I have reordered her prescription under the new dosing.  Mirna Mires, CNM  10/06/2020 3:56 PM   10/06/2020 3:51 PM

## 2020-11-13 ENCOUNTER — Other Ambulatory Visit: Payer: Self-pay

## 2020-11-13 ENCOUNTER — Encounter: Payer: Self-pay | Admitting: Emergency Medicine

## 2020-11-13 ENCOUNTER — Emergency Department
Admission: EM | Admit: 2020-11-13 | Discharge: 2020-11-13 | Disposition: A | Discharge status: Home / Self Care | Payer: Medicaid Other | Attending: Emergency Medicine | Admitting: Emergency Medicine

## 2020-11-13 DIAGNOSIS — E039 Hypothyroidism, unspecified: Secondary | ICD-10-CM | POA: Insufficient documentation

## 2020-11-13 DIAGNOSIS — R109 Unspecified abdominal pain: Secondary | ICD-10-CM | POA: Diagnosis present

## 2020-11-13 DIAGNOSIS — Z87891 Personal history of nicotine dependence: Secondary | ICD-10-CM | POA: Insufficient documentation

## 2020-11-13 DIAGNOSIS — J45909 Unspecified asthma, uncomplicated: Secondary | ICD-10-CM | POA: Diagnosis not present

## 2020-11-13 DIAGNOSIS — K2100 Gastro-esophageal reflux disease with esophagitis, without bleeding: Secondary | ICD-10-CM | POA: Diagnosis not present

## 2020-11-13 MED ORDER — SUCRALFATE 1 G PO TABS
1.0000 g | ORAL_TABLET | Freq: Three times a day (TID) | ORAL | 0 refills | Status: DC
Start: 2020-11-13 — End: 2021-08-14

## 2020-11-13 MED ORDER — OMEPRAZOLE MAGNESIUM 20 MG PO TBEC
20.0000 mg | DELAYED_RELEASE_TABLET | Freq: Every day | ORAL | 0 refills | Status: DC
Start: 2020-11-13 — End: 2021-08-14

## 2020-11-13 MED ORDER — FAMOTIDINE 20 MG PO TABS
20.0000 mg | ORAL_TABLET | Freq: Every day | ORAL | 0 refills | Status: DC
Start: 2020-11-13 — End: 2021-08-14

## 2020-11-13 MED ORDER — LIDOCAINE VISCOUS HCL 2 % MT SOLN
15.0000 mL | Freq: Once | OROMUCOSAL | Status: AC
  Administered 2020-11-13: 15 mL via ORAL
  Filled 2020-11-13: qty 15

## 2020-11-13 MED ORDER — FAMOTIDINE 20 MG PO TABS
20.0000 mg | ORAL_TABLET | Freq: Once | ORAL | Status: AC
  Administered 2020-11-13: 20 mg via ORAL
  Filled 2020-11-13: qty 1

## 2020-11-13 MED ORDER — ALUM & MAG HYDROXIDE-SIMETH 200-200-20 MG/5ML PO SUSP
30.0000 mL | Freq: Once | ORAL | Status: AC
  Administered 2020-11-13: 30 mL via ORAL
  Filled 2020-11-13: qty 30

## 2020-11-13 NOTE — ED Provider Notes (Signed)
[facility] Emergency Department Provider Note  ____________________________________________   Event Date/Time   First MD Initiated Contact with Patient 11/13/20 [phone]     (approximate)  I have reviewed the triage vital signs and the nursing notes.   HISTORY  Chief Complaint Gastroesophageal Reflux    HPI Sara Henson is a 20 y.o. female  with history as below here with burning, epigastric and throat discomfort. Pt reports that she has a h/o GERD, which occurs randomly and in 24-48 hours episodes without apparent triggers. She states that earlier tonight, she experienced worsening, burning, aching epigastric discomfort radiating up toward her throat. Pain is worse w/ swallowing/eating. No alleviating factors. No nausea, vomiting. No change in bowel habits. Reports she has been under significant recent stress recently. No other complaints. No CP, SOB. No h/o heart disease. No LE swelling, cough, pleurisy. No RUQ pain. No vomiting. No melena or hematochezia.        Past Medical History:  Diagnosis Date   Asthma    Elevated blood pressure affecting pregnancy in third trimester, antepartum 08/14/2019   GERD (gastroesophageal reflux disease)     Patient Active Problem List   Diagnosis Date Noted   PMDD (premenstrual dysphoric disorder) 10/06/2020   IUD (intrauterine device) in place 04/02/2020   Mastitis, obstetric, postpartum condition 12/27/2019   Gestational hypertension, third trimester    Thyroid nodule 08/22/2019   Subclinical hypothyroidism 08/14/2019   GERD without esophagitis 04/30/2016   Heartburn 04/30/2016   Loss of weight 01/22/2013   Disordered eating 01/22/2013   Bruising 11/13/2012   Chest pain 02/08/2012   Murmur 02/08/2012   Tachycardia 02/08/2012    Past Surgical History:  Procedure Laterality Date   CESAREAN SECTION N/A 09/28/2019   Procedure: CESAREAN SECTION;  Surgeon: Vena Austria, MD;  Location: [facility] ORS;  Service:  Obstetrics;  Laterality: N/A;  RJJ:[id number] apgar-8,9 weight 9lbs 11oz   TONSILLECTOMY     TYMPANOSTOMY TUBE PLACEMENT      Prior to Admission medications   Medication Sig Start Date End Date Taking? Authorizing Provider  famotidine (PEPCID) 20 MG tablet Take 1 tablet (20 mg total) by mouth daily for 7 days. 11/13/20 11/20/20 Yes Shaune Pollack, MD  omeprazole (PRILOSEC OTC) 20 MG tablet Take 1 tablet (20 mg total) by mouth daily for 14 days. 11/13/20 11/27/20 Yes Shaune Pollack, MD  sucralfate (CARAFATE) 1 g tablet Take 1 tablet (1 g total) by mouth 4 (four) times daily -  with meals and at bedtime for 7 days. 11/13/20 11/20/20 Yes Shaune Pollack, MD  b complex vitamins capsule Take 1 capsule by mouth daily.    [provider]  levonorgestrel (MIRENA) 20 MCG/24HR IUD 1 each by Intrauterine route once. 03/05/20 03/06/27  Tresea Mall, CNM  Prenatal Vit-Fe Fumarate-FA (PRENATAL MULTIVITAMIN) TABS tablet Take 1 tablet by mouth daily at 12 noon.    [provider]  sertraline (ZOLOFT) 50 MG tablet Take 1.5 tablets (75 mg total) by mouth daily. 10/06/20   Mirna Mires, CNM    Allergies Patient has no known allergies.  Family History  Problem Relation Age of Onset   Skin cancer Father     Social History Social History   Tobacco Use   Smoking status: Never   Smokeless tobacco: Former    Types: Associate Professor Use: Former   Devices: quit 2020  Substance Use Topics   Alcohol use: Not Currently   Drug use: No  Review of Systems  Review of Systems  Constitutional:  Positive for fatigue. Negative for chills and fever.  HENT:  Negative for sore throat.   Respiratory:  Negative for shortness of breath.   Cardiovascular:  Positive for chest pain.  Gastrointestinal:  Positive for abdominal pain.  Genitourinary:  Negative for flank pain.  Musculoskeletal:  Negative for neck pain.  Skin:  Negative for rash and wound.  Allergic/Immunologic:  Negative for immunocompromised state.  Neurological:  Negative for weakness and numbness.  Hematological:  Does not bruise/bleed easily.  All other systems reviewed and are negative.   ____________________________________________  PHYSICAL EXAM:      VITAL SIGNS: ED Triage Vitals  Enc Vitals Group     BP 11/13/20 0201 (!) 143/89     Pulse Rate 11/13/20 0201 90     Resp 11/13/20 0201 20     Temp 11/13/20 0201 99.1 F (37.3 C)     Temp Source 11/13/20 0201 Oral     SpO2 11/13/20 0201 98 %     Weight 11/13/20 0157 200 lb (90.7 kg)     Height 11/13/20 0157 5\' 7"  (1.702 m)     Head Circumference --      Peak Flow --      Pain Score 11/13/20 0157 5     Pain Loc --      Pain Edu? --      Excl. in GC? --      Physical Exam Vitals and nursing note reviewed.  Constitutional:      General: She is not in acute distress.    Appearance: She is well-developed.  HENT:     Head: Normocephalic and atraumatic.  Eyes:     Conjunctiva/sclera: Conjunctivae normal.  Cardiovascular:     Rate and Rhythm: Normal rate and regular rhythm.     Heart sounds: Normal heart sounds.  Pulmonary:     Effort: Pulmonary effort is normal. No respiratory distress.     Breath sounds: No wheezing.  Abdominal:     General: There is no distension.     Comments: Minimal epigastric TTP. No RUQ TTP, specifically no Murphy's sign. No other TTP, rebound, or guarding.  Musculoskeletal:     Cervical back: Neck supple.  Skin:    General: Skin is warm.     Capillary Refill: Capillary refill takes less than 2 seconds.     Findings: No rash.  Neurological:     Mental Status: She is alert and oriented to person, place, and time.     Motor: No abnormal muscle tone.      ____________________________________________   LABS (all labs ordered are listed, but only abnormal results are displayed)  Labs Reviewed - No data to display  ____________________________________________  EKG: Normal sinus rhythm, VR 71.  PR 212, QRS 101, QTc 434. No acute St elevations or depressions. No ischemia or infarct. ________________________________________  RADIOLOGY All imaging, including plain films, CT scans, and ultrasounds, independently reviewed by me, and interpretations confirmed via formal radiology reads.  ED MD interpretation:     Official radiology report(s): No results found.  ____________________________________________  PROCEDURES   Procedure(s) performed (including Critical Care):  Procedures  ____________________________________________  INITIAL IMPRESSION / MDM / ASSESSMENT AND PLAN / ED COURSE  As part of my medical decision making, I reviewed the following data within the electronic MEDICAL RECORD NUMBER Nursing notes reviewed and incorporated, Old chart reviewed, Notes from prior ED visits, and [place] Controlled Substance Database       *  NIOKA THORINGTON was evaluated in Emergency Department on 11/13/2020 for the symptoms described in the history of present illness. She was evaluated in the context of the global COVID-19 pandemic, which necessitated consideration that the patient might be at risk for infection with the SARS-CoV-2 virus that causes COVID-19. Institutional protocols and algorithms that pertain to the evaluation of patients at risk for COVID-19 are in a state of rapid change based on information released by regulatory bodies including the CDC and federal and state organizations. These policies and algorithms were followed during the patient's care in the ED.  Some ED evaluations and interventions may be delayed as a result of limited staffing during the pandemic.*     Medical Decision Making:  Well appearing, 20 yo F here with burning epigastric discomfort radiating to her throat. Suspect GERD/gastritis, with esophagitis. No signs to suggest significant bleeding. Pt is HDS. EKG Nonischemic. Lungs CTAB with normal WOB, no cough or SOB, no signs to suggest pulm source. Abdomen is soft  without focal findings to suggest cholecystitis, pancreatitis, or other intra-abd emergency. Pt feels markedly improved after GI cocktail. Will start on antacids, refer to GI as an outpatient if sx do not improve. Return precautions given.  ____________________________________________  FINAL CLINICAL IMPRESSION(S) / ED DIAGNOSES  Final diagnoses:  Gastroesophageal reflux disease with esophagitis without hemorrhage     MEDICATIONS GIVEN DURING THIS VISIT:  Medications  alum & mag hydroxide-simeth (MAALOX/MYLANTA) 200-200-20 MG/5ML suspension 30 mL (30 mLs Oral Given 11/13/20 0231)    And  lidocaine (XYLOCAINE) 2 % viscous mouth solution 15 mL (15 mLs Oral Given 11/13/20 0231)  famotidine (PEPCID) tablet 20 mg (20 mg Oral Given 11/13/20 0231)     ED Discharge Orders          Ordered    omeprazole (PRILOSEC OTC) 20 MG tablet  Daily        11/13/20 0235    famotidine (PEPCID) 20 MG tablet  Daily        11/13/20 0235    sucralfate (CARAFATE) 1 g tablet  3 times daily with meals & bedtime        11/13/20 0256             Note:  This document was prepared using Dragon voice recognition software and may include unintentional dictation errors.   Shaune Pollack, MD 11/13/20 [phone]

## 2020-11-13 NOTE — ED Notes (Signed)
Pt A&OX4, ambulatory at d/c with independent steady gait, NAD. Pt reports she feels better. Pt verbalized understanding of d/c instructions and follow up care.

## 2020-11-13 NOTE — Discharge Instructions (Signed)
For your reflux:  I've prescribed two different medications, but these can be purchased over-the-counter for cheaper prices, and work just as effectively  Take ANY of the following, once daily, 30 minutes before eating in the morning: - Omeprazole - Lansoprazole - Esomeprazole  For the next week, I'd recommend buying over-the-counter Pepcid (generic: Famotidine) and taking this twice daily as well, for heartburn.  Take tums as needed

## 2020-11-13 NOTE — ED Triage Notes (Signed)
Patient ambulatory to triage with steady gait, without difficulty or distress noted; pt reports c/o acid indigestion and esphogeal pain from "stomach acid"; dx with GERD but takes no meds

## 2020-11-18 ENCOUNTER — Ambulatory Visit: Payer: Self-pay | Admitting: Obstetrics

## 2020-11-25 ENCOUNTER — Ambulatory Visit: Payer: Self-pay | Admitting: Obstetrics

## 2020-11-25 DIAGNOSIS — F339 Major depressive disorder, recurrent, unspecified: Secondary | ICD-10-CM | POA: Insufficient documentation

## 2020-12-26 DIAGNOSIS — Z8659 Personal history of other mental and behavioral disorders: Secondary | ICD-10-CM | POA: Insufficient documentation

## 2021-02-06 ENCOUNTER — Ambulatory Visit: Payer: Self-pay | Admitting: Advanced Practice Midwife

## 2021-02-27 NOTE — Telephone Encounter (Signed)
Mirena rcvd/charged 03/05/2020

## 2021-03-03 ENCOUNTER — Other Ambulatory Visit: Payer: Self-pay | Admitting: Physician Assistant

## 2021-03-03 DIAGNOSIS — E041 Nontoxic single thyroid nodule: Secondary | ICD-10-CM

## 2021-03-06 ENCOUNTER — Ambulatory Visit
Admission: RE | Admit: 2021-03-06 | Discharge: 2021-03-06 | Disposition: A | Discharge status: Home / Self Care | Payer: Medicaid Other | Source: Ambulatory Visit | Attending: Physician Assistant | Admitting: Physician Assistant

## 2021-03-06 ENCOUNTER — Other Ambulatory Visit: Payer: Self-pay

## 2021-03-06 DIAGNOSIS — E041 Nontoxic single thyroid nodule: Secondary | ICD-10-CM | POA: Diagnosis present

## 2021-04-12 IMAGING — US US THYROID
1 series · 13 of 25 positions shown · non-contrast
Comparison: 08/20/2019

CLINICAL DATA: Thyroid nodule follow-up

EXAM:
THYROID ULTRASOUND
TECHNIQUE: Ultrasound examination of the thyroid gland and adjacent soft
tissues was performed.

[Series 1: us thyroid · 0.07mm/px · 49 acquisitions, 13 frames shown]
[im 1/49]
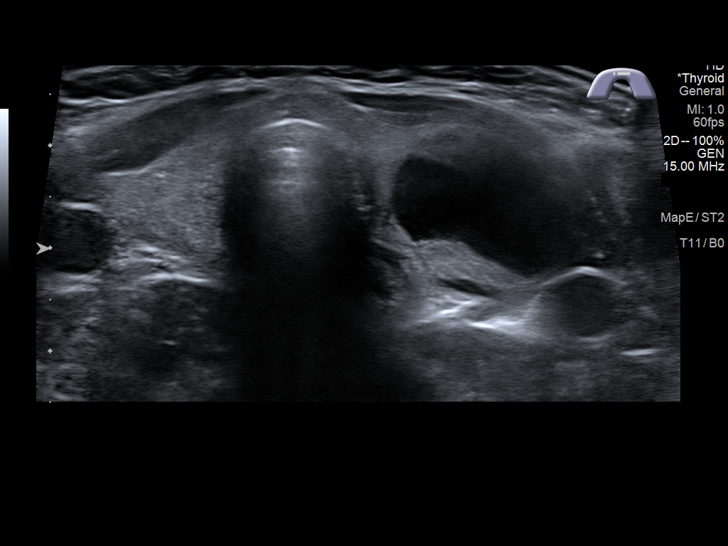
[im 5/49]
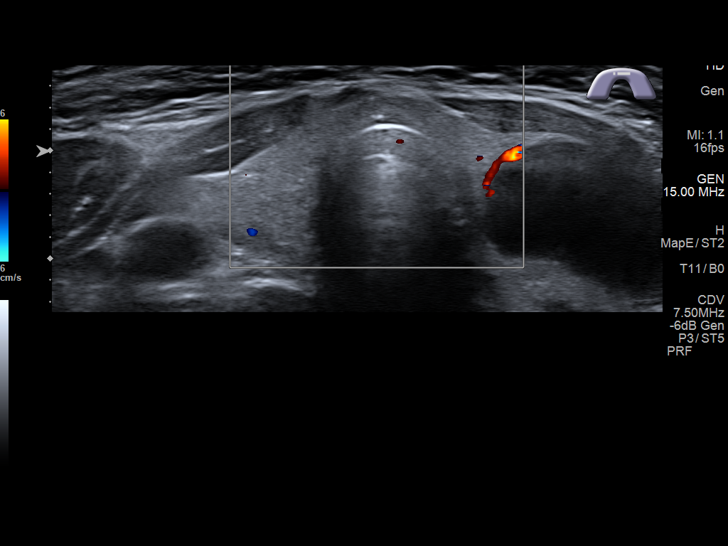
[im 9/49]
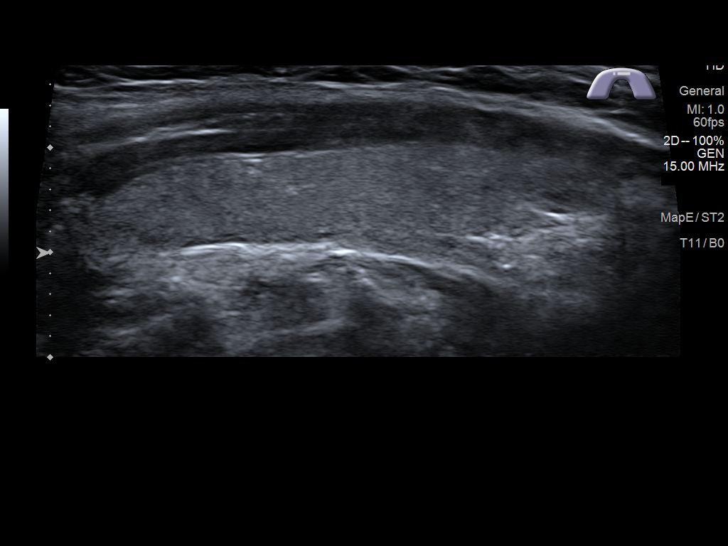
[im 13/49]
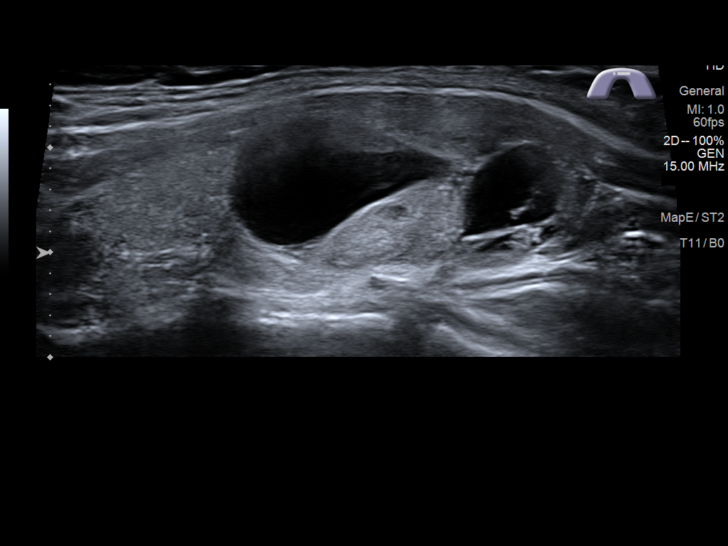
[im 17/49]
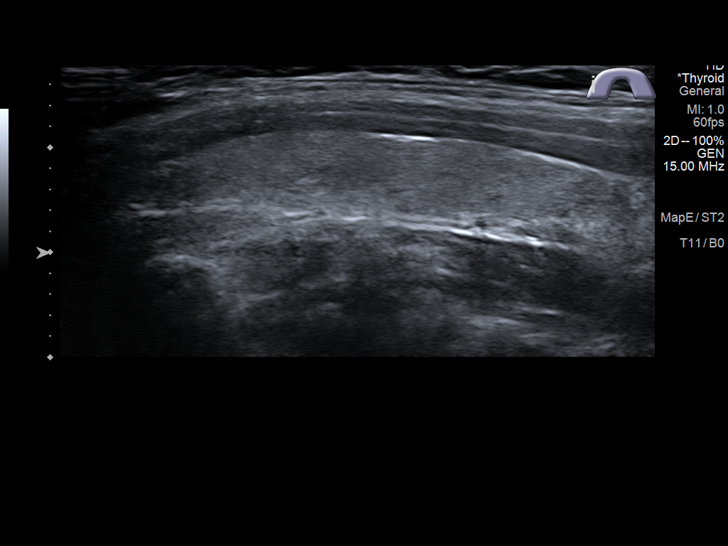
[im 21/49]
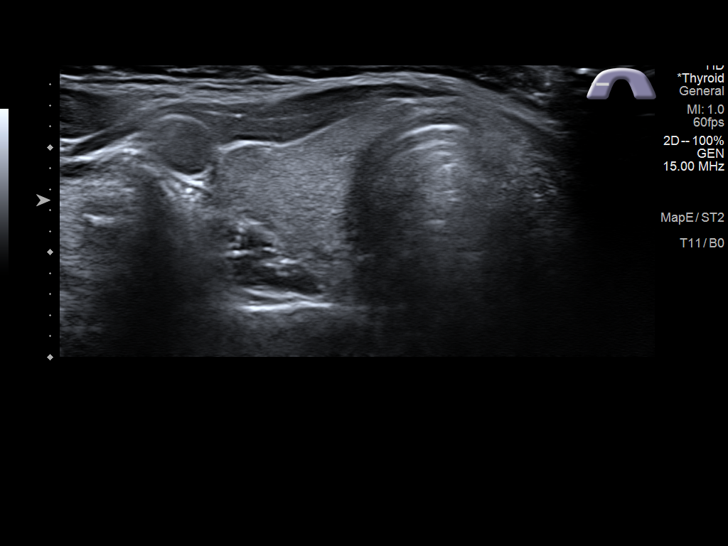
[im 25/49]
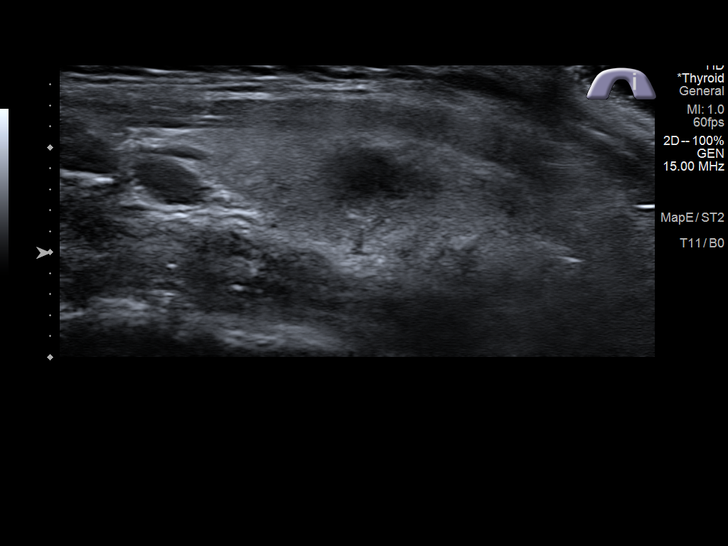
[im 29/49]
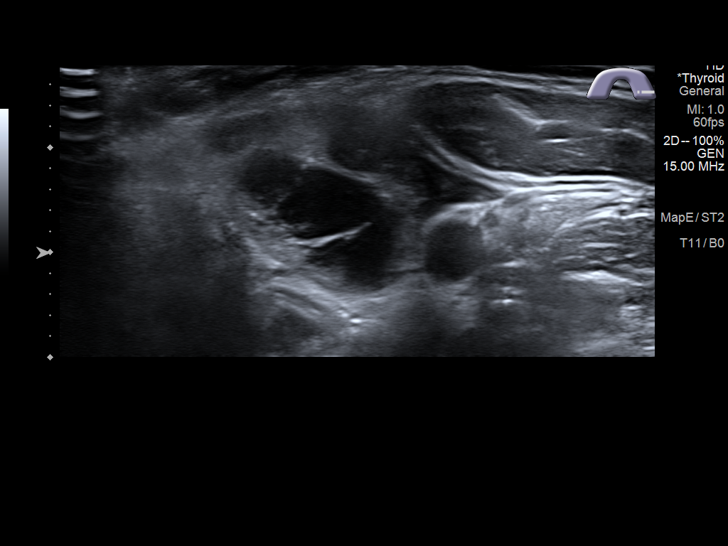
[im 33/49]
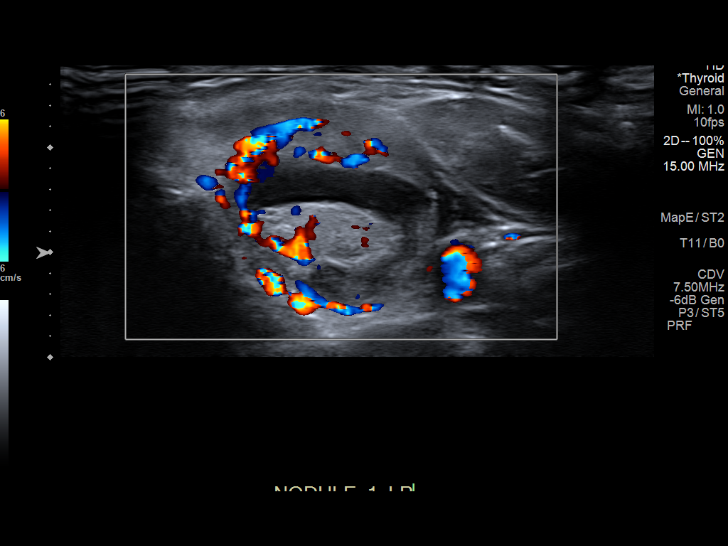
[im 37/49]
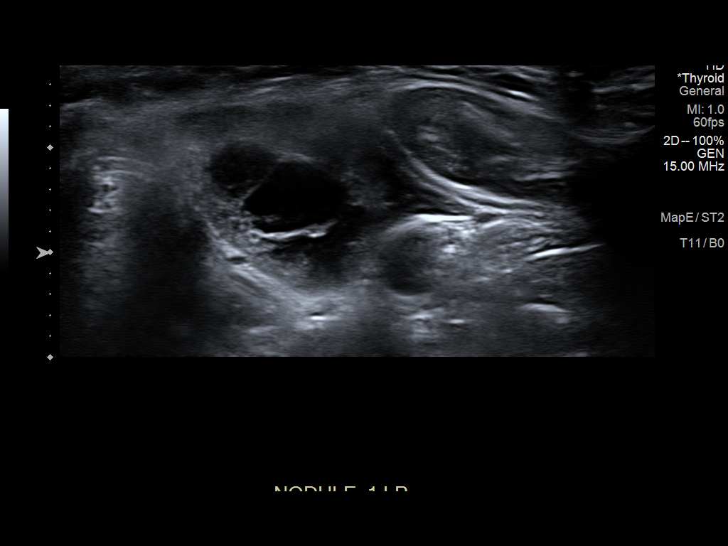
[im 41/49]
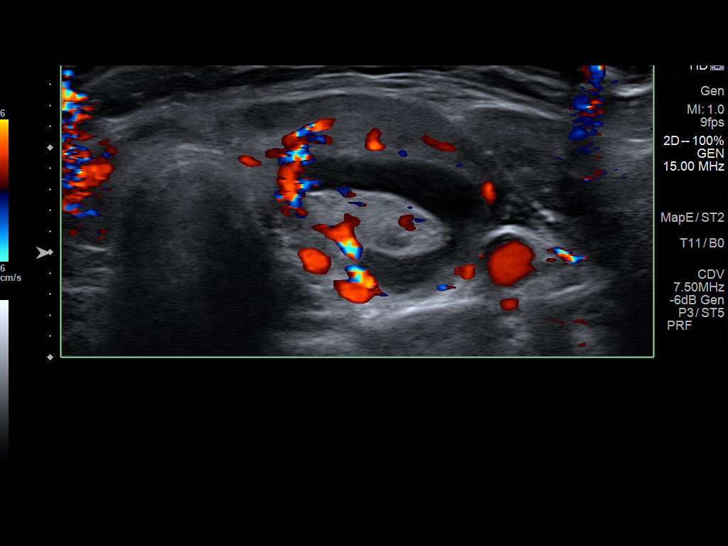
[im 45/49]
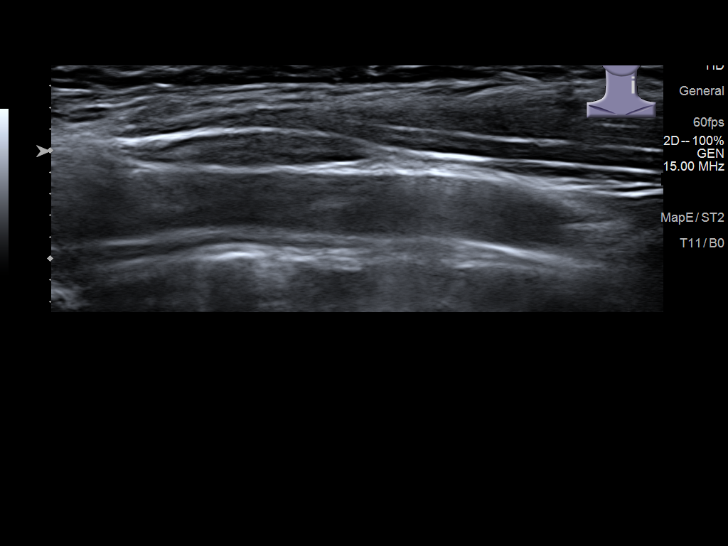
[im 49/49]
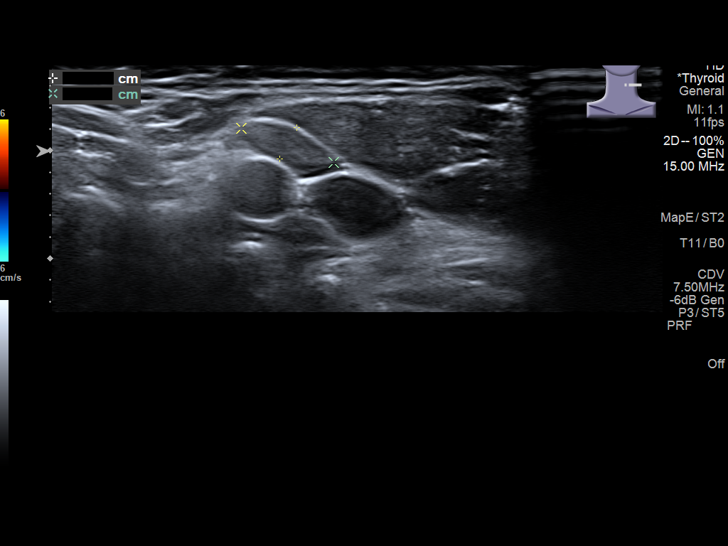

[13 of 25 positions shown; findings below may reference images not displayed]

FINDINGS: Parenchymal Echotexture: Normal

Isthmus: 0.3 cm

Right lobe: 5 x 1 x 1.5 cm

Left lobe: 4.9 x 1.7 x 2.2 cm

_________________________________________________________

Estimated total number of nodules >/= 1 cm: 1

Number of spongiform nodules >/=  2 cm not described below (TR1): 0

Number of mixed cystic and solid nodules >/= 1.5 cm not described
below (TR2): 0

_________________________________________________________

Nodule # 1:

Prior biopsy: No

Location: Left; Mid

Maximum size: 3.2 cm; Other 2 dimensions: 1.8 x 2.2 cm, previously,
3.2 x 1.6 x 1.4 cm

Composition: mixed cystic and solid (1)

Echogenicity: isoechoic (1)

Shape: not taller-than-wide (0)

Margins: smooth (0)

Echogenic foci: none (0)

ACR TI-RADS total points: 2.

ACR TI-RADS risk category:  TR2 (2 points).

Significant change in size (>/= 20% in two dimensions and minimal
increase of 2 mm): No

Change in features: No

Change in ACR TI-RADS risk category: No

ACR TI-RADS recommendations:

This nodule does NOT meet TI-RADS criteria for biopsy or dedicated
follow-up.

_________________________________________________________
IMPRESSION: Stable mixed cystic and solid TR2 thyroid nodule in the left thyroid
gland. No further follow-up is recommended.

The above is in keeping with the ACR TI-RADS recommendations - [HOSPITAL] 8158;[DATE].

## 2021-05-03 NOTE — L&D Delivery Note (Addendum)
       Delivery Note   Sara Henson is a 21 y.o. G3P1011 at [redacted]w[redacted]d Estimated Date of Delivery: 12/29/21  PRE-OPERATIVE DIAGNOSIS:  1) [redacted]w[redacted]d pregnancy.    POST-OPERATIVE DIAGNOSIS:  1) [redacted]w[redacted]d pregnancy s/p Vaginal, Spontaneous , Vaginal Birth After Cesarean Section   Delivery Type: Vaginal, Spontaneous    Delivery Anesthesia: Epidural   Labor Complications:  none    ESTIMATED BLOOD LOSS: 810 ml    FINDINGS:   1) female infant, Apgar scores of 4   at 1 minute and 5   at 5 minutes and a birthweight 9lbs 2oz.    2) Nuchal cord: No  SPECIMENS:   PLACENTA:   Appearance: Intact , 3 vessel cord,    Removal: Spontaneous      Disposition:  per protocol   DISPOSITION:  Infant to left in stable condition in the delivery room, with L&D personnel and mother,  NARRATIVE SUMMARY: Labor course:  [name] is a [id number] at [redacted]w[redacted]d who presented for induction of labor.  She progressed well in labor with pitocin.  She received the appropriate anesthesia and proceeded to complete dilation. She evidenced good maternal expulsive effort during the second stage. She went on to deliver a viable female infant. The placenta delivered without problems and was noted to be complete. A perineal and vaginal examination was performed. Episiotomy/Lacerations: 2nd degree  vaginal , bilateral labial Lacerations were repaired with 3-0 Vicryl Rapide suture using local anesthesia. The patient tolerated this well. TXA and pitocin given postpartum.   Doreene Burke, CNM  12/23/2021 11:01 AM

## 2021-05-18 ENCOUNTER — Other Ambulatory Visit (HOSPITAL_COMMUNITY)
Admission: RE | Admit: 2021-05-18 | Discharge: 2021-05-18 | Disposition: A | Discharge status: Home / Self Care | Payer: Medicaid Other | Source: Ambulatory Visit | Attending: Obstetrics | Admitting: Obstetrics

## 2021-05-18 ENCOUNTER — Ambulatory Visit (INDEPENDENT_AMBULATORY_CARE_PROVIDER_SITE_OTHER): Payer: Medicaid Other | Admitting: Obstetrics

## 2021-05-18 ENCOUNTER — Other Ambulatory Visit: Payer: Self-pay

## 2021-05-18 ENCOUNTER — Encounter: Payer: Self-pay | Admitting: Obstetrics

## 2021-05-18 VITALS — BP 122/74 | Wt 180.0 lb

## 2021-05-18 DIAGNOSIS — Z124 Encounter for screening for malignant neoplasm of cervix: Secondary | ICD-10-CM | POA: Diagnosis not present

## 2021-05-18 DIAGNOSIS — Z3480 Encounter for supervision of other normal pregnancy, unspecified trimester: Secondary | ICD-10-CM | POA: Insufficient documentation

## 2021-05-18 DIAGNOSIS — Z3A01 Less than 8 weeks gestation of pregnancy: Secondary | ICD-10-CM

## 2021-05-18 DIAGNOSIS — N926 Irregular menstruation, unspecified: Secondary | ICD-10-CM | POA: Diagnosis not present

## 2021-05-18 LAB — POCT URINE PREGNANCY: Preg Test, Ur: POSITIVE — AB

## 2021-05-18 NOTE — Progress Notes (Signed)
New Obstetric Patient H&P    Chief Complaint: "Desires prenatal care"   History of Present Illness: Patient is a 21 y.o. G3P1011 Not Hispanic or Latino female, LMP 03/25/2021 presents with amenorrhea and positive home pregnancy test. Based on her  LMP, her EDD is Estimated Date of Delivery: 12/29/21 and her EGA is 8572w6d. Cycles are 6. days, regular, and occur approximately every : 28 days. Her last pap smear was about 1 years ago and was atypical squamous cellularity of undetermined significance (ASCUS). She ahd an IUD in place, but her husband pulled it out for her (they could not afford the clinic cost for IUD removal)  She had a urine pregnancy test which was positive about 3 week(s)  ago. Her last menstrual period was normal and lasted for  about 6 day(s). Since her LMP she claims she has experienced fatigue and some nausea.. She denies vaginal bleeding. 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 Her prior pregnancies are notable for mental illness, and a primary Cesarean section.  Since her LMP, she admits to the use of tobacco products  no She claims she has gained   4 pounds since the start of her pregnancy.  There are cats in the home in the home  no She admits close contact with children on a regular basis  yes  She has had chicken pox in the past unknown She has had Tuberculosis exposures, symptoms, or previously tested positive for TB   yes Current or past history of domestic violence. no  Genetic Screening/Teratology Counseling: (Includes patient, baby's father, or anyone in either family with:)   1. [facility]= 12[facility]  no 2. Thalassemia ([place], [place], Mediterranean, or Asian background): MCV<80  no 3. Neural tube defect (meningomyelocele, spina bifida, anencephaly)  no 4. Congenital heart defect  no  5. Down syndrome   no 6. Tay-Sachs (Jewish, [place])  no 7. Canavan's Disease  no 8. Sickle cell disease or trait (African)  no  9. Hemophilia or other blood disorders  no  10. Muscular dystrophy  no  11. Cystic fibrosis  no  12. Huntington's Chorea  no  13. Mental retardation/autism  no 14. Other inherited genetic or chromosomal disorder  no 15. Maternal metabolic disorder (DM, PKU, etc)  no 16. Patient or FOB with a child with a birth defect not listed above no  16a. Patient or FOB with a birth defect themselves no 17. Recurrent pregnancy loss, or stillbirth  no  18. Any medications since LMP other than prenatal vitamins (include vitamins, supplements, OTC meds, drugs, alcohol)  Yes- she is on daily Prozac 60 mg for depression. 19. Any other genetic/environmental exposure to discuss  no  Infection History:   1. Lives with someone with TB or TB exposed  no  2. Patient or partner has history of genital herpes  no 3. Rash or viral illness since LMP  no 4. History of STI (GC, CT, HPV, syphilis, HIV)  no 5. History of recent travel :  no  Other pertinent information:  no     Review of Systems:10 point review of systems negative unless otherwise noted in HPI  Past Medical History:  Past Medical History:  Diagnosis Date   Asthma    Elevated blood pressure affecting pregnancy in third trimester, antepartum 08/14/2019   GERD (  gastroesophageal reflux disease)     Past Surgical History:  Past Surgical History:  Procedure Laterality Date   CESAREAN SECTION N/A 09/28/2019   Procedure: CESAREAN SECTION;  Surgeon: Vena AustriaStaebler, Andreas, MD;  Location: [facility] ORS;  Service: Obstetrics;  Laterality: N/A;  ZOX:WRUEAVsex:female [id number] apgar-8,9 weight 9lbs 11oz   COLPOSCOPY     TONSILLECTOMY     TYMPANOSTOMY TUBE PLACEMENT      Gynecologic History: Patient's last menstrual period was 03/24/2021.  Obstetric History: G3P1011  Family History:  Family History  Problem Relation Age of Onset   Skin  cancer Father     Social History:  Social History   Socioeconomic History   Marital status: Married    Spouse name: Gardiner Barefootathaniel   Number of children: 0   Years of education: 13   Highest education level: High school graduate  Occupational History   Occupation: Unemployed  Tobacco Use   Smoking status: Never   Smokeless tobacco: Former    Types: Associate ProfessorChew  Vaping Use   Vaping Use: Former   Devices: quit 2020  Substance and Sexual Activity   Alcohol use: Not Currently   Drug use: No   Sexual activity: Yes  Other Topics Concern   Not on file  Social History Narrative   Not on file   Social Determinants of Health   Financial Resource Strain: Not on file  Food Insecurity: Not on file  Transportation Needs: Not on file  Physical Activity: Not on file  Stress: Not on file  Social Connections: Not on file  Intimate Partner Violence: Not on file    Allergies:  No Known Allergies  Medications: Prior to Admission medications   Medication Sig Start Date End Date Taking? Authorizing Provider  FLUoxetine (PROZAC) 40 MG capsule Take 40 mg by mouth daily.   Yes [provider]  Prenatal Vit-Fe Fumarate-FA (PRENATAL MULTIVITAMIN) TABS tablet Take 1 tablet by mouth daily at 12 noon.   Yes [provider]  b complex vitamins capsule Take 1 capsule by mouth daily. Patient not taking: Reported on 05/18/2021    [provider]  famotidine (PEPCID) 20 MG tablet Take 1 tablet (20 mg total) by mouth daily for 7 days. 11/13/20 11/20/20  Shaune PollackIsaacs, Cameron, MD  levonorgestrel (MIRENA) 20 MCG/24HR IUD 1 each by Intrauterine route once. Patient not taking: Reported on 05/18/2021 03/05/20 03/06/27  Tresea MallGledhill, Jane, CNM  omeprazole (PRILOSEC OTC) 20 MG tablet Take 1 tablet (20 mg total) by mouth daily for 14 days. 11/13/20 11/27/20  Shaune PollackIsaacs, Cameron, MD  sertraline (ZOLOFT) 50 MG tablet Take 1.5 tablets (75 mg total) by mouth daily. Patient not taking: Reported on  05/18/2021 10/06/20   Mirna MiresFryer, Carmelia Tiner M, CNM  sucralfate (CARAFATE) 1 g tablet Take 1 tablet (1 g total) by mouth 4 (four) times daily -  with meals and at bedtime for 7 days. 11/13/20 11/20/20  Shaune PollackIsaacs, Cameron, MD    Physical Exam Vitals: Blood pressure 122/74, weight 180 lb (81.6 kg), last menstrual period 03/24/2021, not currently breastfeeding.  General: NAD HEENT: normocephalic, anicteric Thyroid: no enlargement, no palpable nodules Pulmonary: No increased work of breathing, CTAB Cardiovascular: RRR, distal pulses 2+ Abdomen: NABS, soft, non-tender, non-distended.  Umbilicus without lesions.  No hepatomegaly, splenomegaly or masses palpable. No evidence of hernia  Genitourinary:  External: Normal external female genitalia.  Normal urethral meatus, normal  Bartholin's and Skene's glands.    Vagina: Normal vaginal mucosa, no evidence of prolapse.    Cervix: Grossly normal in appearance, no  bleeding  Uterus: anteverted Non-enlarged, mobile, normal contour.  No CMT  Adnexa: ovaries non-enlarged, no adnexal masses  Rectal: deferred Extremities: no edema, erythema, or tenderness Neurologic: Grossly intact Psychiatric: mood appropriate, affect full   Assessment: 21 y.o. G3P1011 at [redacted]w[redacted]d presenting to initiate prenatal care  Plan: 1) Avoid alcoholic beverages. 2) Patient encouraged not to smoke.  3) Discontinue the use of all non-medicinal drugs and chemicals.  4) Take prenatal vitamins daily.  5) Nutrition, food safety (fish, cheese advisories, and high nitrite foods) and exercise discussed. 6) Hospital and practice style discussed with cross coverage system.  7) Genetic Screening, such as with 1st Trimester Screening, cell free fetal DNA, AFP testing, and Ultrasound, as well as with amniocentesis and CVS as appropriate, is discussed with patient. At the conclusion of today's visit patient requested genetic testing 8) Patient is asked about travel to areas at risk for the Zika virus, and  counseled to avoid travel and exposure to mosquitoes or sexual partners who may have themselves been exposed to the virus. Testing is discussed, and will be ordered as appropriate.  The following were addressed during this visit:  Breastfeeding Education - Early initiation of breastfeeding    Comments: Keeps milk supply adequate, helps contract uterus and slow bleeding, and early milk is the perfect first food and is easy to digest.   - The importance of exclusive breastfeeding    Comments: Provides antibodies, Lower risk of breast and ovarian cancers, and type-2 diabetes,Helps your body recover, Reduced chance of SIDS.   - Risks of giving your baby anything other than breast milk if you are breastfeeding    Comments: Make the baby less content with breastfeeds, may make my baby more susceptible to illness, and may reduce my milk supply.   - Nonpharmacological pain relief methods for labor    Comments: Deep breathing, focusing on pleasant things, movement and walking, heating pads or cold compress, massage and relaxation, continuous support from someone you trust, and Doulas   - The importance of early skin-to-skin contact    Comments: Keeps baby warm and secure, helps keep babys blood sugar up and breathing steady, easier to bond and breastfeed, and helps calm baby.  - Rooming-in on a 24-hour basis    Comments: Easier to learn baby's feeding cues, easier to bond and get to know each other, and encourages milk production.   - Feeding on demand or baby-led feeding    Comments: Helps prevent breastfeeding complications, helps bring in good milk supply, prevents under or overfeeding, and helps baby feel content and satisfied   - Frequent feeding to help assure optimal milk production    Comments: Making a full supply of milk requires frequent removal of milk from breasts, infant will eat 8-12 times in 24 hours, if separated from infant use breast massage, hand expression and/ or pumping  to remove milk from breasts.   - Effective positioning and attachment    Comments: Helps my baby to get enough breast milk, helps to produce an adequate milk supply, and helps prevent nipple pain and damage   - Exclusive breastfeeding for the first 6 months    Comments: Builds a healthy milk supply and keeps it up, protects baby from sickness and disease, and breastmilk has everything your baby needs for the first 6 months.  - Individualized Education    Comments: Contraindications to breastfeeding and other special medical conditions   Her bloodwork and pap, cultures done today. We discussed her keeping her  wt gain to a 25 lb limit and I outlined some dietary guidelines to assist with this.

## 2021-05-18 NOTE — Progress Notes (Signed)
NOB today. LMP 03/24/2021. Pt was at [facility] for depression.

## 2021-05-19 LAB — CBC/D/PLT+RPR+RH+ABO+RUBIGG...
Antibody Screen: NEGATIVE
Basophils Absolute: 0 10*3/uL (ref 0.0–0.2)
Basos: 0 %
EOS (ABSOLUTE): 0.1 10*3/uL (ref 0.0–0.4)
Eos: 2 %
HCV Ab: 0.1 s/co ratio (ref 0.0–0.9)
HIV Screen 4th Generation wRfx: NONREACTIVE
Hematocrit: 38.7 % (ref 34.0–46.6)
Hemoglobin: 13.3 g/dL (ref 11.1–15.9)
Hepatitis B Surface Ag: NEGATIVE
Immature Grans (Abs): 0 10*3/uL (ref 0.0–0.1)
Immature Granulocytes: 0 %
Lymphocytes Absolute: 1.9 10*3/uL (ref 0.7–3.1)
Lymphs: 26 %
MCH: 30 pg (ref 26.6–33.0)
MCHC: 34.4 g/dL (ref 31.5–35.7)
MCV: 87 fL (ref 79–97)
Monocytes Absolute: 0.6 10*3/uL (ref 0.1–0.9)
Monocytes: 8 %
Neutrophils Absolute: 4.7 10*3/uL (ref 1.4–7.0)
Neutrophils: 64 %
Platelets: 265 10*3/uL (ref 150–450)
RBC: 4.43 x10E6/uL (ref 3.77–5.28)
RDW: 12 % (ref 11.7–15.4)
RPR Ser Ql: NONREACTIVE
Rh Factor: POSITIVE
Rubella Antibodies, IGG: 5.41 index (ref >0.99)
Varicella zoster IgG: 1646 index (ref >165)
WBC: 7.3 10*3/uL (ref 3.4–10.8)

## 2021-05-19 LAB — HCV INTERPRETATION

## 2021-05-20 LAB — CERVICOVAGINAL ANCILLARY ONLY
Bacterial Vaginitis (gardnerella): NEGATIVE
Candida Glabrata: NEGATIVE
Candida Vaginitis: NEGATIVE
Chlamydia: NEGATIVE
Comment: NEGATIVE
Comment: NEGATIVE
Comment: NEGATIVE
Comment: NEGATIVE
Comment: NEGATIVE
Comment: NORMAL
Neisseria Gonorrhea: NEGATIVE
Trichomonas: NEGATIVE

## 2021-05-20 LAB — CYTOLOGY - PAP: Diagnosis: NEGATIVE

## 2021-06-15 ENCOUNTER — Encounter: Payer: Medicaid Other | Admitting: Licensed Practical Nurse

## 2021-06-17 ENCOUNTER — Other Ambulatory Visit: Payer: Self-pay

## 2021-06-17 ENCOUNTER — Ambulatory Visit
Admission: RE | Admit: 2021-06-17 | Discharge: 2021-06-17 | Disposition: A | Discharge status: Home / Self Care | Payer: Medicaid Other | Source: Ambulatory Visit | Attending: Obstetrics | Admitting: Obstetrics

## 2021-06-17 DIAGNOSIS — Z3A12 12 weeks gestation of pregnancy: Secondary | ICD-10-CM | POA: Insufficient documentation

## 2021-06-17 DIAGNOSIS — Z3687 Encounter for antenatal screening for uncertain dates: Secondary | ICD-10-CM | POA: Diagnosis not present

## 2021-06-17 DIAGNOSIS — Z3480 Encounter for supervision of other normal pregnancy, unspecified trimester: Secondary | ICD-10-CM

## 2021-06-19 ENCOUNTER — Encounter: Payer: Self-pay | Admitting: Licensed Practical Nurse

## 2021-06-19 ENCOUNTER — Ambulatory Visit (INDEPENDENT_AMBULATORY_CARE_PROVIDER_SITE_OTHER): Payer: Medicaid Other | Admitting: Licensed Practical Nurse

## 2021-06-19 ENCOUNTER — Other Ambulatory Visit: Payer: Self-pay

## 2021-06-19 VITALS — BP 118/60 | Wt 181.0 lb

## 2021-06-19 DIAGNOSIS — Z1379 Encounter for other screening for genetic and chromosomal anomalies: Secondary | ICD-10-CM

## 2021-06-19 DIAGNOSIS — Z3A12 12 weeks gestation of pregnancy: Secondary | ICD-10-CM

## 2021-06-19 DIAGNOSIS — Z3480 Encounter for supervision of other normal pregnancy, unspecified trimester: Secondary | ICD-10-CM

## 2021-06-19 LAB — POCT URINALYSIS DIPSTICK OB
Glucose, UA: NEGATIVE
POC,PROTEIN,UA: NEGATIVE

## 2021-06-19 NOTE — Progress Notes (Signed)
Routine Prenatal Care Visit  Subjective  Sara Henson is a 21 y.o. G3P1011 at [redacted]w[redacted]d being seen today for ongoing prenatal care.  She is currently monitored for the following issues for this high-risk pregnancy and has Loss of weight; Disordered eating; Subclinical hypothyroidism; Thyroid nodule; Bruising; Chest pain; GERD without esophagitis; Heartburn; Murmur; Tachycardia; Gestational hypertension, third trimester; Mastitis, obstetric, postpartum condition; IUD (intrauterine device) in place; PMDD (premenstrual dysphoric disorder); and Supervision of other normal pregnancy, antepartum on their problem list.  ----------------------------------------------------------------------------------- Patient reports fatigue.  Reports nausea is improving. Has a plan with [facility] perinatal pysch and nutrition, feels confident that she can manage mood and nutrition this pregnancy.  Gained 80lbs last pregnancy (infant was 9lbs 11oz).  Desires VBAC.    . Vag. Bleeding: None.  Movement: Absent. Leaking Fluid denies.  ----------------------------------------------------------------------------------- The following portions of the patient's history were reviewed and updated as appropriate: allergies, current medications, past family history, past medical history, past social history, past surgical history and problem list. Problem list updated.  Objective  Blood pressure 118/60, weight 181 lb (82.1 kg), last menstrual period 03/24/2021, not currently breastfeeding. Pregravid weight 180 lb (81.6 kg) Total Weight Gain 1 lb (0.454 kg) Urinalysis: Urine Protein Negative  Urine Glucose Negative  Fetal Status: Fetal Heart Rate (bpm): 155   Movement: Absent     General:  Alert, oriented and cooperative. Patient is in no acute distress.  Skin: Skin is warm and dry. No rash noted.   Cardiovascular: Normal heart rate noted  Respiratory: Normal respiratory effort, no problems with respiration noted  Abdomen: Soft, gravid,  appropriate for gestational age. Pain/Pressure: Absent     Pelvic:  Cervical exam deferred        Extremities: Normal range of motion.     Mental Status: Normal mood and affect. Normal behavior. Normal judgment and thought content.   Assessment   21 y.o. G3P1011 at [redacted]w[redacted]d by  12/29/2021, by Last Menstrual Period presenting for routine prenatal visit  Plan   pregnancy Problems (from 05/18/21 to present)     Problem Noted Resolved   Supervision of other normal pregnancy, antepartum 05/18/2021 by Mirna Mires, CNM No   Overview Addendum 05/25/2021  3:03 PM by Mirna Mires, CNM     Nursing Staff Provider  Office Location  [facility] Dating    Language  English Anatomy US    Flu Vaccine   Genetic Screen  NIPS:   TDaP vaccine    Hgb A1C or  GTT Early : Third trimester :   Covid    LAB RESULTS   Rhogam   Blood Type     Feeding Plan  Antibody    Contraception  Rubella    Circumcision  RPR     Pediatrician   HBsAg     Support Person  HIV    Prenatal Classes  Varicella     GBS  (For PCN allergy, check sensitivities)   BTL Consent     VBAC Consent  Pap  NILM    Hgb Electro      CF      SMA         Hx of primary Cs in 2021. >9lb baby) weight gain of 80 lbs. Desires VBAC.           general obstetric precautions including but not limited to vaginal bleeding, contractions, leaking of fluid and fetal movement were reviewed in detail with the patient. Please refer to After Visit Summary for other  counseling recommendations.   Return in about 4 weeks (around 07/17/2021) for ROB.  Anatomy US ordered  Genetic screening collected  Needs to see OB for VBAC consent   Carie Caddy, CNM  Domingo Pulse, [facility]  06/19/21  3:54 PM

## 2021-06-25 LAB — MATERNIT 21 PLUS CORE, BLOOD
Fetal Fraction: 6
Result (T21): NEGATIVE
Trisomy 13 (Patau syndrome): NEGATIVE
Trisomy 18 (Edwards syndrome): NEGATIVE
Trisomy 21 (Down syndrome): NEGATIVE

## 2021-07-07 ENCOUNTER — Other Ambulatory Visit: Payer: Self-pay | Admitting: Licensed Practical Nurse

## 2021-07-07 ENCOUNTER — Telehealth: Payer: Self-pay

## 2021-07-07 DIAGNOSIS — Z3480 Encounter for supervision of other normal pregnancy, unspecified trimester: Secondary | ICD-10-CM

## 2021-07-07 NOTE — Telephone Encounter (Signed)
Patient is calliung due to no receiving a call from Centralized scheduling for her Anatomy scan. She was advise there was no order placed. Could you please place order so patient can call to scheduled. Thank you!

## 2021-07-07 NOTE — Telephone Encounter (Signed)
Spoke with patient via phone. Patient is aware order placed and to contact centralized scheduling.

## 2021-07-17 ENCOUNTER — Other Ambulatory Visit: Payer: Self-pay

## 2021-07-17 ENCOUNTER — Encounter: Payer: Self-pay | Admitting: Licensed Practical Nurse

## 2021-07-17 ENCOUNTER — Ambulatory Visit (INDEPENDENT_AMBULATORY_CARE_PROVIDER_SITE_OTHER): Payer: Medicaid Other | Admitting: Licensed Practical Nurse

## 2021-07-17 VITALS — BP 120/60 | Wt 186.0 lb

## 2021-07-17 DIAGNOSIS — Z3A16 16 weeks gestation of pregnancy: Secondary | ICD-10-CM

## 2021-07-17 DIAGNOSIS — Z3480 Encounter for supervision of other normal pregnancy, unspecified trimester: Secondary | ICD-10-CM

## 2021-07-17 LAB — POCT URINALYSIS DIPSTICK OB
Glucose, UA: NEGATIVE
POC,PROTEIN,UA: NEGATIVE

## 2021-07-17 NOTE — Progress Notes (Signed)
Routine Prenatal Care Visit ? ?Subjective  ?Sara Henson is a 21 y.o. G3P1011 at [redacted]w[redacted]d being seen today for ongoing prenatal care.  She is currently monitored for the following issues for this high-risk pregnancy and has Loss of weight; Disordered eating; Subclinical hypothyroidism; Thyroid nodule; Bruising; Chest pain; GERD without esophagitis; Heartburn; Murmur; Tachycardia; Gestational hypertension, third trimester; Mastitis, obstetric, postpartum condition; IUD (intrauterine device) in place; PMDD (premenstrual dysphoric disorder); and Supervision of other normal pregnancy, antepartum on their problem list.  ?----------------------------------------------------------------------------------- ?Patient reports no complaints.  Here with daughter, Nausea has improved. Mood is good.  ?Contractions: Not present. Vag. Bleeding: None.  Movement: Present. Leaking Fluid denies.  ?----------------------------------------------------------------------------------- ?The following portions of the patient's history were reviewed and updated as appropriate: allergies, current medications, past family history, past medical history, past social history, past surgical history and problem list. Problem list updated. ? ?Objective  ?Blood pressure 120/60, weight 186 lb (84.4 kg), last menstrual period 03/24/2021, not currently breastfeeding. ?Pregravid weight 180 lb (81.6 kg) Total Weight Gain 6 lb (2.722 kg) ?Urinalysis: Urine Protein Negative  Urine Glucose Negative ? ?Fetal Status: Fetal Heart Rate (bpm): 150   Movement: Present    ? ?General:  Alert, oriented and cooperative. Patient is in no acute distress.  ?Skin: Skin is warm and dry. No rash noted.   ?Cardiovascular: Normal heart rate noted  ?Respiratory: Normal respiratory effort, no problems with respiration noted  ?Abdomen: Soft, gravid, appropriate for gestational age. Pain/Pressure: Absent     ?Pelvic:  Cervical exam deferred        ?Extremities: Normal range of motion.      ?Mental Status: Normal mood and affect. Normal behavior. Normal judgment and thought content.  ? ?Assessment  ? ?21 y.o. G3P1011 at [redacted]w[redacted]d by  12/29/2021, by Last Menstrual Period presenting for routine prenatal visit ? ?Plan  ? ?pregnancy Problems (from 05/18/21 to present)   ? ? Problem Noted Resolved  ? Supervision of other normal pregnancy, antepartum 05/18/2021 by Imagene Riches, CNM No  ? Overview Addendum 06/19/2021  3:57 PM by Allen Derry, CNM  ?   ?Nursing Staff Provider  ?Office Location  [facility] Dating  [redacted]w[redacted]d 06/17/21  ?Language  English Anatomy US    ?Flu Vaccine   Genetic Screen  NIPS:   ?TDaP vaccine    Hgb A1C or  ?GTT Early : ?Third trimester :   ?Covid    LAB RESULTS   ?Rhogam   Blood Type   A Pos  ?Feeding Plan  Antibody  neg  ?Contraception  Rubella  immune  ?Circumcision  RPR   NR  ?Pediatrician   HBsAg   neg  ?Support Person  HIV  NR  ?Prenatal Classes  Varicella Immune  ?  GBS  (For PCN allergy, check sensitivities)   ?BTL Consent     ?VBAC Consent  Pap  NILM  ?  Hgb Electro    ?  CF   ?   SMA   ?     ? ?Hx of primary Cs in 2021. >9lb baby) weight gain of 80 lbs. Desires VBAC. ?  ?  ? ?  ?  ? general obstetric precautions including but not limited to vaginal bleeding, contractions, leaking of fluid and fetal movement were reviewed in detail with the patient. ?Please refer to After Visit Summary for other counseling recommendations.  ? ?Return in about 4 weeks (around 08/14/2021) for [place]. ? ?Has anatomy US in April ? ?Roberto Scales, CNM  ?  Mosetta Pigeon, [place]  ?07/17/21  ?4:16 PM  ? ? ?

## 2021-08-07 ENCOUNTER — Ambulatory Visit
Admission: RE | Admit: 2021-08-07 | Discharge: 2021-08-07 | Disposition: A | Discharge status: Home / Self Care | Payer: Medicaid Other | Source: Ambulatory Visit | Attending: Licensed Practical Nurse | Admitting: Licensed Practical Nurse

## 2021-08-07 DIAGNOSIS — Z3689 Encounter for other specified antenatal screening: Secondary | ICD-10-CM | POA: Insufficient documentation

## 2021-08-07 DIAGNOSIS — Z3A19 19 weeks gestation of pregnancy: Secondary | ICD-10-CM | POA: Insufficient documentation

## 2021-08-07 DIAGNOSIS — Z3480 Encounter for supervision of other normal pregnancy, unspecified trimester: Secondary | ICD-10-CM

## 2021-08-14 ENCOUNTER — Encounter: Payer: Self-pay | Admitting: Licensed Practical Nurse

## 2021-08-14 ENCOUNTER — Ambulatory Visit (INDEPENDENT_AMBULATORY_CARE_PROVIDER_SITE_OTHER): Payer: Medicaid Other | Admitting: Licensed Practical Nurse

## 2021-08-14 VITALS — BP 96/60 | Wt 193.0 lb

## 2021-08-14 DIAGNOSIS — Z3480 Encounter for supervision of other normal pregnancy, unspecified trimester: Secondary | ICD-10-CM

## 2021-08-14 DIAGNOSIS — Z3A20 20 weeks gestation of pregnancy: Secondary | ICD-10-CM

## 2021-08-14 NOTE — Progress Notes (Signed)
Routine Prenatal Care Visit ? ?Subjective  ?Sara Henson is a 21 y.o. G3P1011 at [redacted]w[redacted]d being seen today for ongoing prenatal care.  She is currently monitored for the following issues for this high-risk pregnancy and has Loss of weight; Disordered eating; Subclinical hypothyroidism; Thyroid nodule; Bruising; Chest pain; GERD without esophagitis; Heartburn; Murmur; Tachycardia; PMDD (premenstrual dysphoric disorder); and Supervision of other normal pregnancy, antepartum on their problem list.  ?----------------------------------------------------------------------------------- ?Patient reports no complaints.  Doing well.  Very active with small child at home. Mood has been good. Reviewed weight gain.  ? .  .   . Leaking Fluid denies.  ?----------------------------------------------------------------------------------- ?The following portions of the patient's history were reviewed and updated as appropriate: allergies, current medications, past family history, past medical history, past social history, past surgical history and problem list. Problem list updated. ? ?Objective  ?Blood pressure 96/60, weight 193 lb (87.5 kg), last menstrual period 03/24/2021, not currently breastfeeding. ?Pregravid weight 180 lb (81.6 kg) Total Weight Gain 13 lb (5.897 kg) ?Urinalysis: Urine Protein    Urine Glucose   ? ?Fetal Status:          ? ?General:  Alert, oriented and cooperative. Patient is in no acute distress.  ?Skin: Skin is warm and dry. No rash noted.   ?Cardiovascular: Normal heart rate noted  ?Respiratory: Normal respiratory effort, no problems with respiration noted  ?Abdomen: Soft, gravid, appropriate for gestational age.       ?Pelvic:  Cervical exam deferred        ?Extremities: Normal range of motion.     ?Mental Status: Normal mood and affect. Normal behavior. Normal judgment and thought content.  ? ?Assessment  ? ?21 y.o. G3P1011 at [redacted]w[redacted]d by  12/29/2021, by Last Menstrual Period presenting for routine prenatal  visit ? ?Plan  ? ?pregnancy Problems (from 05/18/21 to present)   ? ? Problem Noted Resolved  ? Supervision of other normal pregnancy, antepartum 05/18/2021 by Mirna Mires, CNM No  ? Overview Addendum 08/14/2021  3:24 PM by Ellwood Sayers, CNM  ?   ?Nursing Staff Provider  ?Office Location  [facility] Dating  [redacted]w[redacted]d 06/17/21  ?Language  Ambulance person [place]  Norm, needs fu   ?Flu Vaccine   Genetic Screen  NIPS:   ?TDaP vaccine    Hgb A1C or  ?GTT Early : ?Third trimester :   ?Covid    LAB RESULTS   ?Rhogam  NA Blood Type   A Pos  ?Feeding Plan Breast Antibody  neg  ?Contraception  Rubella  immune  ?Circumcision Yes RPR   NR  ?Pediatrician  [facility] HBsAg   neg  ?Support Person  HIV  NR  ?Prenatal Classes multip  Varicella Immune  ?  GBS  (For PCN allergy, check sensitivities)   ?BTL Consent     ?VBAC Consent  Pap  NILM  ?  Hgb Electro    ?  CF   ?   SMA   ?     ? ?Hx of primary Cs in 2021. >9lb baby) weight gain of 80 lbs. Desires VBAC. ?  ?  ? ?  ?  ? ?Preterm labor symptoms and general obstetric precautions including but not limited to vaginal bleeding, contractions, leaking of fluid and fetal movement were reviewed in detail with the patient. ?Please refer to After Visit Summary for other counseling recommendations.  ? ?Return in about 4 weeks (around 09/11/2021) for ROB. ? ?Fu anatomy US ordered  ? ?Carie Caddy, CNM  ?  Domingo Pulse, [facility]  ?08/14/21  ?3:28 PM  ? ? ?

## 2021-08-14 NOTE — Progress Notes (Signed)
ROB- no concerns 

## 2021-08-17 ENCOUNTER — Telehealth: Payer: Self-pay

## 2021-08-17 NOTE — Telephone Encounter (Signed)
Pt calling to see if its ok for her to take Viagra 50mg  to help with her sex drive? Her psychiatrist prescribed it but the pharmacy wasn't sure about it with pregnancy, please advise  ?

## 2021-08-18 NOTE — Telephone Encounter (Signed)
Patient is scheduled with [facility] 09/01/21 for medication follow up

## 2021-08-18 NOTE — Telephone Encounter (Signed)
Called pt someone answered and ended call called back no answer left voice message to call us back

## 2021-08-18 NOTE — Telephone Encounter (Signed)
Pt needs to schedule with dr Alvester Morin ?

## 2021-08-31 ENCOUNTER — Ambulatory Visit
Admission: RE | Admit: 2021-08-31 | Discharge: 2021-08-31 | Disposition: A | Discharge status: Home / Self Care | Payer: Medicaid Other | Source: Ambulatory Visit | Attending: Licensed Practical Nurse | Admitting: Licensed Practical Nurse

## 2021-08-31 DIAGNOSIS — Z3689 Encounter for other specified antenatal screening: Secondary | ICD-10-CM | POA: Diagnosis not present

## 2021-08-31 DIAGNOSIS — Z3A22 22 weeks gestation of pregnancy: Secondary | ICD-10-CM | POA: Insufficient documentation

## 2021-08-31 DIAGNOSIS — Z3480 Encounter for supervision of other normal pregnancy, unspecified trimester: Secondary | ICD-10-CM | POA: Insufficient documentation

## 2021-09-01 ENCOUNTER — Ambulatory Visit (INDEPENDENT_AMBULATORY_CARE_PROVIDER_SITE_OTHER): Payer: Medicaid Other | Admitting: Family Medicine

## 2021-09-01 VITALS — BP 120/80 | Wt 197.0 lb

## 2021-09-01 DIAGNOSIS — Z3480 Encounter for supervision of other normal pregnancy, unspecified trimester: Secondary | ICD-10-CM

## 2021-09-01 DIAGNOSIS — F53 Postpartum depression: Secondary | ICD-10-CM | POA: Diagnosis not present

## 2021-09-01 DIAGNOSIS — R6882 Decreased libido: Secondary | ICD-10-CM | POA: Diagnosis not present

## 2021-09-01 HISTORY — DX: Postpartum depression: F53.0

## 2021-09-01 NOTE — Progress Notes (Signed)
? ? ?Medication Consult VISIT NOTE ? ?Subjective:  ?Sara Henson is a 21 y.o. G3P1011 at [redacted]w[redacted]d being seen today for ongoing prenatal care.  She is currently monitored for the following issues for this high-risk pregnancy and has Disordered eating; Subclinical hypothyroidism; Thyroid nodule; GERD without esophagitis; Heartburn; Murmur; Tachycardia; PMDD (premenstrual dysphoric disorder); Supervision of other normal pregnancy, antepartum; and Postpartum depression on their problem list. ? ?Patient reports  low sex drive/lower libido and diminished arousal .  Contractions: Not present. Vag. Bleeding: None.  Movement: Present. Denies leaking of fluid.  ? ?The following portions of the patient's history were reviewed and updated as appropriate: allergies, current medications, past family history, past medical history, past social history, past surgical history and problem list.  ? ?Objective:  ? ?Vitals:  ? 09/01/21 0928  ?BP: 120/80  ?Weight: 197 lb (89.4 kg)  ? ? ?Fetal Status: Fetal Heart Rate (bpm): 150   Movement: Present    ? ?General:  Alert, oriented and cooperative. Patient is in no acute distress.  ?Skin: Skin is warm and dry. No rash noted.   ?Cardiovascular: Normal heart rate noted  ?Respiratory: Normal respiratory effort, no problems with respiration noted  ?Abdomen: Soft, gravid, appropriate for gestational age.  Pain/Pressure: Present     ?Pelvic: Cervical exam deferred        ?Extremities: Normal range of motion.     ?Mental Status: Normal mood and affect. Normal behavior. Normal judgment and thought content.  ? ?Assessment and Plan:  ?Pregnancy: G3P1011 at [redacted]w[redacted]d here for medication consult after being prescribed Viagra ? ?1. Decreased libido ?We had a lengthy discussion about use of Viagra ? ?Viagra was prescribed by Dr Jadene Pierini (Resident) under supervision of Tressia Miners (Attending) on 4/13 to help with sexual side effects of SSRI therapy in lieu of starting mirtazipine or wellburtrin givne her  history of an eating disorder.  ? ?Patient is seen a Perinatal Psychiatry at [facility] She is under their care and has been since inpatient admission for postpartum depression. She reports low libido and worries most about her partners feelings about not having sex. She reports she is just not in the mood often. 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 Additionally she is on prozac for PPD.  We reviewed the mental load of motherhood and the normal fatigue that comes with pregnancy as the patient reports she is "so tired" and worries about telling her partner "so many times" that she is not interested in sex.  I reviewed the her lower libido is likely multifactorial with situational/maternal work and mental load, antidepressant therapy and normal fatigue of pregnancy. She reports she feels she "should" want sex 4-5 times per week.  She also reports a history of family that was shaming towards sexuality and she feels this likely contributes her to her lack fo desire.  ? ?She has spoken with her partner but not extensively.  ? ?We discussed that Viagra is valid intervention and that is might lower her biological barriers to sex but will not address the larger situational issues that contribute to her low sex drive. I strongly encouraged open, honest and straightforward discussion with her partner and other ways to express love and intimacy.  ? ?Discussed that Viagra is not a standard or typical medication in pregnancy and that the Perinatal Psychiatrists at [facility] specialize in pregnancy. They are aware of safety profiles.  ? ?I discussed that ultimately I cannot  tell her anything is 100% safe or unsafe and I would recommend a dedicated discussion with her partner about why she feels she needs to be on this medication. ? ?I called the [facility] -Perinatal Psychiatry Help Line to assure I spoke with a pysch MD about the use of viagra in pregnancy.  ? ?2. Supervision of other  normal pregnancy, antepartum ?TWG=17 lb (7.711 kg) which is at goal. I checked FHT to assure pregnancy safety but ultimately this visit was focused exclusively on her medication review.  ? ? ?Face to face time:  40 minutes ? ?Greater than 50% of the visit time was spent in counseling and coordination of care with the patient.  The summary and outline of the counseling and care coordination is summarized in the note above. ? ? All questions were answered. ? ?Preterm labor symptoms and general obstetric precautions including but not limited to vaginal bleeding, contractions, leaking of fluid and fetal movement were reviewed in detail with the patient. ?Please refer to After Visit Summary for other counseling recommendations.  ? ? ?Future Appointments  ?Date Time Provider [place]  ?09/09/2021  3:55 PM Imagene Riches, CNM [facility] None  ? ? ?Caren Macadam, MD ?

## 2021-09-09 ENCOUNTER — Ambulatory Visit (INDEPENDENT_AMBULATORY_CARE_PROVIDER_SITE_OTHER): Payer: Medicaid Other | Admitting: Obstetrics

## 2021-09-09 VITALS — BP 118/74 | Wt 196.0 lb

## 2021-09-09 DIAGNOSIS — Z3480 Encounter for supervision of other normal pregnancy, unspecified trimester: Secondary | ICD-10-CM

## 2021-09-09 DIAGNOSIS — Z3A24 24 weeks gestation of pregnancy: Secondary | ICD-10-CM

## 2021-09-09 NOTE — Progress Notes (Signed)
Routine Prenatal Care Visit ? ?Subjective  ?Sara Henson is a 21 y.o. G3P1011 at [redacted]w[redacted]d being seen today for ongoing prenatal care.  She is currently monitored for the following issues for this high-risk pregnancy and has Disordered eating; Subclinical hypothyroidism; Thyroid nodule; GERD without esophagitis; Heartburn; Murmur; Tachycardia; PMDD (premenstrual dysphoric disorder); Supervision of other normal pregnancy, antepartum; and Postpartum depression on their problem list.  ?----------------------------------------------------------------------------------- ?Patient reports no bleeding, no contractions, no cramping and but has not felt well since yesterday. Concerned she may have a UTI. Urine dip done. Marland Kitchen   ?Contractions: Not present. Vag. Bleeding: None.   . Leaking Fluid denies.  ?----------------------------------------------------------------------------------- ?The following portions of the patient's history were reviewed and updated as appropriate: allergies, current medications, past family history, past medical history, past social history, past surgical history and problem list. Problem list updated. ? ?Objective  ?Blood pressure 118/74, weight 196 lb (88.9 kg), last menstrual period 03/24/2021, not currently breastfeeding. ?Pregravid weight 180 lb (81.6 kg) Total Weight Gain 16 lb (7.258 kg) ?Urinalysis: Urine Protein    Urine Glucose   ? ?Fetal Status:          ? ?General:  Alert, oriented and cooperative. Patient is in no acute distress.  ?Skin: Skin is warm and dry. No rash noted.   ?Cardiovascular: Normal heart rate noted  ?Respiratory: Normal respiratory effort, no problems with respiration noted  ?Abdomen: Soft, gravid, appropriate for gestational age. Pain/Pressure: Absent     ?Pelvic:  Cervical exam deferred        ?Extremities: Normal range of motion.     ?Mental Status: Normal mood and affect. Normal behavior. Normal judgment and thought content.  ? ?Assessment  ? ?21 y.o. G3P1011 at [redacted]w[redacted]d by   12/29/2021, by Last Menstrual Period presenting for routine prenatal visit ? ?Plan  ? ?pregnancy Problems (from 05/18/21 to present)   ? Problem Noted Resolved  ? Supervision of other normal pregnancy, antepartum 05/18/2021 by Imagene Riches, CNM No  ? Overview Addendum 08/14/2021  3:24 PM by Allen Derry, CNM  ?   ?Nursing Staff Provider  ?Office Location  [facility] Dating  [redacted]w[redacted]d 06/17/21  ?Language  Media planner [place]  Norm, needs fu   ?Flu Vaccine   Genetic Screen  NIPS:   ?TDaP vaccine    Hgb A1C or  ?GTT Early : ?Third trimester :   ?Covid    LAB RESULTS   ?Rhogam  NA Blood Type   A Pos  ?Feeding Plan Breast Antibody  neg  ?Contraception  Rubella  immune  ?Circumcision Yes RPR   NR  ?Pediatrician  [facility] HBsAg   neg  ?Support Person  HIV  NR  ?Prenatal Classes multip  Varicella Immune  ?  GBS  (For PCN allergy, check sensitivities)   ?BTL Consent     ?VBAC Consent  Pap  NILM  ?  Hgb Electro    ?  CF   ?   SMA   ?     ? ?Hx of primary Cs in 2021. >9lb baby) weight gain of 80 lbs. Desires VBAC. ?  ?  ?  ?  ? ?Preterm labor symptoms and general obstetric precautions including but not limited to vaginal bleeding, contractions, leaking of fluid and fetal movement were reviewed in detail with the patient. ?Please refer to After Visit Summary for other counseling recommendations.  ?We continued the discussion re her lower libido briefly today. She gained a good amount  of weight with  her last pregnancy, and is working to gain less. ? ?Return in about 4 weeks (around 10/07/2021) for return OB, 28 week labs. ? ?Imagene Riches, CNM  ?09/09/2021 4:28 PM   ? ?

## 2021-09-09 NOTE — Progress Notes (Signed)
No vb. No lof.  

## 2021-09-11 ENCOUNTER — Encounter: Payer: Medicaid Other | Admitting: Obstetrics

## 2021-10-07 ENCOUNTER — Encounter: Payer: Medicaid Other | Admitting: Licensed Practical Nurse

## 2021-10-07 ENCOUNTER — Encounter: Payer: Self-pay | Admitting: Obstetrics

## 2021-10-07 ENCOUNTER — Other Ambulatory Visit: Payer: Medicaid Other

## 2021-10-07 ENCOUNTER — Ambulatory Visit (INDEPENDENT_AMBULATORY_CARE_PROVIDER_SITE_OTHER): Payer: Medicaid Other | Admitting: Obstetrics

## 2021-10-07 VITALS — BP 122/70 | Wt 205.0 lb

## 2021-10-07 DIAGNOSIS — Z113 Encounter for screening for infections with a predominantly sexual mode of transmission: Secondary | ICD-10-CM

## 2021-10-07 DIAGNOSIS — O0990 Supervision of high risk pregnancy, unspecified, unspecified trimester: Secondary | ICD-10-CM

## 2021-10-07 DIAGNOSIS — R12 Heartburn: Secondary | ICD-10-CM

## 2021-10-07 DIAGNOSIS — O99891 Other specified diseases and conditions complicating pregnancy: Secondary | ICD-10-CM | POA: Insufficient documentation

## 2021-10-07 DIAGNOSIS — Z8659 Personal history of other mental and behavioral disorders: Secondary | ICD-10-CM | POA: Insufficient documentation

## 2021-10-07 DIAGNOSIS — O09299 Supervision of pregnancy with other poor reproductive or obstetric history, unspecified trimester: Secondary | ICD-10-CM | POA: Insufficient documentation

## 2021-10-07 DIAGNOSIS — Z131 Encounter for screening for diabetes mellitus: Secondary | ICD-10-CM

## 2021-10-07 DIAGNOSIS — R7989 Other specified abnormal findings of blood chemistry: Secondary | ICD-10-CM

## 2021-10-07 DIAGNOSIS — O26899 Other specified pregnancy related conditions, unspecified trimester: Secondary | ICD-10-CM

## 2021-10-07 DIAGNOSIS — Z3480 Encounter for supervision of other normal pregnancy, unspecified trimester: Secondary | ICD-10-CM

## 2021-10-07 DIAGNOSIS — Z3A28 28 weeks gestation of pregnancy: Secondary | ICD-10-CM

## 2021-10-07 DIAGNOSIS — Z23 Encounter for immunization: Secondary | ICD-10-CM

## 2021-10-07 DIAGNOSIS — O34219 Maternal care for unspecified type scar from previous cesarean delivery: Secondary | ICD-10-CM | POA: Insufficient documentation

## 2021-10-07 LAB — POCT URINALYSIS DIPSTICK
Glucose, UA: NEGATIVE
Protein, UA: NEGATIVE

## 2021-10-07 NOTE — Progress Notes (Addendum)
Subjective: Patient returns for pregnancy follow-up. She has no concerns.  She reports good fetal movement, denies vaginal bleeding or leakage of fluid. She denies contractions. She denies urinary symptoms. Has had some headaches to 4/10, tylenol helps. blurred vision or epigastric pain. 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  Objective: BP 122/70   Wt 205 lb (93 kg)   LMP 03/24/2021   BMI 32.11 kg/m  Physical Exam Vitals and nursing note reviewed.  Constitutional:      Appearance: Normal appearance.  HENT:     Head: Normocephalic and atraumatic.  Eyes:     Extraocular Movements: Extraocular movements intact.  Pulmonary:     Effort: Pulmonary effort is normal.  Abdominal:     Palpations: Mass: gravid FH 29.  Musculoskeletal:        General: Normal range of motion.     Cervical back: Normal range of motion.  Skin:    General: Skin is warm and dry.  Neurological:     General: No focal deficit present.     Mental Status: She is alert and oriented to person, place, and time.  Psychiatric:        Mood and Affect: Mood normal.        Behavior: Behavior normal.        Thought Content: Thought content normal.   Assessment and Plan:  Patient Sara Henson is an 21 y.o. year old G3P1011 at [redacted]w[redacted]d with [facility] Estimated Date of Delivery: 12/29/21 with Supervision of high risk pregnancy, antepartum - Plan: Glucose, 1 hour, RPR, HIV Antibody (routine testing w rflx), Antibody screen, CBC with Differential, TSH Rfx on Abnormal to Free T4, Urine Culture, Protein / creatinine ratio, urine, POCT Urinalysis Dipstick  [redacted] weeks gestation of pregnancy - Plan: 28 Week RH+Panel  Previous cesarean delivery, antepartum  History of postpartum depression, currently pregnant  History of eating disorder  Heartburn during pregnancy, antepartum  Low TSH level - Plan: TSH Rfx on Abnormal to Free T4  Routine screening for STI (sexually transmitted infection) - Plan: 28 Week RH+Panel  Screening  for diabetes mellitus (DM) - Plan: 28 Week RH+Panel  Desires VBAC (vaginal birth after cesarean) trial  History of macrosomia in infant in prior pregnancy, currently pregnant  Labs: glucola/labs today History of low TSH - recheck today  A pos VI RI Prir CS x 1 - arrest of dilation/CPD/macrosomia 9 lb 11 oz. - desires VBAC: 20 y.o. G3P1011 reports undergoing a c-section for cephalo-pelvic disproportion, failure to progress: arrest of descent, and macrosomia. Her infant's birthweight was 9 lb 11 oz. She does plan to have more children. VBAC Calculator: Maternal age 82 y.o. Pregravid BMI: 28.19 Any previous vaginal delivery? No.  Any vaginal delivery since last c-section? No.  Indication for prior c-section arrest of dilation or descent? Yes.    Predicted change of successful TOLAC? 62.1 % Recommend growth [place] by 36 weeks  History of PP depression s/p in patient treatment; also with history of eating disorder/binge. Currently on prozac 60. Mood stable. Follows with [facility] - notes in epic. TWG 25 lbs  (history of 80 lb weight gain last pregnancy).  9 lb weight gain since last visit - check PC ratio Genetics: s/p low risk CFDNA female MSAFP missed Its a boy desires circ Immunizations: did not receive flu, declined covid; desires tdap today  Plans breast declines contraception   Return in about 2 weeks (around 10/21/2021) for routine OB.

## 2021-10-07 NOTE — Patient Instructions (Signed)
Please call if you have elevated blood pressure, vision changes, headache, right-sided upper abdominal pain.  Please call if you have contractions occurring every 5-10 minutes for 1-2 hours, if you have vaginal bleeding, if watery fluid is leaking from your vagina, or if you have decreased fetal movement.  If you are not yet signed up on [facility] My[facility], please ask us how to sign up for it!  

## 2021-10-07 NOTE — Addendum Note (Signed)
Addended by: Liliane Shi on: 10/07/2021 09:47 AM   Modules accepted: Orders

## 2021-10-07 NOTE — Progress Notes (Signed)
28 week labs today. No vb. No lof.  

## 2021-10-08 LAB — 28 WEEK RH+PANEL
Basophils Absolute: 0 10*3/uL (ref 0.0–0.2)
Basos: 0 %
EOS (ABSOLUTE): 0.1 10*3/uL (ref 0.0–0.4)
Eos: 2 %
Gestational Diabetes Screen: 126 mg/dL (ref 70–139)
HIV Screen 4th Generation wRfx: NONREACTIVE
Hematocrit: 37.2 % (ref 34.0–46.6)
Hemoglobin: 12.3 g/dL (ref 11.1–15.9)
Immature Grans (Abs): 0 10*3/uL (ref 0.0–0.1)
Immature Granulocytes: 1 %
Lymphocytes Absolute: 1.3 10*3/uL (ref 0.7–3.1)
Lymphs: 17 %
MCH: 29 pg (ref 26.6–33.0)
MCHC: 33.1 g/dL (ref 31.5–35.7)
MCV: 88 fL (ref 79–97)
Monocytes Absolute: 0.6 10*3/uL (ref 0.1–0.9)
Monocytes: 8 %
Neutrophils Absolute: 5.3 10*3/uL (ref 1.4–7.0)
Neutrophils: 72 %
Platelets: 199 10*3/uL (ref 150–450)
RBC: 4.24 x10E6/uL (ref 3.77–5.28)
RDW: 12.6 % (ref 11.7–15.4)
RPR Ser Ql: NONREACTIVE
WBC: 7.3 10*3/uL (ref 3.4–10.8)

## 2021-10-08 LAB — PROTEIN / CREATININE RATIO, URINE
Creatinine, Urine: 39.8 mg/dL
Protein, Ur: 9.1 mg/dL
Protein/Creat Ratio: 229 mg/g creat — ABNORMAL HIGH (ref 0–200)

## 2021-10-09 LAB — URINE CULTURE

## 2021-10-23 ENCOUNTER — Ambulatory Visit (INDEPENDENT_AMBULATORY_CARE_PROVIDER_SITE_OTHER): Payer: Medicaid Other | Admitting: Obstetrics

## 2021-10-23 VITALS — BP 126/80 | Wt 208.0 lb

## 2021-10-23 DIAGNOSIS — Z8659 Personal history of other mental and behavioral disorders: Secondary | ICD-10-CM

## 2021-10-23 DIAGNOSIS — Z3A30 30 weeks gestation of pregnancy: Secondary | ICD-10-CM

## 2021-10-23 DIAGNOSIS — O0990 Supervision of high risk pregnancy, unspecified, unspecified trimester: Secondary | ICD-10-CM

## 2021-10-23 DIAGNOSIS — R399 Unspecified symptoms and signs involving the genitourinary system: Secondary | ICD-10-CM

## 2021-10-23 DIAGNOSIS — E0590 Thyrotoxicosis, unspecified without thyrotoxic crisis or storm: Secondary | ICD-10-CM

## 2021-10-23 DIAGNOSIS — O99891 Other specified diseases and conditions complicating pregnancy: Secondary | ICD-10-CM

## 2021-10-23 DIAGNOSIS — O34219 Maternal care for unspecified type scar from previous cesarean delivery: Secondary | ICD-10-CM

## 2021-10-23 DIAGNOSIS — R12 Heartburn: Secondary | ICD-10-CM

## 2021-10-23 DIAGNOSIS — R7989 Other specified abnormal findings of blood chemistry: Secondary | ICD-10-CM

## 2021-10-23 DIAGNOSIS — O26899 Other specified pregnancy related conditions, unspecified trimester: Secondary | ICD-10-CM

## 2021-10-23 NOTE — Progress Notes (Signed)
No vb. No lof.  

## 2021-10-24 LAB — T4F: T4,Free (Direct): 0.93 ng/dL (ref 0.82–1.77)

## 2021-10-24 LAB — TSH RFX ON ABNORMAL TO FREE T4: TSH: 0.007 u[IU]/mL — ABNORMAL LOW (ref 0.450–4.500)

## 2021-10-28 DIAGNOSIS — E0590 Thyrotoxicosis, unspecified without thyrotoxic crisis or storm: Secondary | ICD-10-CM | POA: Insufficient documentation

## 2021-10-28 NOTE — Addendum Note (Signed)
Addended by: Althea Charon on: 10/28/2021 11:23 AM   Modules accepted: Orders

## 2021-10-30 ENCOUNTER — Other Ambulatory Visit: Payer: Self-pay

## 2021-10-30 ENCOUNTER — Encounter: Payer: Self-pay | Admitting: Obstetrics and Gynecology

## 2021-10-30 ENCOUNTER — Observation Stay
Admission: EM | Admit: 2021-10-30 | Discharge: 2021-10-30 | Disposition: A | Discharge status: Home / Self Care | Payer: Medicaid Other | Attending: Certified Nurse Midwife | Admitting: Certified Nurse Midwife

## 2021-10-30 DIAGNOSIS — Z3A31 31 weeks gestation of pregnancy: Secondary | ICD-10-CM | POA: Insufficient documentation

## 2021-10-30 DIAGNOSIS — M545 Low back pain, unspecified: Secondary | ICD-10-CM | POA: Diagnosis not present

## 2021-10-30 DIAGNOSIS — Z79899 Other long term (current) drug therapy: Secondary | ICD-10-CM | POA: Diagnosis not present

## 2021-10-30 DIAGNOSIS — O26893 Other specified pregnancy related conditions, third trimester: Secondary | ICD-10-CM | POA: Diagnosis present

## 2021-10-30 LAB — URINALYSIS, ROUTINE W REFLEX MICROSCOPIC
Bilirubin Urine: NEGATIVE
Glucose, UA: >=500 mg/dL — AB
Hgb urine dipstick: NEGATIVE
Ketones, ur: NEGATIVE mg/dL
Leukocytes,Ua: NEGATIVE
Nitrite: NEGATIVE
Protein, ur: NEGATIVE mg/dL
Specific Gravity, Urine: 1.014 (ref 1.005–1.030)
pH: 6 (ref 5.0–8.0)

## 2021-10-30 MED ORDER — OXYCODONE-ACETAMINOPHEN 5-325 MG PO TABS
1.0000 | ORAL_TABLET | Freq: Once | ORAL | Status: AC
  Administered 2021-10-30: 1 via ORAL
  Filled 2021-10-30: qty 1

## 2021-10-30 NOTE — OB Triage Note (Signed)
Pt is a G3P1011 at [redacted]w[redacted]d presenting to L&D c/o lower back pain describing it as "cramping" and "pressure." Pt also endorses perineum pressure. Pt denies urinary symptoms. Denies vaginal bleeding and LOF. +FM. VSS.

## 2021-10-30 NOTE — OB Triage Note (Signed)
    L&D OB Triage Note  SUBJECTIVE Sara Henson is a 21 y.o. G5P1011 female at [redacted]w[redacted]d, EDD Estimated Date of Delivery: 12/29/21 who presented to triage with complaints of lower back pain and pressure. She feels good movement, denies loss of fluid , and vaginal bleeding.   OB History  Gravida Para Term Preterm AB Living  3 1 1  0 1 1  SAB IAB Ectopic Multiple Live Births  1 0 0 0 1    # Outcome Date GA Lbr Len/2nd Weight Sex Delivery Anes PTL Lv  3 Current           2 Term 09/28/19 [redacted]w[redacted]d  4400 g F CS-LTranv EPI  LIV     Name: ABIGAL, CHOUNG     Apgar1: 8  Apgar5: 9  1 SAB 09/24/18            Medications Prior to Admission  Medication Sig Dispense Refill Last Dose   FLUoxetine (PROZAC) 20 MG capsule Take 60 mg by mouth every morning.      loratadine (CLARITIN) 10 MG tablet Take 1 tablet by mouth daily.      Prenatal Vit-Fe Fumarate-FA (PRENATAL MULTIVITAMIN) TABS tablet Take 1 tablet by mouth daily at 12 noon.      sildenafil (VIAGRA) 100 MG tablet Take by mouth.        OBJECTIVE  Nursing Evaluation:   BP 119/68 (BP Location: Left Arm)   Pulse (!) 101   Temp 99 F (37.2 C) (Oral)   Resp 18   LMP 03/24/2021    Findings:   no signs of Preterm Labor         NST was performed and has been reviewed by me.  NST INTERPRETATION: Category I  Mode: External Baseline Rate (A): 135 bpm Variability: Moderate Accelerations: 15 x 15 Decelerations: None     Contraction Frequency (min): none detected  ASSESSMENT Impression:  1.  Pregnancy:  G3P1011 at [redacted]w[redacted]d , EDD Estimated Date of Delivery: 12/29/21 2.  Reassuring fetal and maternal status 3.  Normal musculoskeletal discomforts in pregnancy   PLAN 1. Current condition and above findings reviewed.  Reassuring fetal and maternal condition. 2. Discharge home with standard labor precautions given to return to L&D or call the office for problems. 3. Continue routine prenatal care.     12/31/21, CNM

## 2021-11-05 ENCOUNTER — Telehealth: Payer: Self-pay

## 2021-11-05 NOTE — Telephone Encounter (Signed)
Pt called about an order for 36 week ultra sound, pt seen Dr Okey Dupre last visit. No order in for the ultrasound. I advised her when she sees Claris Che next week she could order it.

## 2021-11-12 ENCOUNTER — Encounter: Payer: Self-pay | Admitting: Obstetrics

## 2021-11-12 ENCOUNTER — Ambulatory Visit (INDEPENDENT_AMBULATORY_CARE_PROVIDER_SITE_OTHER): Payer: Medicaid Other | Admitting: Obstetrics

## 2021-11-12 VITALS — BP 122/78 | Wt 212.0 lb

## 2021-11-12 DIAGNOSIS — Z3480 Encounter for supervision of other normal pregnancy, unspecified trimester: Secondary | ICD-10-CM

## 2021-11-12 DIAGNOSIS — Z3A33 33 weeks gestation of pregnancy: Secondary | ICD-10-CM

## 2021-11-12 DIAGNOSIS — O0990 Supervision of high risk pregnancy, unspecified, unspecified trimester: Secondary | ICD-10-CM

## 2021-11-12 NOTE — Progress Notes (Signed)
Routine Prenatal Care Visit  Subjective  Sara Henson is a 21 y.o. G3P1011 at [redacted]w[redacted]d being seen today for ongoing prenatal care.  She is currently monitored for the following issues for this high-risk pregnancy and has Disordered eating; Subclinical hypothyroidism; Thyroid nodule; GERD without esophagitis; Heartburn during pregnancy, antepartum; Murmur; Tachycardia; PMDD (premenstrual dysphoric disorder); Supervision of other normal pregnancy, antepartum; Previous cesarean delivery, antepartum; History of postpartum depression, currently pregnant; History of eating disorder; Desires VBAC (vaginal birth after cesarean) trial; History of macrosomia in infant in prior pregnancy, currently pregnant; and Subclinical hyperthyroidism on their problem list.  ----------------------------------------------------------------------------------- Patient reports no complaints.   Contractions: Not present. Vag. Bleeding: None.  Movement: Present. Leaking Fluid denies.  ----------------------------------------------------------------------------------- The following portions of the patient's history were reviewed and updated as appropriate: allergies, current medications, past family history, past medical history, past social history, past surgical history and problem list. Problem list updated.  Objective  Blood pressure 122/78, weight 212 lb (96.2 kg), last menstrual period 03/24/2021. Pregravid weight 180 lb (81.6 kg) Total Weight Gain 32 lb (14.5 kg) Urinalysis: Urine Protein    Urine Glucose    Fetal Status:     Movement: Present     General:  Alert, oriented and cooperative. Patient is in no acute distress.  Skin: Skin is warm and dry. No rash noted.   Cardiovascular: Normal heart rate noted  Respiratory: Normal respiratory effort, no problems with respiration noted  Abdomen: Soft, gravid, appropriate for gestational age. Pain/Pressure: Absent     Pelvic:  Cervical exam deferred        Extremities: Normal  range of motion.     Mental Status: Normal mood and affect. Normal behavior. Normal judgment and thought content.   Assessment   21 y.o. G3P1011 at 108w2d by  12/29/2021, by Last Menstrual Period presenting for routine prenatal visit  Plan   pregnancy Problems (from 05/18/21 to present)    Problem Noted Resolved   Supervision of other normal pregnancy, antepartum 05/18/2021 by Mirna Mires, CNM No   Overview Addendum 11/12/2021  9:31 AM by Mirna Mires, CNM     Nursing Staff Provider  Office Location  [facility] Dating  [redacted]w[redacted]d 06/17/21  Language  English Anatomy US  Norm   Flu Vaccine   Genetic Screen  NIPS:   TDaP vaccine    Hgb A1C or  GTT Early : Third trimester : 126  Covid    LAB RESULTS   Rhogam  NA Blood Type   A Pos  Feeding Plan Breast Antibody  neg  Contraception  Rubella  immune  Circumcision Yes RPR   NR  Pediatrician  [facility] HBsAg   neg  Support Person  HIV  NR  Prenatal Classes multip  Varicella Immune    GBS  (For PCN allergy, check sensitivities)   BTL Consent     VBAC Consent  Pap  NILM    Hgb Electro      CF      SMA         Hx of primary Cs in 2021. >9lb baby) weight gain of 80 lbs. Desires VBAC.          Preterm labor symptoms and general obstetric precautions including but not limited to vaginal bleeding, contractions, leaking of fluid and fetal movement were reviewed in detail with the patient. Please refer to After Visit Summary for other counseling recommendations.   No follow-ups on file.  Mirna Mires, CNM  11/12/2021  9:32 AM

## 2021-11-12 NOTE — Addendum Note (Signed)
Addended by: Mirna Mires on: 11/12/2021 04:44 PM   Modules accepted: Orders

## 2021-11-12 NOTE — Progress Notes (Signed)
No vb. No lof.  

## 2021-11-20 ENCOUNTER — Other Ambulatory Visit: Payer: Self-pay | Admitting: Licensed Practical Nurse

## 2021-11-20 ENCOUNTER — Encounter: Payer: Medicaid Other | Admitting: Advanced Practice Midwife

## 2021-11-20 ENCOUNTER — Encounter: Payer: Self-pay | Admitting: Obstetrics

## 2021-11-20 ENCOUNTER — Observation Stay
Admission: EM | Admit: 2021-11-20 | Discharge: 2021-11-20 | Disposition: A | Discharge status: Home / Self Care | Payer: Medicaid Other | Attending: Licensed Practical Nurse | Admitting: Licensed Practical Nurse

## 2021-11-20 ENCOUNTER — Telehealth: Payer: Self-pay

## 2021-11-20 ENCOUNTER — Other Ambulatory Visit: Payer: Self-pay

## 2021-11-20 DIAGNOSIS — R42 Dizziness and giddiness: Secondary | ICD-10-CM | POA: Diagnosis not present

## 2021-11-20 DIAGNOSIS — Z98891 History of uterine scar from previous surgery: Secondary | ICD-10-CM | POA: Insufficient documentation

## 2021-11-20 DIAGNOSIS — O163 Unspecified maternal hypertension, third trimester: Secondary | ICD-10-CM | POA: Insufficient documentation

## 2021-11-20 DIAGNOSIS — Z79899 Other long term (current) drug therapy: Secondary | ICD-10-CM | POA: Insufficient documentation

## 2021-11-20 DIAGNOSIS — O26893 Other specified pregnancy related conditions, third trimester: Secondary | ICD-10-CM | POA: Diagnosis not present

## 2021-11-20 DIAGNOSIS — O99513 Diseases of the respiratory system complicating pregnancy, third trimester: Secondary | ICD-10-CM | POA: Insufficient documentation

## 2021-11-20 DIAGNOSIS — O26899 Other specified pregnancy related conditions, unspecified trimester: Secondary | ICD-10-CM | POA: Diagnosis present

## 2021-11-20 DIAGNOSIS — J45909 Unspecified asthma, uncomplicated: Secondary | ICD-10-CM | POA: Insufficient documentation

## 2021-11-20 DIAGNOSIS — O26813 Pregnancy related exhaustion and fatigue, third trimester: Secondary | ICD-10-CM | POA: Diagnosis not present

## 2021-11-20 DIAGNOSIS — Z3A34 34 weeks gestation of pregnancy: Secondary | ICD-10-CM | POA: Diagnosis not present

## 2021-11-20 DIAGNOSIS — O99891 Other specified diseases and conditions complicating pregnancy: Secondary | ICD-10-CM | POA: Diagnosis present

## 2021-11-20 DIAGNOSIS — Z3480 Encounter for supervision of other normal pregnancy, unspecified trimester: Secondary | ICD-10-CM

## 2021-11-20 DIAGNOSIS — R519 Headache, unspecified: Secondary | ICD-10-CM | POA: Diagnosis present

## 2021-11-20 DIAGNOSIS — Z87891 Personal history of nicotine dependence: Secondary | ICD-10-CM | POA: Insufficient documentation

## 2021-11-20 DIAGNOSIS — E039 Hypothyroidism, unspecified: Secondary | ICD-10-CM

## 2021-11-20 LAB — URINALYSIS, COMPLETE (UACMP) WITH MICROSCOPIC
Bacteria, UA: NONE SEEN
Bilirubin Urine: NEGATIVE
Glucose, UA: 150 mg/dL — AB
Hgb urine dipstick: NEGATIVE
Ketones, ur: NEGATIVE mg/dL
Leukocytes,Ua: NEGATIVE
Nitrite: NEGATIVE
Protein, ur: NEGATIVE mg/dL
Specific Gravity, Urine: 1.008 (ref 1.005–1.030)
pH: 7 (ref 5.0–8.0)

## 2021-11-20 LAB — CBC WITH DIFFERENTIAL/PLATELET
Abs Immature Granulocytes: 0.04 10*3/uL (ref 0.00–0.07)
Basophils Absolute: 0 10*3/uL (ref 0.0–0.1)
Basophils Relative: 0 %
Eosinophils Absolute: 0.1 10*3/uL (ref 0.0–0.5)
Eosinophils Relative: 1 %
HCT: 34.6 % — ABNORMAL LOW (ref 36.0–46.0)
Hemoglobin: 11.4 g/dL — ABNORMAL LOW (ref 12.0–15.0)
Immature Granulocytes: 1 %
Lymphocytes Relative: 19 %
Lymphs Abs: 1.4 10*3/uL (ref 0.7–4.0)
MCH: 27.3 pg (ref 26.0–34.0)
MCHC: 32.9 g/dL (ref 30.0–36.0)
MCV: 82.8 fL (ref 80.0–100.0)
Monocytes Absolute: 0.7 10*3/uL (ref 0.1–1.0)
Monocytes Relative: 9 %
Neutro Abs: 5.2 10*3/uL (ref 1.7–7.7)
Neutrophils Relative %: 70 %
Platelets: 189 10*3/uL (ref 150–400)
RBC: 4.18 MIL/uL (ref 3.87–5.11)
RDW: 12.1 % (ref 11.5–15.5)
WBC: 7.4 10*3/uL (ref 4.0–10.5)
nRBC: 0 % (ref 0.0–0.2)

## 2021-11-20 LAB — COMPREHENSIVE METABOLIC PANEL
ALT: 19 U/L (ref 0–44)
AST: 16 U/L (ref 15–41)
Albumin: 3.1 g/dL — ABNORMAL LOW (ref 3.5–5.0)
Alkaline Phosphatase: 93 U/L (ref 38–126)
Anion gap: 7 (ref 5–15)
BUN: 8 mg/dL (ref 6–20)
CO2: 21 mmol/L — ABNORMAL LOW (ref 22–32)
Calcium: 9 mg/dL (ref 8.9–10.3)
Chloride: 107 mmol/L (ref 98–111)
Creatinine, Ser: 0.49 mg/dL (ref 0.44–1.00)
GFR, Estimated: >60 mL/min (ref >60)
Glucose, Bld: 90 mg/dL (ref 70–99)
Potassium: 3.4 mmol/L — ABNORMAL LOW (ref 3.5–5.1)
Sodium: 135 mmol/L (ref 135–145)
Total Bilirubin: 0.6 mg/dL (ref 0.3–1.2)
Total Protein: 6 g/dL — ABNORMAL LOW (ref 6.5–8.1)

## 2021-11-20 LAB — PROTEIN / CREATININE RATIO, URINE
Creatinine, Urine: 29 mg/dL
Total Protein, Urine: <6 mg/dL

## 2021-11-20 MED ORDER — HYDROXYZINE HCL 25 MG PO TABS: 25.0000 mg | ORAL_TABLET | ORAL | Status: DC | PRN

## 2021-11-20 MED ORDER — LACTATED RINGERS IV BOLUS
1000.0000 mL | Freq: Once | INTRAVENOUS | Status: AC
  Administered 2021-11-20: 1000 mL via INTRAVENOUS

## 2021-11-20 NOTE — Discharge Summary (Signed)
Physician Final Progress Note  Patient ID: Sara Henson MRN: [id number] DOB/AGE: [birth year] 21 y.o.  Admit date: 11/20/2021 Admitting provider: Horald Pollen, MD Discharge date: 11/20/2021   Admission Diagnoses:  1) intrauterine pregnancy at [redacted]w[redacted]d  2) fatigue   Discharge Diagnoses:  Principal Problem:   Headache in pregnancy    History of Present Illness: The patient is a 21 y.o. female G3P1011 at [redacted]w[redacted]d who presents for headache which is resolving, dizziness, and feeling "not well".  She had a HA earlier today, she took Tylenol and checked her BP, it was 112./91. Her headache has come down from a "7/10" to a "2/10".  She came in because she knew her BP reading was high and she just did not feel well in the sense that she is tired, she does have some nausea, denies any vomiting or diarrhea.  Denies fevers.  Does have some cramping (but this is not new for her) but otherwise denies contractions or abd pain. Denies any sick contacts. She did get about 6 hours of sleep overnight, but it does not sound like it was restful. Reports she has spent most of the day inside. Endorses +FM.   Pt's thyroid labs from June-Pt states she has had a thyroid nodule since she was pregnant with her first child, she feels like it has gotten bigger and sometimes feels in the way, denies difficulty breathing.   Pt mentioned she was told she needs a growth [place] at 36 wks but nobody has called to schedule her.   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   Had inpatient treatment at [facility] perinatal pysch    Past Surgical History:  Procedure Laterality Date   CESAREAN SECTION N/A 09/28/2019   Procedure: CESAREAN SECTION;  Surgeon: Vena Austria, MD;  Location: [facility] ORS;  Service: Obstetrics;  Laterality: N/A;   YQM:GNOIBB [id number] apgar-8,9 weight 9lbs 11oz   COLPOSCOPY     TONSILLECTOMY     TYMPANOSTOMY TUBE PLACEMENT      No current facility-administered medications on file prior to encounter.   Current Outpatient Medications on File Prior to Encounter  Medication Sig Dispense Refill   FLUoxetine (PROZAC) 20 MG capsule Take 60 mg by mouth every morning.     Prenatal Vit-Fe Fumarate-FA (PRENATAL MULTIVITAMIN) TABS tablet Take 1 tablet by mouth daily at 12 noon.     loratadine (CLARITIN) 10 MG tablet Take 1 tablet by mouth daily.      No Known Allergies  Social History   Socioeconomic History   Marital status: Married    Spouse name: Gardiner Barefoot   Number of children: 0   Years of education: 13   Highest education level: High school graduate  Occupational History   Occupation: Unemployed  Tobacco Use   Smoking status: Never   Smokeless tobacco: Former    Types: Associate Professor Use: Former   Devices: quit 2020  Substance and Sexual Activity   Alcohol use: Not Currently   Drug use: No   Sexual activity: Yes  Other Topics Concern   Not on file  Social History Narrative   Not on file   Social Determinants of Health   Financial Resource Strain: Not on file  Food Insecurity: Not on file  Transportation Needs: Not on file  Physical Activity: Not on file  Stress: Not on file  Social Connections: Not on file  Intimate Partner Violence: Not on file    Family History  Problem Relation Age of Onset   Skin cancer Father      Review of Systems  Constitutional:  Positive for malaise/fatigue. Negative for chills and fever.  Eyes: Negative.   Respiratory: Negative.    Cardiovascular: Negative.   Gastrointestinal:  Positive for nausea. Negative for diarrhea and vomiting.       Cramping  Genitourinary: Negative.   Musculoskeletal: Negative.   Neurological: Negative.   Psychiatric/Behavioral: Negative.       Physical Exam: BP (!) 111/59   Pulse 74   Temp 98.4  F (36.9 C) (Oral)   Resp 17   Ht 5\' 7"  (1.702 m)   Wt 96.2 kg   LMP 03/24/2021   BMI 33.20 kg/m   Physical Exam Constitutional:      Appearance: Normal appearance.  Genitourinary:     Genitourinary Comments: Exam deferred  Neck:     Comments: Palpable nodule  Pulmonary:     Effort: Pulmonary effort is normal.  Abdominal:     Tenderness: There is no abdominal tenderness.     Comments: gravid  Musculoskeletal:     Cervical back: Normal range of motion.     Right lower leg: No edema.     Left lower leg: Edema present.  Neurological:     General: No focal deficit present.     Mental Status: She is alert.  Psychiatric:        Mood and Affect: Mood normal.   EFM: baseline 145, moderate variability, pos accel, neg decel TOCO: irritability   CBC    Component Value Date/Time   WBC 7.4 11/20/2021 1555   RBC 4.18 11/20/2021 1555   HGB 11.4 (L) 11/20/2021 1555   HGB 12.3 10/07/2021 1022   HCT 34.6 (L) 11/20/2021 1555   HCT 37.2 10/07/2021 1022   PLT 189 11/20/2021 1555   PLT 199 10/07/2021 1022   MCV 82.8 11/20/2021 1555   MCV 88 10/07/2021 1022   MCH 27.3 11/20/2021 1555   MCHC 32.9 11/20/2021 1555   RDW 12.1 11/20/2021 1555   RDW 12.6 10/07/2021 1022   LYMPHSABS 1.4 11/20/2021 1555   LYMPHSABS 1.3 10/07/2021 1022   MONOABS 0.7 11/20/2021 1555   EOSABS 0.1 11/20/2021 1555   EOSABS 0.1 10/07/2021 1022   BASOSABS 0.0 11/20/2021 1555   BASOSABS 0.0 10/07/2021 1022      Latest Ref Rng & Units 11/20/2021    3:55 PM 09/27/2019    8:26 PM 09/26/2019    2:34 PM  CMP  Glucose 70 - 99 mg/dL 90  09/28/2019  99   BUN 6 - 20 mg/dL 8  13  9    Creatinine 0.44 - 1.00 mg/dL 672   0.94   Sodium 135 - 145 mmol/L 135  136  136   Potassium 3.5 - 5.1 mmol/L 3.4  3.9  4.2   Chloride 98 - 111 mmol/L 107  104  103   CO2 22 - 32 mmol/L 21  21  17    Calcium 8.9 - 10.3 mg/dL 9.0  9.3  9.3   Total Protein 6.5 - 8.1 g/dL 6.0  6.3  5.9   Total Bilirubin 0.3 - 1.2 mg/dL 0.6  0.6  0.2    Alkaline Phos 38 - 126 U/L 93  151  176   AST 15 - 41 U/L 16  21  20  ALT 0 - 44 U/L 19  15  14       Consults:  Anesthesia, to assess airway given presence of nodule.  Pt has hx of c/s.   Significant Findings/ Diagnostic Studies: labs WNL   Procedures: RNST   Hospital Course: The patient was admitted to Labor and Delivery Triage for observation. She was given a liter of LR. Labs were WNL. Anesthesia evaluated her. By the end of her visit she denied a HA. Declined medication for HA and nausea.   Reported feeling tired but "ok". Desired to go home.   -reviewed sxs could be related to being pregnant in the third trimester in the heat.  There is a GI bug going around, she could be coming down with a GI bug or other communicable illness-comfort measures reviewed.  -Message sent to staff at [facility] to help arrange growth [facility] -referral placed for endocrinology   Discharge Condition: stable  Disposition: Discharge disposition: 01-Home or Self Care       Diet: Regular diet  Discharge Activity: Activity as tolerated   Allergies as of 11/20/2021   No Known Allergies      Medication List     TAKE these medications    FLUoxetine 20 MG capsule Commonly known as: PROZAC Take 60 mg by mouth every morning.   loratadine 10 MG tablet Commonly known as: CLARITIN Take 1 tablet by mouth daily.   prenatal multivitamin Tabs tablet Take 1 tablet by mouth daily at 12 noon.         Total time spent taking care of this patient: 30 minutes  Signed: 11/22/2021 [facility], CNM  11/20/2021, 5:35 PM

## 2021-11-20 NOTE — OB Triage Note (Signed)
Pt presents for PIH eval. BP at home 112/91. +FM. Denies LOF, bleeding, ctx. Pt reports she woke up not feeling well this morning. HA 7/10 and took tylenol. HA now 2/10. Denies epigastric pain/vision changes. BP cycling.

## 2021-11-20 NOTE — Telephone Encounter (Signed)
Pt calling; 34wks; not feeling well at all today; H/A, nausea, hot flashes, fatigue, BP 112/91.  [phone]  Pt states she has eaten today, has taken two e.s. tylenol and had some caffeine; also states for the last few days she has had SOB on a regular basis.  Adv to come in at 2:15 to see JEG; pt states she will be a few minutes later like 2:25 getting here.

## 2021-11-25 ENCOUNTER — Telehealth: Payer: Self-pay | Admitting: Nurse Practitioner

## 2021-11-25 NOTE — Telephone Encounter (Signed)
I Received a referral on this patient who appears to be pregnant. It is for her thyroid. Where do you want me to put her?

## 2021-11-26 NOTE — Telephone Encounter (Signed)
I read over her recent ED visit.  She is worried about her thyroid nodule.  I reviewed her labs and they look ok.  We wont need to see her until after she delivers her baby (which will be relatively soon since she is [redacted] weeks pregnant).  There is no rush to get her seen prior to delivery.

## 2021-11-27 ENCOUNTER — Ambulatory Visit (INDEPENDENT_AMBULATORY_CARE_PROVIDER_SITE_OTHER): Payer: Medicaid Other | Admitting: Licensed Practical Nurse

## 2021-11-27 VITALS — BP 102/54 | Wt 215.4 lb

## 2021-11-27 DIAGNOSIS — F509 Eating disorder, unspecified: Secondary | ICD-10-CM

## 2021-11-27 DIAGNOSIS — O99613 Diseases of the digestive system complicating pregnancy, third trimester: Secondary | ICD-10-CM

## 2021-11-27 DIAGNOSIS — R81 Glycosuria: Secondary | ICD-10-CM

## 2021-11-27 DIAGNOSIS — Z3480 Encounter for supervision of other normal pregnancy, unspecified trimester: Secondary | ICD-10-CM

## 2021-11-27 DIAGNOSIS — Z3A35 35 weeks gestation of pregnancy: Secondary | ICD-10-CM

## 2021-11-27 DIAGNOSIS — O99343 Other mental disorders complicating pregnancy, third trimester: Secondary | ICD-10-CM | POA: Diagnosis not present

## 2021-11-27 DIAGNOSIS — E02 Subclinical iodine-deficiency hypothyroidism: Secondary | ICD-10-CM

## 2021-11-27 DIAGNOSIS — O99283 Endocrine, nutritional and metabolic diseases complicating pregnancy, third trimester: Secondary | ICD-10-CM

## 2021-11-27 DIAGNOSIS — K219 Gastro-esophageal reflux disease without esophagitis: Secondary | ICD-10-CM

## 2021-11-27 LAB — POCT URINALYSIS DIPSTICK OB: POC,PROTEIN,UA: NEGATIVE

## 2021-11-27 LAB — POCT GLYCOSYLATED HEMOGLOBIN (HGB A1C): Hemoglobin A1C: 9.6 % — AB (ref 4.0–5.6)

## 2021-11-27 LAB — GLUCOSE, POCT (MANUAL RESULT ENTRY): POC Glucose: 137 mg/dl — AB (ref 70–99)

## 2021-11-27 NOTE — Progress Notes (Signed)
Routine Prenatal Care Visit  Subjective  Sara Henson is a 21 y.o. G3P1011 at [redacted]w[redacted]d being seen today for ongoing prenatal care.  She is currently monitored for the following issues for this low-risk pregnancy and has Disordered eating; Subclinical hypothyroidism; Thyroid nodule; GERD without esophagitis; Heartburn during pregnancy, antepartum; Murmur; Tachycardia; PMDD (premenstrual dysphoric disorder); Supervision of other normal pregnancy, antepartum; Previous cesarean delivery, antepartum; History of postpartum depression, currently pregnant; History of eating disorder; Desires VBAC (vaginal birth after cesarean) trial; History of macrosomia in infant in prior pregnancy, currently pregnant; Subclinical hyperthyroidism; and Headache in pregnancy on their problem list.  ----------------------------------------------------------------------------------- Patient reports  B-H contractions, sleep disturbances .  Here with daughter. Feeling tired.  -glucose in urine, random POC BS 137, pt ate cereal with honey and milk prior to appointment will check serum glucose and HA1C -random POC hgb 9.6, will get CBC and start Fe supplement  -reviewed wt gain, gained closer to 70lbs with last pregnancy Contractions: Irritability. Vag. Bleeding: None.  Movement: Present. Leaking Fluid denies.  ----------------------------------------------------------------------------------- The following portions of the patient's history were reviewed and updated as appropriate: allergies, current medications, past family history, past medical history, past social history, past surgical history and problem list. Problem list updated.  Objective  Blood pressure (!) 102/54, weight 215 lb 6.4 oz (97.7 kg), last menstrual period 03/24/2021. Pregravid weight 180 lb (81.6 kg) Total Weight Gain 35 lb 6.4 oz (16.1 kg) Urinalysis: Urine Protein Negative  Urine Glucose (!) Large (3+)  Fetal Status: Fetal Heart Rate (bpm): 155 Fundal  Height: 37 cm Movement: Present     General:  Alert, oriented and cooperative. Patient is in no acute distress.  Skin: Skin is warm and dry. No rash noted.   Cardiovascular: Normal heart rate noted  Respiratory: Normal respiratory effort, no problems with respiration noted  Abdomen: Soft, gravid, appropriate for gestational age. Pain/Pressure: Present     Pelvic:  Cervical exam deferred        Extremities: Normal range of motion.  Edema: Trace  Mental Status: Normal mood and affect. Normal behavior. Normal judgment and thought content.   Assessment   21 y.o. G3P1011 at [redacted]w[redacted]d by  12/29/2021, by Last Menstrual Period presenting for routine prenatal visit  Plan   pregnancy Problems (from 05/18/21 to present)     Problem Noted Resolved   Supervision of other normal pregnancy, antepartum 05/18/2021 by Mirna Mires, CNM No   Overview Addendum 11/12/2021  9:31 AM by Mirna Mires, CNM     Nursing Staff Provider  Office Location  [facility] Dating  [redacted]w[redacted]d 06/17/21  Language  English Anatomy US  Norm   Flu Vaccine   Genetic Screen  NIPS:   TDaP vaccine    Hgb A1C or  GTT Early : Third trimester : 126  Covid    LAB RESULTS   Rhogam  NA Blood Type   A Pos  Feeding Plan Breast Antibody  neg  Contraception  Rubella  immune  Circumcision Yes RPR   NR  Pediatrician  [facility] HBsAg   neg  Support Person  HIV  NR  Prenatal Classes multip  Varicella Immune    GBS  (For PCN allergy, check sensitivities)   BTL Consent     VBAC Consent  Pap  NILM    Hgb Electro      CF      SMA         Hx of primary Cs in 2021. >9lb baby)  weight gain of 80 lbs. Desires VBAC.           Preterm labor symptoms and general obstetric precautions including but not limited to vaginal bleeding, contractions, leaking of fluid and fetal movement were reviewed in detail with the patient. Please refer to After Visit Summary for other counseling recommendations.   Return in about 1 week (around  12/04/2021) for ROB.  36 week labs next visit   Carie Caddy, CNM  [facility]  11/27/21  11:56 AM  '

## 2021-11-28 LAB — CBC WITH DIFFERENTIAL/PLATELET
Basophils Absolute: 0 10*3/uL (ref 0.0–0.2)
Basos: 0 %
EOS (ABSOLUTE): 0.1 10*3/uL (ref 0.0–0.4)
Eos: 1 %
Hematocrit: 32.4 % — ABNORMAL LOW (ref 34.0–46.6)
Hemoglobin: 10.7 g/dL — ABNORMAL LOW (ref 11.1–15.9)
Immature Grans (Abs): 0 10*3/uL (ref 0.0–0.1)
Immature Granulocytes: 0 %
Lymphocytes Absolute: 1 10*3/uL (ref 0.7–3.1)
Lymphs: 16 %
MCH: 27 pg (ref 26.6–33.0)
MCHC: 33 g/dL (ref 31.5–35.7)
MCV: 82 fL (ref 79–97)
Monocytes Absolute: 0.6 10*3/uL (ref 0.1–0.9)
Monocytes: 9 %
Neutrophils Absolute: 4.6 10*3/uL (ref 1.4–7.0)
Neutrophils: 74 %
Platelets: 172 10*3/uL (ref 150–450)
RBC: 3.96 x10E6/uL (ref 3.77–5.28)
RDW: 12.4 % (ref 11.7–15.4)
WBC: 6.3 10*3/uL (ref 3.4–10.8)

## 2021-11-28 LAB — HEMOGLOBIN A1C
Est. average glucose Bld gHb Est-mCnc: 108 mg/dL
Hgb A1c MFr Bld: 5.4 % (ref 4.8–5.6)

## 2021-11-28 LAB — GLUCOSE, RANDOM: Glucose: 125 mg/dL — ABNORMAL HIGH (ref 70–99)

## 2021-12-08 ENCOUNTER — Ambulatory Visit (INDEPENDENT_AMBULATORY_CARE_PROVIDER_SITE_OTHER): Payer: Medicaid Other | Admitting: Obstetrics & Gynecology

## 2021-12-08 ENCOUNTER — Other Ambulatory Visit (HOSPITAL_COMMUNITY)
Admission: RE | Admit: 2021-12-08 | Discharge: 2021-12-08 | Disposition: A | Discharge status: Home / Self Care | Payer: Medicaid Other | Source: Ambulatory Visit | Attending: Obstetrics & Gynecology | Admitting: Obstetrics & Gynecology

## 2021-12-08 ENCOUNTER — Encounter: Payer: Medicaid Other | Admitting: Obstetrics & Gynecology

## 2021-12-08 VITALS — BP 120/80 | Wt 216.0 lb

## 2021-12-08 DIAGNOSIS — Z113 Encounter for screening for infections with a predominantly sexual mode of transmission: Secondary | ICD-10-CM

## 2021-12-08 DIAGNOSIS — Z3A37 37 weeks gestation of pregnancy: Secondary | ICD-10-CM

## 2021-12-08 DIAGNOSIS — Z3685 Encounter for antenatal screening for Streptococcus B: Secondary | ICD-10-CM

## 2021-12-08 DIAGNOSIS — O34219 Maternal care for unspecified type scar from previous cesarean delivery: Secondary | ICD-10-CM

## 2021-12-08 DIAGNOSIS — O09299 Supervision of pregnancy with other poor reproductive or obstetric history, unspecified trimester: Secondary | ICD-10-CM

## 2021-12-08 DIAGNOSIS — Z3480 Encounter for supervision of other normal pregnancy, unspecified trimester: Secondary | ICD-10-CM

## 2021-12-08 DIAGNOSIS — O26843 Uterine size-date discrepancy, third trimester: Secondary | ICD-10-CM

## 2021-12-08 DIAGNOSIS — Z369 Encounter for antenatal screening, unspecified: Secondary | ICD-10-CM

## 2021-12-08 DIAGNOSIS — E038 Other specified hypothyroidism: Secondary | ICD-10-CM

## 2021-12-08 DIAGNOSIS — O99210 Obesity complicating pregnancy, unspecified trimester: Secondary | ICD-10-CM

## 2021-12-08 NOTE — Progress Notes (Signed)
   PRENATAL VISIT NOTE  Subjective:  Sara Henson is a 21 y.o. G3P1011 at [redacted]w[redacted]d being seen today for ongoing prenatal care.  She is currently monitored for the following issues for this high-risk pregnancy and has Disordered eating; Subclinical hypothyroidism; Thyroid nodule; GERD without esophagitis; Heartburn during pregnancy, antepartum; Murmur; Tachycardia; PMDD (premenstrual dysphoric disorder); Supervision of other normal pregnancy, antepartum; Previous cesarean delivery, antepartum; History of postpartum depression, currently pregnant; History of eating disorder; Desires VBAC (vaginal birth after cesarean) trial; History of macrosomia in infant in prior pregnancy, currently pregnant; Subclinical hyperthyroidism; and Headache in pregnancy on their problem list.  Patient reports no complaints.  Contractions: Irritability. Vag. Bleeding: None.  Movement: Present. Denies leaking of fluid.   The following portions of the patient's history were reviewed and updated as appropriate: allergies, current medications, past family history, past medical history, past social history, past surgical history and problem list.   Objective:   Vitals:   12/08/21 1257  BP: 120/80  Weight: 216 lb (98 kg)    Fetal Status:     Movement: Present     General:  Alert, oriented and cooperative. Patient is in no acute distress.  Skin: Skin is warm and dry. No rash noted.   Cardiovascular: Normal heart rate noted  Respiratory: Normal respiratory effort, no problems with respiration noted  Abdomen: Soft, gravid, 40 cm, EFW by Leopold's is about 8 pounds today.   Cervix:  Closed/50%/high Pain/Pressure: Present     Pelvic:  Reasonable pelvis        Extremities: Normal range of motion.     Mental Status: Normal mood and affect. Normal behavior. Normal judgment and thought content.   Assessment and Plan:  Pregnancy: G3P1011 at [redacted]w[redacted]d 1. Screening examination for venereal disease  - GC/Chlamydia Probe Amp  2.  Antenatal screening for streptococcus B  - Strep Gp B NAA  3. Antenatal screening encounter  - Strep Gp B NAA  4. Supervision of other normal pregnancy, antepartum  - POC Urinalysis Dipstick OB  5. Subclinical hypothyroidism Recent free T4 is normal  6. Desires VBAC (vaginal birth after cesarean) trial I have ordered a growth scan since her fundal height is 3 cm greater than her weeks.  7. Obesity in pregnancy   8. [redacted] weeks gestation of pregnancy  Weekly visits   labor symptoms and general obstetric precautions including but not limited to vaginal bleeding, contractions, leaking of fluid and fetal movement were reviewed in detail witPretermh the patient. Please refer to After Visit Summary for other counseling recommendations.   No follow-ups on file.  Future Appointments  Date Time Provider Department Center  01/18/2022  1:00 PM Dani Gobble, NP [facility] None    Allie Bossier, MD

## 2021-12-08 NOTE — Addendum Note (Signed)
Addended by: Cornelius Moras D on: 12/08/2021 01:44 PM   Modules accepted: Orders

## 2021-12-09 DIAGNOSIS — Z3480 Encounter for supervision of other normal pregnancy, unspecified trimester: Secondary | ICD-10-CM

## 2021-12-09 NOTE — Progress Notes (Signed)
This encounter was created in error - please disregard.

## 2021-12-09 NOTE — Addendum Note (Signed)
Addended by: Kathlene Cote on: 12/09/2021 04:26 PM   Modules accepted: Orders

## 2021-12-10 LAB — CERVICOVAGINAL ANCILLARY ONLY
Chlamydia: NEGATIVE
Comment: NEGATIVE
Comment: NORMAL
Neisseria Gonorrhea: NEGATIVE

## 2021-12-10 LAB — STREP GP B NAA: Strep Gp B NAA: NEGATIVE

## 2021-12-14 ENCOUNTER — Ambulatory Visit: Payer: Medicaid Other | Admitting: *Deleted

## 2021-12-14 ENCOUNTER — Ambulatory Visit: Payer: Medicaid Other | Attending: Obstetrics & Gynecology

## 2021-12-14 VITALS — BP 134/68 | HR 92

## 2021-12-14 DIAGNOSIS — O34219 Maternal care for unspecified type scar from previous cesarean delivery: Secondary | ICD-10-CM | POA: Diagnosis present

## 2021-12-14 DIAGNOSIS — O09299 Supervision of pregnancy with other poor reproductive or obstetric history, unspecified trimester: Secondary | ICD-10-CM | POA: Insufficient documentation

## 2021-12-14 DIAGNOSIS — O26843 Uterine size-date discrepancy, third trimester: Secondary | ICD-10-CM | POA: Insufficient documentation

## 2021-12-15 ENCOUNTER — Ambulatory Visit (INDEPENDENT_AMBULATORY_CARE_PROVIDER_SITE_OTHER): Payer: Medicaid Other | Admitting: Licensed Practical Nurse

## 2021-12-15 ENCOUNTER — Encounter: Payer: Self-pay | Admitting: Licensed Practical Nurse

## 2021-12-15 VITALS — BP 140/84 | Wt 216.0 lb

## 2021-12-15 DIAGNOSIS — Z3A38 38 weeks gestation of pregnancy: Secondary | ICD-10-CM

## 2021-12-15 DIAGNOSIS — O0990 Supervision of high risk pregnancy, unspecified, unspecified trimester: Secondary | ICD-10-CM

## 2021-12-15 DIAGNOSIS — O163 Unspecified maternal hypertension, third trimester: Secondary | ICD-10-CM

## 2021-12-15 LAB — POCT URINALYSIS DIPSTICK
Glucose, UA: NEGATIVE
Protein, UA: NEGATIVE

## 2021-12-15 NOTE — Progress Notes (Signed)
Routine Prenatal Care Visit  Subjective  Sara Henson is a 20 y.o. G3P1011 at [redacted]w[redacted]d being seen today for ongoing prenatal care.  She is currently monitored for the following issues for this high-risk pregnancy and has Disordered eating; Subclinical hypothyroidism; Thyroid nodule; GERD without esophagitis; Heartburn during pregnancy, antepartum; Murmur; Tachycardia; PMDD (premenstrual dysphoric disorder); Supervision of other normal pregnancy, antepartum; Previous cesarean delivery, antepartum; History of postpartum depression, currently pregnant; History of eating disorder; Desires VBAC (vaginal birth after cesarean) trial; History of macrosomia in infant in prior pregnancy, currently pregnant; Subclinical hyperthyroidism; and Headache in pregnancy on their problem list.  ----------------------------------------------------------------------------------- Patient reports  Had a Headache yesterday, but was in a warm/hot environment felt better once in [facility] and had a snack.  .  Has seen "sparks" in her vision over the last few weeks. -Saw MFM for growth scan, EFW 9lbs 1oz, EFW >99% per MFM recommend IOL at 39 weeks, reviewed plan with Dr Valentino Saxon, given MFMF recommendation will scheduled IOL.  Reviewed plan for IOL, pt TOLAC, understands we cannot use cytotec, pt desires IOL -elevated BP today, preeclampsia labs collected, will return on Friday for BP check  Contractions: Not present. Vag. Bleeding: None.  Movement: Present. Leaking Fluid denies.  ----------------------------------------------------------------------------------- The following portions of the patient's history were reviewed and updated as appropriate: allergies, current medications, past family history, past medical history, past social history, past surgical history and problem list. Problem list updated.  Objective  Blood pressure (!) 140/84, weight 216 lb (98 kg), last menstrual period 03/24/2021. Pregravid weight 180 lb (81.6 kg) Total  Weight Gain 36 lb (16.3 kg) Urinalysis: Urine Protein    Urine Glucose    Fetal Status: Fetal Heart Rate (bpm): 150   Movement: Present  Presentation: Vertex  General:  Alert, oriented and cooperative. Patient is in no acute distress.  Skin: Skin is warm and dry. No rash noted.   Cardiovascular: Normal heart rate noted  Respiratory: Normal respiratory effort, no problems with respiration noted  Abdomen: Soft, gravid, appropriate for gestational age. Pain/Pressure: Present     Pelvic:  Cervical exam performed Dilation: Fingertip Effacement (%): 50 Station: -3  Extremities: Normal range of motion.     Mental Status: Normal mood and affect. Normal behavior. Normal judgment and thought content.   Assessment   20 y.o. G3P1011 at [redacted]w[redacted]d by  12/29/2021, by Last Menstrual Period presenting for routine prenatal visit  Plan   pregnancy Problems (from 05/18/21 to present)     Problem Noted Resolved   Supervision of other normal pregnancy, antepartum 05/18/2021 by Mirna Mires, CNM No   Overview Addendum 11/12/2021  9:31 AM by Mirna Mires, CNM     Nursing Staff Provider  Office Location  [facility] Dating  [redacted]w[redacted]d 06/17/21  Language  English Anatomy US  Norm   Flu Vaccine   Genetic Screen  NIPS:   TDaP vaccine    Hgb A1C or  GTT Early : Third trimester : 126  Covid    LAB RESULTS   Rhogam  NA Blood Type   A Pos  Feeding Plan Breast Antibody  neg  Contraception  Rubella  immune  Circumcision Yes RPR   NR  Pediatrician  [facility] HBsAg   neg  Support Person  HIV  NR  Prenatal Classes multip  Varicella Immune    GBS  (For PCN allergy, check sensitivities)   BTL Consent     VBAC Consent  Pap  NILM    Hgb  Electro      CF      SMA         Hx of primary Cs in 2021. >9lb baby) weight gain of 80 lbs. Desires VBAC.           Term labor symptoms and general obstetric precautions including but not limited to vaginal bleeding, contractions, leaking of fluid and fetal movement  were reviewed in detail with the patient. Please refer to After Visit Summary for other counseling recommendations.   Return in 3 days (on 12/18/2021) for BP check, NST.  IOL 8/22 at 0800, orders placed   Carie Caddy, CNM  Domingo Pulse, [facility]  12/15/21  1:00 PM

## 2021-12-15 NOTE — Progress Notes (Signed)
No vb. No lof. Cervical check today  °

## 2021-12-16 ENCOUNTER — Telehealth: Payer: Self-pay | Admitting: Licensed Practical Nurse

## 2021-12-16 ENCOUNTER — Encounter: Payer: Self-pay | Admitting: Licensed Practical Nurse

## 2021-12-16 LAB — CBC WITH DIFFERENTIAL/PLATELET
Basophils Absolute: 0 10*3/uL (ref 0.0–0.2)
Basos: 0 %
EOS (ABSOLUTE): 0 10*3/uL (ref 0.0–0.4)
Eos: 1 %
Hematocrit: 37.4 % (ref 34.0–46.6)
Hemoglobin: 12.5 g/dL (ref 11.1–15.9)
Immature Grans (Abs): 0 10*3/uL (ref 0.0–0.1)
Immature Granulocytes: 0 %
Lymphocytes Absolute: 1 10*3/uL (ref 0.7–3.1)
Lymphs: 16 %
MCH: 27.5 pg (ref 26.6–33.0)
MCHC: 33.4 g/dL (ref 31.5–35.7)
MCV: 82 fL (ref 79–97)
Monocytes Absolute: 0.4 10*3/uL (ref 0.1–0.9)
Monocytes: 7 %
Neutrophils Absolute: 4.7 10*3/uL (ref 1.4–7.0)
Neutrophils: 76 %
Platelets: 191 10*3/uL (ref 150–450)
RBC: 4.54 x10E6/uL (ref 3.77–5.28)
RDW: 14.4 % (ref 11.7–15.4)
WBC: 6.2 10*3/uL (ref 3.4–10.8)

## 2021-12-16 LAB — COMPREHENSIVE METABOLIC PANEL
ALT: 15 IU/L (ref 0–32)
AST: 14 IU/L (ref 0–40)
Albumin/Globulin Ratio: 1.9 (ref 1.2–2.2)
Albumin: 3.7 g/dL — ABNORMAL LOW (ref 4.0–5.0)
Alkaline Phosphatase: 155 IU/L — ABNORMAL HIGH (ref 44–121)
BUN/Creatinine Ratio: 10 (ref 9–23)
BUN: 5 mg/dL — ABNORMAL LOW (ref 6–20)
Bilirubin Total: 0.4 mg/dL (ref 0.0–1.2)
CO2: 18 mmol/L — ABNORMAL LOW (ref 20–29)
Calcium: 9 mg/dL (ref 8.7–10.2)
Chloride: 106 mmol/L (ref 96–106)
Creatinine, Ser: 0.51 mg/dL — ABNORMAL LOW (ref 0.57–1.00)
Globulin, Total: 2 g/dL (ref 1.5–4.5)
Glucose: 132 mg/dL — ABNORMAL HIGH (ref 70–99)
Potassium: 3.9 mmol/L (ref 3.5–5.2)
Sodium: 139 mmol/L (ref 134–144)
Total Protein: 5.7 g/dL — ABNORMAL LOW (ref 6.0–8.5)
eGFR: 134 mL/min/{1.73_m2} (ref >59)

## 2021-12-16 NOTE — Telephone Encounter (Signed)
Pt called in because she have her test result back from yesterday and have questions about them. Pt said once's you review them can you please call her back.

## 2021-12-17 ENCOUNTER — Encounter: Payer: Self-pay | Admitting: Licensed Practical Nurse

## 2021-12-18 ENCOUNTER — Ambulatory Visit (INDEPENDENT_AMBULATORY_CARE_PROVIDER_SITE_OTHER): Payer: Medicaid Other | Admitting: Licensed Practical Nurse

## 2021-12-18 ENCOUNTER — Other Ambulatory Visit: Payer: Medicaid Other

## 2021-12-18 VITALS — BP 110/70 | Wt 218.0 lb

## 2021-12-18 DIAGNOSIS — Z3A38 38 weeks gestation of pregnancy: Secondary | ICD-10-CM

## 2021-12-18 DIAGNOSIS — O0990 Supervision of high risk pregnancy, unspecified, unspecified trimester: Secondary | ICD-10-CM | POA: Diagnosis not present

## 2021-12-18 NOTE — Progress Notes (Signed)
Routine Prenatal Care Visit  Subjective  Sara Henson is a 21 y.o. G3P1011 at [redacted]w[redacted]d being seen today for ongoing prenatal care.  She is currently monitored for the following issues for this high-risk pregnancy and has Disordered eating; Subclinical hypothyroidism; Thyroid nodule; GERD without esophagitis; Heartburn during pregnancy, antepartum; Murmur; Tachycardia; PMDD (premenstrual dysphoric disorder); Supervision of other normal pregnancy, antepartum; Previous cesarean delivery, antepartum; History of postpartum depression, currently pregnant; History of eating disorder; Desires VBAC (vaginal birth after cesarean) trial; History of macrosomia in infant in prior pregnancy, currently pregnant; Subclinical hyperthyroidism; and Headache in pregnancy on their problem list.  ----------------------------------------------------------------------------------- Patient reports no complaints.  Here for BP check and NST. Denies any headaches, visual disturbances or RUQ pain. Has been feeling crampy and maybe a few contractions.  Contractions: Not present. Vag. Bleeding: None.  Movement: Present. Leaking Fluid denies.  ----------------------------------------------------------------------------------- The following portions of the patient's history were reviewed and updated as appropriate: allergies, current medications, past family history, past medical history, past social history, past surgical history and problem list. Problem list updated.  Objective  Blood pressure 110/70, weight 218 lb (98.9 kg), last menstrual period 03/24/2021. Pregravid weight 180 lb (81.6 kg) Total Weight Gain 38 lb (17.2 kg) Urinalysis: Urine Protein    Urine Glucose    Fetal Status: Fetal Heart Rate (bpm): RNST   Movement: Present  Presentation: Vertex Baseline 140, moderate variability, pos accel, neg decel TOCO: occasional contraction, soft resting tone  General:  Alert, oriented and cooperative. Patient is in no acute  distress.  Skin: Skin is warm and dry. No rash noted.   Cardiovascular: Normal heart rate noted  Respiratory: Normal respiratory effort, no problems with respiration noted  Abdomen: Soft, gravid, appropriate for gestational age. Pain/Pressure: Present     Pelvic:  Cervical exam performed Dilation: Fingertip Effacement (%): 50 Station: -3  Extremities: Normal range of motion.     Mental Status: Normal mood and affect. Normal behavior. Normal judgment and thought content.   Assessment   21 y.o. G3P1011 at [redacted]w[redacted]d by  12/29/2021, by Last Menstrual Period presenting for work-in prenatal visit RNST Plan   pregnancy Problems (from 05/18/21 to present)     Problem Noted Resolved   Supervision of other normal pregnancy, antepartum 05/18/2021 by Mirna Mires, CNM No   Overview Addendum 11/12/2021  9:31 AM by Mirna Mires, CNM     Nursing Staff Provider  Office Location  [facility] Dating  [redacted]w[redacted]d 06/17/21  Language  English Anatomy US  Norm   Flu Vaccine   Genetic Screen  NIPS:   TDaP vaccine    Hgb A1C or  GTT Early : Third trimester : 126  Covid    LAB RESULTS   Rhogam  NA Blood Type   A Pos  Feeding Plan Breast Antibody  neg  Contraception  Rubella  immune  Circumcision Yes RPR   NR  Pediatrician  [facility] HBsAg   neg  Support Person  HIV  NR  Prenatal Classes multip  Varicella Immune    GBS  (For PCN allergy, check sensitivities)   BTL Consent     VBAC Consent  Pap  NILM    Hgb Electro      CF      SMA         Hx of primary Cs in 2021. >9lb baby) weight gain of 80 lbs. Desires VBAC.           Term labor symptoms and general  obstetric precautions including but not limited to vaginal bleeding, contractions, leaking of fluid and fetal movement were reviewed in detail with the patient. Please refer to After Visit Summary for other counseling recommendations.   IOL Tuesday  Carie Caddy, CNM  Domingo Pulse, [facility]  12/18/21  12:45 PM

## 2021-12-19 LAB — PROTEIN / CREATININE RATIO, URINE
Creatinine, Urine: 26.5 mg/dL
Protein, Ur: 5.3 mg/dL
Protein/Creat Ratio: 200 mg/g creat (ref 0–200)

## 2021-12-22 ENCOUNTER — Inpatient Hospital Stay: Payer: Medicaid Other | Admitting: General Practice

## 2021-12-22 ENCOUNTER — Other Ambulatory Visit: Payer: Self-pay

## 2021-12-22 ENCOUNTER — Inpatient Hospital Stay
Admission: EM | Admit: 2021-12-22 | Discharge: 2021-12-24 | DRG: 807 | Disposition: A | Discharge status: Home / Self Care | Payer: Medicaid Other | Attending: Certified Nurse Midwife | Admitting: Certified Nurse Midwife

## 2021-12-22 ENCOUNTER — Encounter: Payer: Self-pay | Admitting: Obstetrics and Gynecology

## 2021-12-22 DIAGNOSIS — O34211 Maternal care for low transverse scar from previous cesarean delivery: Secondary | ICD-10-CM | POA: Diagnosis present

## 2021-12-22 DIAGNOSIS — Z3A39 39 weeks gestation of pregnancy: Secondary | ICD-10-CM | POA: Diagnosis not present

## 2021-12-22 DIAGNOSIS — O0990 Supervision of high risk pregnancy, unspecified, unspecified trimester: Secondary | ICD-10-CM

## 2021-12-22 DIAGNOSIS — J45909 Unspecified asthma, uncomplicated: Secondary | ICD-10-CM | POA: Diagnosis not present

## 2021-12-22 DIAGNOSIS — O3660X0 Maternal care for excessive fetal growth, unspecified trimester, not applicable or unspecified: Secondary | ICD-10-CM | POA: Diagnosis not present

## 2021-12-22 DIAGNOSIS — Z3490 Encounter for supervision of normal pregnancy, unspecified, unspecified trimester: Secondary | ICD-10-CM | POA: Diagnosis present

## 2021-12-22 DIAGNOSIS — O34219 Maternal care for unspecified type scar from previous cesarean delivery: Secondary | ICD-10-CM | POA: Diagnosis not present

## 2021-12-22 DIAGNOSIS — O99284 Endocrine, nutritional and metabolic diseases complicating childbirth: Secondary | ICD-10-CM | POA: Diagnosis not present

## 2021-12-22 DIAGNOSIS — E041 Nontoxic single thyroid nodule: Secondary | ICD-10-CM | POA: Diagnosis not present

## 2021-12-22 DIAGNOSIS — O3663X0 Maternal care for excessive fetal growth, third trimester, not applicable or unspecified: Secondary | ICD-10-CM | POA: Diagnosis present

## 2021-12-22 DIAGNOSIS — O09293 Supervision of pregnancy with other poor reproductive or obstetric history, third trimester: Secondary | ICD-10-CM | POA: Diagnosis not present

## 2021-12-22 DIAGNOSIS — Z3480 Encounter for supervision of other normal pregnancy, unspecified trimester: Principal | ICD-10-CM

## 2021-12-22 DIAGNOSIS — O99513 Diseases of the respiratory system complicating pregnancy, third trimester: Secondary | ICD-10-CM | POA: Diagnosis not present

## 2021-12-22 LAB — COMPREHENSIVE METABOLIC PANEL
ALT: 16 U/L (ref 0–44)
AST: 20 U/L (ref 15–41)
Albumin: 3.1 g/dL — ABNORMAL LOW (ref 3.5–5.0)
Alkaline Phosphatase: 143 U/L — ABNORMAL HIGH (ref 38–126)
Anion gap: 5 (ref 5–15)
BUN: 8 mg/dL (ref 6–20)
CO2: 23 mmol/L (ref 22–32)
Calcium: 9 mg/dL (ref 8.9–10.3)
Chloride: 109 mmol/L (ref 98–111)
Creatinine, Ser: 0.53 mg/dL (ref 0.44–1.00)
GFR, Estimated: >60 mL/min (ref >60)
Glucose, Bld: 111 mg/dL — ABNORMAL HIGH (ref 70–99)
Potassium: 3.5 mmol/L (ref 3.5–5.1)
Sodium: 137 mmol/L (ref 135–145)
Total Bilirubin: 0.6 mg/dL (ref 0.3–1.2)
Total Protein: 6 g/dL — ABNORMAL LOW (ref 6.5–8.1)

## 2021-12-22 LAB — PROTEIN / CREATININE RATIO, URINE
Creatinine, Urine: 30 mg/dL
Total Protein, Urine: <6 mg/dL

## 2021-12-22 LAB — TYPE AND SCREEN
ABO/RH(D): A POS
Antibody Screen: NEGATIVE

## 2021-12-22 LAB — CBC
HCT: 38.1 % (ref 36.0–46.0)
Hemoglobin: 12.3 g/dL (ref 12.0–15.0)
MCH: 26.7 pg (ref 26.0–34.0)
MCHC: 32.3 g/dL (ref 30.0–36.0)
MCV: 82.8 fL (ref 80.0–100.0)
Platelets: 172 10*3/uL (ref 150–400)
RBC: 4.6 MIL/uL (ref 3.87–5.11)
RDW: 15.1 % (ref 11.5–15.5)
WBC: 6.3 10*3/uL (ref 4.0–10.5)
nRBC: 0 % (ref 0.0–0.2)

## 2021-12-22 MED ORDER — LACTATED RINGERS IV SOLN
500.0000 mL | INTRAVENOUS | Status: DC | PRN
  Administered 2021-12-23: 250 mL via INTRAVENOUS

## 2021-12-22 MED ORDER — ONDANSETRON HCL 4 MG/2ML IJ SOLN
4.0000 mg | Freq: Four times a day (QID) | INTRAMUSCULAR | Status: DC | PRN
  Administered 2021-12-23: 4 mg via INTRAVENOUS
  Filled 2021-12-22: qty 2

## 2021-12-22 MED ORDER — TERBUTALINE SULFATE 1 MG/ML IJ SOLN: 0.2500 mg | Freq: Once | INTRAMUSCULAR | Status: DC | PRN

## 2021-12-22 MED ORDER — OXYTOCIN-SODIUM CHLORIDE 30-0.9 UT/500ML-% IV SOLN
2.5000 [IU]/h | INTRAVENOUS | Status: DC
  Filled 2021-12-22 (×2): qty 500

## 2021-12-22 MED ORDER — PHENYLEPHRINE 80 MCG/ML (10ML) SYRINGE FOR IV PUSH (FOR BLOOD PRESSURE SUPPORT): 80.0000 ug | PREFILLED_SYRINGE | INTRAVENOUS | Status: DC | PRN

## 2021-12-22 MED ORDER — MISOPROSTOL 200 MCG PO TABS
ORAL_TABLET | ORAL | Status: AC
  Filled 2021-12-22: qty 4

## 2021-12-22 MED ORDER — SOD CITRATE-CITRIC ACID 500-334 MG/5ML PO SOLN: 30.0000 mL | ORAL | Status: DC | PRN

## 2021-12-22 MED ORDER — LACTATED RINGERS IV SOLN: INTRAVENOUS | Status: DC

## 2021-12-22 MED ORDER — FLUOXETINE HCL 20 MG PO CAPS
60.0000 mg | ORAL_CAPSULE | Freq: Every day | ORAL | Status: DC
  Administered 2021-12-22 – 2021-12-24 (×3): 60 mg via ORAL
  Filled 2021-12-22 (×3): qty 3

## 2021-12-22 MED ORDER — OXYTOCIN BOLUS FROM INFUSION
333.0000 mL | Freq: Once | INTRAVENOUS | Status: AC
  Administered 2021-12-23: 333 mL via INTRAVENOUS

## 2021-12-22 MED ORDER — FENTANYL-BUPIVACAINE-NACL 0.5-0.125-0.9 MG/250ML-% EP SOLN
12.0000 mL/h | EPIDURAL | Status: DC | PRN
  Administered 2021-12-22: 12 mL/h via EPIDURAL
  Filled 2021-12-22: qty 250

## 2021-12-22 MED ORDER — EPHEDRINE 5 MG/ML INJ: 10.0000 mg | INTRAVENOUS | Status: DC | PRN

## 2021-12-22 MED ORDER — OXYTOCIN-SODIUM CHLORIDE 30-0.9 UT/500ML-% IV SOLN
1.0000 m[IU]/min | INTRAVENOUS | Status: DC
  Administered 2021-12-22: 2 m[IU]/min via INTRAVENOUS
  Administered 2021-12-22: 4 m[IU]/min via INTRAVENOUS

## 2021-12-22 MED ORDER — DIPHENHYDRAMINE HCL 50 MG/ML IJ SOLN: 12.5000 mg | INTRAMUSCULAR | Status: DC | PRN

## 2021-12-22 MED ORDER — OXYTOCIN 10 UNIT/ML IJ SOLN
INTRAMUSCULAR | Status: AC
  Filled 2021-12-22: qty 2

## 2021-12-22 MED ORDER — LIDOCAINE HCL (PF) 1 % IJ SOLN
30.0000 mL | INTRAMUSCULAR | Status: AC | PRN
  Administered 2021-12-23: 30 mL via SUBCUTANEOUS
  Filled 2021-12-22: qty 30

## 2021-12-22 MED ORDER — LACTATED RINGERS IV SOLN
500.0000 mL | Freq: Once | INTRAVENOUS | Status: AC
  Administered 2021-12-22: 500 mL via INTRAVENOUS

## 2021-12-22 MED ORDER — AMMONIA AROMATIC IN INHA
RESPIRATORY_TRACT | Status: AC
  Filled 2021-12-22: qty 10

## 2021-12-22 NOTE — Progress Notes (Signed)
   Subjective:  Breathing through contractions. Agreeable to AROM   Objective:   Vitals: Blood pressure 130/63, pulse 86, temperature 98.1 F (36.7 C), temperature source Oral, resp. rate 18, height 5\' 7"  (1.702 m), weight 99 kg, last menstrual period 03/24/2021. General: NAD Abdomen:non tender Cervical Exam:  Dilation: 4 Effacement (%): 40 Cervical Position: Posterior Station: -2 Presentation: Vertex Exam by:: Esperanza Madrazo CNM  FHT: baseline 145, moderate variability, pos accel, occasional variable Toco:q 2-4 Pitocin at 14 milli-units/hour   Results for orders placed or performed during the hospital encounter of 12/22/21 (from the past 24 hour(s))  CBC     Status: None   Collection Time: 12/22/21  8:47 AM  Result Value Ref Range   WBC 6.3 4.0 - 10.5 K/uL   RBC 4.60 3.87 - 5.11 MIL/uL   Hemoglobin 12.3 12.0 - 15.0 g/dL   HCT 12/24/21 78.6 - 76.7 %   MCV 82.8 80.0 - 100.0 fL   MCH 26.7 26.0 - 34.0 pg   MCHC 32.3 30.0 - 36.0 g/dL   RDW 20.9 47.0 - 96.2 %   Platelets 172 150 - 400 K/uL   nRBC 0.0 0.0 - 0.2 %  Type and screen     Status: None   Collection Time: 12/22/21  8:47 AM  Result Value Ref Range   ABO/RH(D) A POS    Antibody Screen NEG    Sample Expiration      12/25/2021,2359 Performed at [facility], [address] [place], [place]   Protein / creatinine ratio, urine     Status: None   Collection Time: 12/22/21  8:47 AM  Result Value Ref Range   Creatinine, Urine 30 mg/dL   Total Protein, Urine <6 mg/dL   Protein Creatinine Ratio        0.00 - 0.15 mg/mg[Cre]  Comprehensive metabolic panel     Status: Abnormal   Collection Time: 12/22/21  8:47 AM  Result Value Ref Range   Sodium 137 135 - 145 mmol/L   Potassium 3.5 3.5 - 5.1 mmol/L   Chloride 109 98 - 111 mmol/L   CO2 23 22 - 32 mmol/L   Glucose, Bld 111 (H) 70 - 99 mg/dL   BUN 8 6 - 20 mg/dL   Creatinine, Ser 12/24/21 0.44 - 1.00 mg/dL   Calcium 9.0 8.9 - 6.54 mg/dL   Total Protein 6.0 (L)  6.5 - 8.1 g/dL   Albumin 3.1 (L) 3.5 - 5.0 g/dL   AST 20 15 - 41 U/L   ALT 16 0 - 44 U/L   Alkaline Phosphatase 143 (H) 38 - 126 U/L   Total Bilirubin 0.6 0.3 - 1.2 mg/dL   GFR, Estimated 65.0 >35 mL/min   Anion gap 5 5 - 15    Assessment:   21 y.o. G3P1011 105w0d admitted for IOL for LGA   Plan:  1) Labor -FB out, pitocin infusing. AROM for mec 1715 .       TOLAC: continuous monitoring       NICU at birth    2) Fetus - category 2 tracing   3) ID: GBS neg, AROM for mec at 1715   4) Pain management: aware of all options, will ask once desired   5) Hx of PPD on Prozac, med ordered, watch mood PP.   [redacted]w[redacted]d, CNM  Carie Caddy, [facility]  @TODAY @  5:24 PM

## 2021-12-22 NOTE — Progress Notes (Signed)
   Subjective:  Pt more uncomfortable, desires pain medication.   Objective:   Vitals: Blood pressure 134/77, pulse (!) 115, temperature 98.8 F (37.1 C), temperature source Oral, resp. rate 19, height 5\' 7"  (1.702 m), weight 99 kg, last menstrual period 03/24/2021, SpO2 100 %. General: NAD Abdomen:non tender Cervical Exam:  Dilation: 4 Effacement (%): 40 Cervical Position: Posterior Station: -2 Presentation: Vertex Exam by:: Sary Bogie  CNM  FHT: baseline 145, moderate viability, pos accel, isolated variable. Toco:q 2-3, moderate to strong, soft resting tone Pitocin at 8 milliunits per hour (recently halved d./ tachysystole)  Results for orders placed or performed during the hospital encounter of 12/22/21 (from the past 24 hour(s))  CBC     Status: None   Collection Time: 12/22/21  8:47 AM  Result Value Ref Range   WBC 6.3 4.0 - 10.5 K/uL   RBC 4.60 3.87 - 5.11 MIL/uL   Hemoglobin 12.3 12.0 - 15.0 g/dL   HCT 12/24/21 16.1 - 09.6 %   MCV 82.8 80.0 - 100.0 fL   MCH 26.7 26.0 - 34.0 pg   MCHC 32.3 30.0 - 36.0 g/dL   RDW 04.5 40.9 - 81.1 %   Platelets 172 150 - 400 K/uL   nRBC 0.0 0.0 - 0.2 %  Type and screen     Status: None   Collection Time: 12/22/21  8:47 AM  Result Value Ref Range   ABO/RH(D) A POS    Antibody Screen NEG    Sample Expiration      12/25/2021,2359 Performed at [facility], [address] [place], [place]   Protein / creatinine ratio, urine     Status: None   Collection Time: 12/22/21  8:47 AM  Result Value Ref Range   Creatinine, Urine 30 mg/dL   Total Protein, Urine <6 mg/dL   Protein Creatinine Ratio        0.00 - 0.15 mg/mg[Cre]  Comprehensive metabolic panel     Status: Abnormal   Collection Time: 12/22/21  8:47 AM  Result Value Ref Range   Sodium 137 135 - 145 mmol/L   Potassium 3.5 3.5 - 5.1 mmol/L   Chloride 109 98 - 111 mmol/L   CO2 23 22 - 32 mmol/L   Glucose, Bld 111 (H) 70 - 99 mg/dL   BUN 8 6 - 20 mg/dL    Creatinine, Ser 12/24/21 0.44 - 1.00 mg/dL   Calcium 9.0 8.9 - 6.21 mg/dL   Total Protein 6.0 (L) 6.5 - 8.1 g/dL   Albumin 3.1 (L) 3.5 - 5.0 g/dL   AST 20 15 - 41 U/L   ALT 16 0 - 44 U/L   Alkaline Phosphatase 143 (H) 38 - 126 U/L   Total Bilirubin 0.6 0.3 - 1.2 mg/dL   GFR, Estimated 30.8 >65 mL/min   Anion gap 5 5 - 15    Assessment:   21 y.o. G3P1011 [redacted]w[redacted]d admitted for IOL for LGA  Plan:   1) Labor -in early labor, 4 cm since 1540, consider IUPC after next VE in 2-4 hours   2) Fetus - category 2 tracing   3) ID: GBS neg, AROM for mec at 1715   4) Pain management: aware of all options, will ask once desired   5) Hx of PPD on Prozac, med ordered, watch mood PP.   [redacted]w[redacted]d, CNM  Carie Caddy, [facility]  @TODAY @  8:13 PM

## 2021-12-22 NOTE — Progress Notes (Signed)
Pt arrived for scheduled TOLAC IOL.

## 2021-12-22 NOTE — Anesthesia Procedure Notes (Signed)
Epidural Patient location during procedure: OB Start time: 12/22/2021 8:25 PM End time: 12/22/2021 8:45 PM  Staffing Anesthesiologist: Stephanie Coup, MD Performed: anesthesiologist   Preanesthetic Checklist Completed: patient identified, IV checked, site marked, risks and benefits discussed, surgical consent, monitors and equipment checked, pre-op evaluation and timeout performed  Epidural Patient position: sitting Prep: Betadine Patient monitoring: heart rate, continuous pulse ox and blood pressure Approach: midline Location: L3-L4 Injection technique: LOR saline  Needle:  Needle type: Tuohy  Needle gauge: 18 G Needle length: 9 cm and 9 Needle insertion depth: 5 cm Catheter type: closed end flexible Catheter size: 20 Guage Catheter at skin depth: 10 cm Test dose: negative and 1.5% lidocaine with Epi 1:200 K  Assessment Sensory level: T4 Events: blood not aspirated, injection not painful, no injection resistance, no paresthesia and negative IV test  Additional Notes   Patient tolerated the insertion well without complications.Reason for block:procedure for pain

## 2021-12-22 NOTE — H&P (Signed)
OB History & Physical   History of Present Illness:  Chief Complaint:   HPI:  Sara Henson is a 21 y.o. G17P1011 female at [redacted]w[redacted]d dated by L and 12wk [place].  She presents to L&D for induction of labor secondary to LGA fetus. She was seen by MFM on 8/14 for S> D, an [place] at that time showed an EFW 9lbs 1 oz in the 99%. It was recommended to her by MFM to be induced at 39 wks.  The pt had a Primary LTCS  for suspected CPD in 2021.  Risks/benefits of TOLAC were reviewed by a Provider in June.  Aeron has has had early and regular prenatal care. She has had a 38 pound weight gain, her 1 hour GDM screen was 126, on 7/28 she had a large amount of glucose in her urine, a random glucose was 125 with a HA1C of 5.4 Pt has hx of GHTN, she had has 1 elevated blood pressure in this pregnancy with normal labs.  Pt endorses +FM, has had few contractions, denies any LOF/VG, denies headaches or visual disturbances. Has had a little pain near her right rib, she feels it is MSK related.    Pregnancy Issues: 1. 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 Primary LTCS 09/28/2019 3. LGA fetus  4. History of eating disorder 5. Thyroid nodule, subclinical hypothyroid  6. 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 Authorizing Provider  FLUoxetine (PROZAC) 20 MG capsule Take 60 mg by mouth every morning. 08/13/21   [provider]  loratadine (CLARITIN) 10 MG tablet Take 1 tablet by mouth daily.    [provider]  Prenatal Vit-Fe Fumarate-FA (PRENATAL MULTIVITAMIN) TABS tablet Take 1 tablet by mouth daily at 12 noon.    [provider]     Prenatal care site:[facility]   Social History: She  reports that she has never smoked. She has quit using smokeless tobacco.  Her smokeless tobacco use included chew. She reports that she does not currently use alcohol. She reports that she does not use drugs.  Family History: family history includes Skin cancer in her father.   Review of Systems: A full review of systems was performed and negative except as noted in the HPI.     Physical Exam:  Vital Signs: BP 133/75 (BP Location: Left Arm)   Pulse 98   Temp 98.7 F (37.1 C) (Oral)   Resp 18   LMP 03/24/2021  General: no acute distress.  HEENT: normocephalic, atraumatic Heart: regular rate & rhythm.  No murmurs/rubs/gallops Lungs: clear to auscultation bilaterally, normal respiratory effort Abdomen: soft, gravid, non-tender;  EFW: 9lbs Pelvic:   External: Normal external female genitalia  Cervix: Dilation: Fingertip / Effacement (%): 40 / Station: -  3    Extremities: non-tender, symmetric, trace edema bilaterally.  DTRs: +2  Neurologic: Alert & oriented x 3.    Results for orders placed or performed during the hospital encounter of 12/22/21 (from the past 24 hour(s))  CBC     Status: None   Collection Time: 12/22/21  8:47 AM  Result Value Ref Range   WBC 6.3 4.0 - 10.5 K/uL   RBC 4.60 3.87 - 5.11 MIL/uL   Hemoglobin 12.3 12.0 - 15.0 g/dL   HCT 12.7 51.7 - 00.1 %   MCV 82.8 80.0 - 100.0 fL   MCH 26.7 26.0 - 34.0 pg   MCHC 32.3 30.0 - 36.0 g/dL   RDW 74.9 44.9 - 67.5 %   Platelets 172 150 - 400 K/uL   nRBC 0.0 0.0 - 0.2 %  Comprehensive metabolic panel      Status: Abnormal   Collection Time: 12/22/21  8:47 AM  Result Value Ref Range   Sodium 137 135 - 145 mmol/L   Potassium 3.5 3.5 - 5.1 mmol/L   Chloride 109 98 - 111 mmol/L   CO2 23 22 - 32 mmol/L   Glucose, Bld 111 (H) 70 - 99 mg/dL   BUN 8 6 - 20 mg/dL   Creatinine, Ser 9.16 0.44 - 1.00 mg/dL   Calcium 9.0 8.9 - 38.4 mg/dL   Total Protein 6.0 (L) 6.5 - 8.1 g/dL   Albumin 3.1 (L) 3.5 - 5.0 g/dL   AST 20 15 - 41 U/L   ALT 16 0 - 44 U/L   Alkaline Phosphatase 143 (H) 38 - 126 U/L   Total Bilirubin 0.6 0.3 - 1.2 mg/dL   GFR, Estimated >66 >59 mL/min   Anion gap 5 5 - 15    Pertinent Results:  Prenatal Labs: Blood type/Rh A positive   Antibody screen neg  Rubella Immune  Varicella Immune  RPR NR  HBsAg Neg  HIV NR  GC neg  Chlamydia neg  Genetic screening negative  1 hour GTT 126  3 hour GTT   GBS Negative    FHT: baseline 145, moderate variability, pos accel, neg decel  TOCO: irregular  SVE:  Dilation: Fingertip / Effacement (%): 40 / Station: -3    Cephalic by leopolds and BSUS   [place] MFM OB DETAIL +14 WK  Result Date: 12/14/2021 ----------------------------------------------------------------------  OBSTETRICS REPORT                       (Signed Final 12/14/2021 10:22 pm) ---------------------------------------------------------------------- Patient Info  ID #:       [id number]                          D.O.B.:  Oct 23, 2000 (21 yrs)  Name:       Sara Henson                     Visit Date: 12/14/2021 07:37 am ---------------------------------------------------------------------- Performed By  Attending:        Ma Rings MD         Ref. Address:      [address]  [place], [place]                                                              [address]  Performed By:     Marcellina Millin       [facility]  Referred By:      Allie Bossier MD ---------------------------------------------------------------------- Orders  #  Description                           Code        Ordered By  1  [place] MFM OB DETAIL +14 WK               76811.01    MYRA DOVE ----------------------------------------------------------------------  #  Order #                     Accession #                Episode #  1  [id number]                   [id number]                 [id number] ---------------------------------------------------------------------- Indications  Uterine size-date discrepancy, third trimester  O26.843  Suspected macroscopic fetus                     O36.60X0  Poor obstetric history: Prior fetal             O09.299  macrosomia, antepartum  Previous cesarean delivery, antepartum          O34.219  Encounter for antenatal screening for           Z36.3  malformations  [redacted] weeks gestation of pregnancy                 Z3A.37  LR NIPS - Female ---------------------------------------------------------------------- Vital Signs  BP:          134/68 ---------------------------------------------------------------------- Fetal Evaluation  Num Of Fetuses:          1  Fetal Heart  Rate(bpm):   157  Cardiac Activity:        Observed  Presentation:            Cephalic  Placenta:                Anterior  P. Cord Insertion:       Visualized, central  Amniotic Fluid  AFI FV:      Within normal limits  AFI Sum(cm)     %Tile       Largest Pocket(cm)  14.67           56          7.74  RUQ(cm)       RLQ(cm)       LUQ(cm)        LLQ(cm)  3.01          1.14          7.74           2.78 ---------------------------------------------------------------------- Biophysical Evaluation  Amniotic F.V:   Pocket => 2 cm             F. Tone:         Observed  F. Movement:    Observed                   Score:           8/8  F. Breathing:   Observed  ---------------------------------------------------------------------- Biometry  BPD:     91.46  mm     G. Age:  37w 1d         52  %    CI:            74  %    70 - 86                                                          FL/HC:       21.6  %    20.9 - 22.7  HC:    337.64   mm     G. Age:  38w 5d         50  %    HC/AC:       0.87       0.92 - 1.05  AC:    387.74   mm     G. Age:  42w 5d       > 99  %    FL/BPD:      79.7  %    71 - 87  FL:      72.86  mm     G. Age:  37w 2d         37  %    FL/AC:       18.8  %    20 - 24  HUM:      62.3  mm     G. Age:  36w 1d         44  %  Est. FW:    4105   gm     9 lb 1 oz     99  % ---------------------------------------------------------------------- OB History  Gravidity:    3         Term:   1  Prem:   0        SAB:   1  TOP:          0       Ectopic:  0        Living: 1 ---------------------------------------------------------------------- Gestational Age  LMP:           37w 6d        Date:  03/24/21                 EDD:   12/29/21  U/S Today:     39w 0d                                        EDD:   12/21/21  Best:          37w 6d     Det. By:  LMP  (03/24/21)          EDD:   12/29/21 ---------------------------------------------------------------------- Anatomy  Cranium:               Appears normal         LVOT:                   Not well visualized  Cavum:                 Appears normal         Aortic Arch:            Not well visualized  Ventricles:            Not well visualized    Ductal Arch:            Not well visualized  Choroid Plexus:        Not well visualized    Diaphragm:              Appears normal  Cerebellum:            Not well visualized    Stomach:                Appears normal, left                                                                        sided  Posterior Fossa:       Not well visualized    Abdomen:                Appears normal  Nuchal Fold:           Not applicable (>20    Abdominal Wall:         Not well visualized                          wks GA)  Face:                  Appears normal         Cord Vessels:           Appears normal (3                         (  orbits and profile)                           vessel cord)  Lips:                  Appears normal         Kidneys:                Appear normal  Palate:                Not well visualized    Bladder:                Appears normal  Thoracic:              Appears normal         Spine:                  Not well visualized  Heart:                 Appears normal         Upper Extremities:      Lt visualized, Rt not                         (4CH, axis, and                                well vis                         situs)  RVOT:                  Not well visualized    Lower Extremities:      Lt visualized, Rt not                                                                        well vis  Other:  Fetus appears to be a female. Nasal bone, lenses visualized.          Technically difficult due to advanced gestational age. ---------------------------------------------------------------------- Cervix Uterus Adnexa  Cervix  Not visualized (advanced GA >24wks) ---------------------------------------------------------------------- Comments  This patient was seen as her fundal heights have been  measuring larger than her gestational age. The patient  reports that her fundal height measured about 3 to 4 weeks  ahead during her most recent prenatal visit.  She has  screened negative for gestational diabetes in her current  pregnancy.  She has a prior cesarean delivery due to arrest of dilatation of  a macrosomic infant weighing 9 pounds 11 ounces at 40  weeks and 6 days.  On today's exam, the overall EFW 9 pounds 1 ounces  measures at the 99th percentile for her gestational age.  There was normal amniotic fluid noted with a total AFI of  14.67 cm.  A BPP performed today was 8 out of 8.  The views of the fetal anatomy were limited today due to her  advanced gestational age.  Ms Stoermer has stated  that she would like to have 8  or more  children.  Therefore, she would like to attempt a  TOLAC/VBAC if possible.  The inherent errors of estimation of fetal weight at term was  discussed.  Due to the large for gestational age fetus noted today, it may  be reasonable for her to undergo an induction of labor at 39  weeks (next week) in order to increase her chances of a  successful vaginal birth.  The patient will discuss scheduling an induction for next week  with you during her next prenatal visit. ----------------------------------------------------------------------                   Ma Rings, MD Electronically Signed Final Report   12/14/2021 10:22 pm ----------------------------------------------------------------------   Assessment:  Vickki Hearing is a 21 y.o. G41P1011 female at [redacted]w[redacted]d with IOL for LGA fetus.   Plan:  Admit to Labor & Delivery CBC, T&S, regular, IVF GBS  negative  Consents obtained. TOLAC consent signed, understand the risks of uterine abruption, possible arrest of labor/descent  labor requiring needs  for RLTCS, or potential shoulder dystocia.  Continuous efm/toco Labor: foley bulb placed, will start pitocin  Dr Logan Bores aware of admission and plan   ----- Carie Caddy, CNM  [facility] Even[facility] [place]

## 2021-12-22 NOTE — Anesthesia Preprocedure Evaluation (Signed)
Anesthesia Evaluation  Patient identified by MRN, date of birth, ID band Patient awake    Reviewed: Allergy & Precautions, NPO status , Patient's Chart, lab work & pertinent test results  Airway Mallampati: II  TM Distance: >3 FB Neck ROM: full    Dental  (+) Teeth Intact   Pulmonary neg pulmonary ROS, neg shortness of breath, neg recent URI,    Pulmonary exam normal        Cardiovascular Exercise Tolerance: Good (-) Past MI and (-) CABG negative cardio ROS Normal cardiovascular exam     Neuro/Psych    GI/Hepatic negative GI ROS,   Endo/Other  Hypothyroidism   Renal/GU   negative genitourinary   Musculoskeletal   Abdominal   Peds  Hematology negative hematology ROS (+)   Anesthesia Other Findings Past Medical History: No date: Asthma 08/14/2019: Elevated blood pressure affecting pregnancy in third  trimester, antepartum No date: GERD (gastroesophageal reflux disease) 09/01/2021: Postpartum depression     Comment:  Had inpatient treatment at [facility] Sees perinatal pysch  Past Surgical History: 09/28/2019: CESAREAN SECTION; N/A     Comment:  Procedure: CESAREAN SECTION;  Surgeon: Vena Austria, MD;  Location: [facility] ORS;  Service: Obstetrics;                Laterality: N/A;  UXN:ATFTDD [id number] apgar-8,9 weight              9lbs 11oz No date: COLPOSCOPY No date: TONSILLECTOMY No date: TYMPANOSTOMY TUBE PLACEMENT  BMI    Body Mass Index: 34.18 kg/m      Reproductive/Obstetrics (+) Pregnancy                             Anesthesia Physical Anesthesia Plan  ASA: 2  Anesthesia Plan: Epidural   Post-op Pain Management:    Induction:   PONV Risk Score and Plan:   Airway Management Planned: Natural Airway  Additional Equipment:   Intra-op Plan:   Post-operative Plan:   Informed Consent: I have reviewed the patients History and Physical, chart, labs and  discussed the procedure including the risks, benefits and alternatives for the proposed anesthesia with the patient or authorized representative who has indicated his/her understanding and acceptance.     Dental Advisory Given  Plan Discussed with: Anesthesiologist  Anesthesia Plan Comments: (Patient reports no bleeding problems and no anticoagulant use.   Patient consented for risks of anesthesia including but not limited to:  - adverse reactions to medications - risk of bleeding, infection and or nerve damage from epidural that could lead to paralysis - risk of headache or failed epidural - nerve damage due to positioning - that if epidural is used for C-section that there is a chance of epidural failure requiring spinal placement or conversion to GA - Damage to heart, brain, lungs, other parts of body or loss of life  Patient voiced understanding.)        Anesthesia Quick Evaluation

## 2021-12-22 NOTE — Progress Notes (Signed)
   Subjective:  Starting to breath through contractions.  Desires to walk around for a while prior to AROM.   Objective:   Vitals: Blood pressure (!) 113/57, pulse 85, temperature 98.1 F (36.7 C), temperature source Oral, resp. rate 18, last menstrual period 03/24/2021. General: NAD Abdomen:non tender  Cervical Exam:  Dilation: 4 Effacement (%): 40 Cervical Position: Posterior Station: -2 Presentation: Vertex Exam by:: Mckynzi Cammon CNM, membranes swept during exam, bloody show present   FHT: baseline 145, moderate variability, pos accel, neg dcel  Toco:q 1-3 Pitocin @ 12 milli-units.hour  Results for orders placed or performed during the hospital encounter of 12/22/21 (from the past 24 hour(s))  CBC     Status: None   Collection Time: 12/22/21  8:47 AM  Result Value Ref Range   WBC 6.3 4.0 - 10.5 K/uL   RBC 4.60 3.87 - 5.11 MIL/uL   Hemoglobin 12.3 12.0 - 15.0 g/dL   HCT 85.8 85.0 - 27.7 %   MCV 82.8 80.0 - 100.0 fL   MCH 26.7 26.0 - 34.0 pg   MCHC 32.3 30.0 - 36.0 g/dL   RDW 41.2 87.8 - 67.6 %   Platelets 172 150 - 400 K/uL   nRBC 0.0 0.0 - 0.2 %  Type and screen     Status: None   Collection Time: 12/22/21  8:47 AM  Result Value Ref Range   ABO/RH(D) A POS    Antibody Screen NEG    Sample Expiration      12/25/2021,2359 Performed at [facility], [address] [place], [place] [address]   Protein / creatinine ratio, urine     Status: None   Collection Time: 12/22/21  8:47 AM  Result Value Ref Range   Creatinine, Urine 30 mg/dL   Total Protein, Urine <6 mg/dL   Protein Creatinine Ratio        0.00 - 0.15 mg/mg[Cre]  Comprehensive metabolic panel     Status: Abnormal   Collection Time: 12/22/21  8:47 AM  Result Value Ref Range   Sodium 137 135 - 145 mmol/L   Potassium 3.5 3.5 - 5.1 mmol/L   Chloride 109 98 - 111 mmol/L   CO2 23 22 - 32 mmol/L   Glucose, Bld 111 (H) 70 - 99 mg/dL   BUN 8 6 - 20 mg/dL   Creatinine, Ser 7.09 0.44 - 1.00 mg/dL   Calcium  9.0 8.9 - 62.8 mg/dL   Total Protein 6.0 (L) 6.5 - 8.1 g/dL   Albumin 3.1 (L) 3.5 - 5.0 g/dL   AST 20 15 - 41 U/L   ALT 16 0 - 44 U/L   Alkaline Phosphatase 143 (H) 38 - 126 U/L   Total Bilirubin 0.6 0.3 - 1.2 mg/dL   GFR, Estimated >36 >62 mL/min   Anion gap 5 5 - 15    Assessment:   21 y.o. G3P1011 [redacted]w[redacted]d admitted for IOL for LGA fetus   Plan:    1) Labor -FB out, pitocin infusing. Pt to ambulate x hours then consider AROM.       TOLAC: continuous monitoring    2) Fetus - category 1 tracing   3) ID: GBS neg, membranes intact   4) Pain management: aware of all options, will ask once desired   5) Hx of PPD on Prozac, med ordered, watch mood PP.   Carie Caddy, CNM  [facility], [facility]  @TODAY @  3:47 PM

## 2021-12-22 NOTE — Progress Notes (Signed)
   Subjective:  Starting to feel contractions. Desires to walk around for a while  Objective:   Vitals: Blood pressure (!) 113/57, pulse 85, temperature 98.7 F (37.1 C), temperature source Oral, resp. rate 16, last menstrual period 03/24/2021. General: NAD Abdomen:non tender  Cervical Exam:  Dilation: Fingertip Effacement (%): 40 Cervical Position: Posterior Station: -3 Presentation: Vertex Exam by:: Shadi Larner CNM  FHT: baseline 145, moderate variability, pos accel, neg decel  Toco:q 1-3 Pitocin at 4 milli-units/ min   Results for orders placed or performed during the hospital encounter of 12/22/21 (from the past 24 hour(s))  CBC     Status: None   Collection Time: 12/22/21  8:47 AM  Result Value Ref Range   WBC 6.3 4.0 - 10.5 K/uL   RBC 4.60 3.87 - 5.11 MIL/uL   Hemoglobin 12.3 12.0 - 15.0 g/dL   HCT 37.6 28.3 - 15.1 %   MCV 82.8 80.0 - 100.0 fL   MCH 26.7 26.0 - 34.0 pg   MCHC 32.3 30.0 - 36.0 g/dL   RDW 76.1 60.7 - 37.1 %   Platelets 172 150 - 400 K/uL   nRBC 0.0 0.0 - 0.2 %  Type and screen     Status: None   Collection Time: 12/22/21  8:47 AM  Result Value Ref Range   ABO/RH(D) A POS    Antibody Screen NEG    Sample Expiration      12/25/2021,2359 Performed at [facility], [address] [place], [place] [address]   Protein / creatinine ratio, urine     Status: None   Collection Time: 12/22/21  8:47 AM  Result Value Ref Range   Creatinine, Urine 30 mg/dL   Total Protein, Urine <6 mg/dL   Protein Creatinine Ratio        0.00 - 0.15 mg/mg[Cre]  Comprehensive metabolic panel     Status: Abnormal   Collection Time: 12/22/21  8:47 AM  Result Value Ref Range   Sodium 137 135 - 145 mmol/L   Potassium 3.5 3.5 - 5.1 mmol/L   Chloride 109 98 - 111 mmol/L   CO2 23 22 - 32 mmol/L   Glucose, Bld 111 (H) 70 - 99 mg/dL   BUN 8 6 - 20 mg/dL   Creatinine, Ser 4.85 0.44 - 1.00 mg/dL   Calcium 9.0 8.9 - 46.2 mg/dL   Total Protein 6.0 (L) 6.5 - 8.1 g/dL    Albumin 3.1 (L) 3.5 - 5.0 g/dL   AST 20 15 - 41 U/L   ALT 16 0 - 44 U/L   Alkaline Phosphatase 143 (H) 38 - 126 U/L   Total Bilirubin 0.6 0.3 - 1.2 mg/dL   GFR, Estimated >70 >35 mL/min   Anion gap 5 5 - 15    Assessment:   21 y.o. G3P1011 [redacted]w[redacted]d admitted for IOL for LGA   Plan:   1) Labor -FB present, pitocin infusing. Consider AROM once FB out.       TOLAC: continuous monitoring   2) Fetus - category 1 tracing  3) ID: GBS neg, membranes intact  4) Pain management: aware of all options, will ask once desired  5) Hx of PPD on Prozac, med ordered, watch mood PP.   Carie Caddy, CNM  [facility], [facility]  @TODAY @  2:20 PM

## 2021-12-23 ENCOUNTER — Encounter: Payer: Self-pay | Admitting: Obstetrics and Gynecology

## 2021-12-23 DIAGNOSIS — O09293 Supervision of pregnancy with other poor reproductive or obstetric history, third trimester: Secondary | ICD-10-CM

## 2021-12-23 DIAGNOSIS — O99284 Endocrine, nutritional and metabolic diseases complicating childbirth: Secondary | ICD-10-CM

## 2021-12-23 DIAGNOSIS — O3660X0 Maternal care for excessive fetal growth, unspecified trimester, not applicable or unspecified: Secondary | ICD-10-CM

## 2021-12-23 DIAGNOSIS — O34219 Maternal care for unspecified type scar from previous cesarean delivery: Secondary | ICD-10-CM

## 2021-12-23 DIAGNOSIS — Z3A39 39 weeks gestation of pregnancy: Secondary | ICD-10-CM

## 2021-12-23 DIAGNOSIS — E041 Nontoxic single thyroid nodule: Secondary | ICD-10-CM

## 2021-12-23 DIAGNOSIS — O99513 Diseases of the respiratory system complicating pregnancy, third trimester: Secondary | ICD-10-CM

## 2021-12-23 DIAGNOSIS — J45909 Unspecified asthma, uncomplicated: Secondary | ICD-10-CM

## 2021-12-23 LAB — RPR: RPR Ser Ql: NONREACTIVE

## 2021-12-23 MED ORDER — PRENATAL MULTIVITAMIN CH
1.0000 | ORAL_TABLET | Freq: Every day | ORAL | Status: DC
  Administered 2021-12-23 – 2021-12-24 (×2): 1 via ORAL
  Filled 2021-12-23 (×2): qty 1

## 2021-12-23 MED ORDER — TRANEXAMIC ACID-NACL 1000-0.7 MG/100ML-% IV SOLN
INTRAVENOUS | Status: AC
  Administered 2021-12-23: 1000 mg
  Filled 2021-12-23: qty 100

## 2021-12-23 MED ORDER — SIMETHICONE 80 MG PO CHEW: 80.0000 mg | CHEWABLE_TABLET | ORAL | Status: DC | PRN

## 2021-12-23 MED ORDER — ONDANSETRON HCL 4 MG PO TABS: 4.0000 mg | ORAL_TABLET | ORAL | Status: DC | PRN

## 2021-12-23 MED ORDER — BENZOCAINE-MENTHOL 20-0.5 % EX AERO
INHALATION_SPRAY | CUTANEOUS | Status: AC
  Filled 2021-12-23: qty 56

## 2021-12-23 MED ORDER — IBUPROFEN 600 MG PO TABS
600.0000 mg | ORAL_TABLET | Freq: Four times a day (QID) | ORAL | Status: DC
  Administered 2021-12-23 – 2021-12-24 (×4): 600 mg via ORAL
  Filled 2021-12-23 (×4): qty 1

## 2021-12-23 MED ORDER — BENZOCAINE-MENTHOL 20-0.5 % EX AERO
1.0000 | INHALATION_SPRAY | CUTANEOUS | Status: DC | PRN
  Administered 2021-12-24: 1 via TOPICAL
  Filled 2021-12-23: qty 56

## 2021-12-23 MED ORDER — METHYLERGONOVINE MALEATE 0.2 MG PO TABS: 0.2000 mg | ORAL_TABLET | ORAL | Status: DC | PRN

## 2021-12-23 MED ORDER — METHYLERGONOVINE MALEATE 0.2 MG/ML IJ SOLN
INTRAMUSCULAR | Status: AC
  Filled 2021-12-23: qty 1

## 2021-12-23 MED ORDER — COCONUT OIL OIL: 1.0000 | TOPICAL_OIL | Status: DC | PRN

## 2021-12-23 MED ORDER — DOCUSATE SODIUM 100 MG PO CAPS
100.0000 mg | ORAL_CAPSULE | Freq: Two times a day (BID) | ORAL | Status: DC
  Administered 2021-12-24: 100 mg via ORAL
  Filled 2021-12-23: qty 1

## 2021-12-23 MED ORDER — FERROUS SULFATE 325 (65 FE) MG PO TABS
325.0000 mg | ORAL_TABLET | Freq: Every day | ORAL | Status: DC
  Administered 2021-12-24: 325 mg via ORAL
  Filled 2021-12-23: qty 1

## 2021-12-23 MED ORDER — WITCH HAZEL-GLYCERIN EX PADS
1.0000 | MEDICATED_PAD | CUTANEOUS | Status: DC | PRN
  Administered 2021-12-24: 1 via TOPICAL
  Filled 2021-12-23 (×2): qty 100

## 2021-12-23 MED ORDER — METHYLERGONOVINE MALEATE 0.2 MG/ML IJ SOLN: 0.2000 mg | INTRAMUSCULAR | Status: DC | PRN

## 2021-12-23 MED ORDER — OXYCODONE-ACETAMINOPHEN 5-325 MG PO TABS: 2.0000 | ORAL_TABLET | ORAL | Status: DC | PRN

## 2021-12-23 MED ORDER — SENNOSIDES-DOCUSATE SODIUM 8.6-50 MG PO TABS
2.0000 | ORAL_TABLET | ORAL | Status: DC
  Administered 2021-12-23: 2 via ORAL
  Filled 2021-12-23: qty 2

## 2021-12-23 MED ORDER — ONDANSETRON HCL 4 MG/2ML IJ SOLN: 4.0000 mg | INTRAMUSCULAR | Status: DC | PRN

## 2021-12-23 MED ORDER — LACTATED RINGERS AMNIOINFUSION
INTRAVENOUS | Status: DC
  Filled 2021-12-23 (×2): qty 1000

## 2021-12-23 MED ORDER — OXYCODONE-ACETAMINOPHEN 5-325 MG PO TABS: 1.0000 | ORAL_TABLET | ORAL | Status: DC | PRN

## 2021-12-23 MED ORDER — TETANUS-DIPHTH-ACELL PERTUSSIS 5-2.5-18.5 LF-MCG/0.5 IM SUSY: 0.5000 mL | PREFILLED_SYRINGE | Freq: Once | INTRAMUSCULAR | Status: DC

## 2021-12-23 MED ORDER — DIBUCAINE (PERIANAL) 1 % EX OINT
1.0000 | TOPICAL_OINTMENT | CUTANEOUS | Status: DC | PRN
  Administered 2021-12-24: 1 via RECTAL
  Filled 2021-12-23 (×2): qty 28

## 2021-12-23 NOTE — Progress Notes (Addendum)
Subjective:  Comfortable with epidural. Was sitting with knees together, now on the right side with peanut ball.   Objective:   Vitals: Blood pressure (!) 119/58, pulse 87, temperature 98.7 F (37.1 C), temperature source Oral, resp. rate 17, height 5\' 7"  (1.702 m), weight 99 kg, last menstrual period 03/24/2021, SpO2 100 %. General: NAD Abdomen:non tender  Cervical Exam:  Dilation: 6 Effacement (%): 90 Cervical Position: Posterior Station: Plus 1 Presentation: Vertex Exam by:: L Challis Crill, CNM  FSE: baseline 150, moderate variability, pos accel, early and variable present IUPC:q 2-3, MVU 193 Pitocin at 14 milli-units/hour  Results for orders placed or performed during the hospital encounter of 12/22/21 (from the past 24 hour(s))  CBC     Status: None   Collection Time: 12/22/21  8:47 AM  Result Value Ref Range   WBC 6.3 4.0 - 10.5 K/uL   RBC 4.60 3.87 - 5.11 MIL/uL   Hemoglobin 12.3 12.0 - 15.0 g/dL   HCT 12/24/21 16.1 - 09.6 %   MCV 82.8 80.0 - 100.0 fL   MCH 26.7 26.0 - 34.0 pg   MCHC 32.3 30.0 - 36.0 g/dL   RDW 04.5 40.9 - 81.1 %   Platelets 172 150 - 400 K/uL   nRBC 0.0 0.0 - 0.2 %  Type and screen     Status: None   Collection Time: 12/22/21  8:47 AM  Result Value Ref Range   ABO/RH(D) A POS    Antibody Screen NEG    Sample Expiration      12/25/2021,2359 Performed at [facility], [address] [place], [place]   Protein / creatinine ratio, urine     Status: None   Collection Time: 12/22/21  8:47 AM  Result Value Ref Range   Creatinine, Urine 30 mg/dL   Total Protein, Urine <6 mg/dL   Protein Creatinine Ratio        0.00 - 0.15 mg/mg[Cre]  Comprehensive metabolic panel     Status: Abnormal   Collection Time: 12/22/21  8:47 AM  Result Value Ref Range   Sodium 137 135 - 145 mmol/L   Potassium 3.5 3.5 - 5.1 mmol/L   Chloride 109 98 - 111 mmol/L   CO2 23 22 - 32 mmol/L   Glucose, Bld 111 (H) 70 - 99 mg/dL   BUN 8 6 - 20 mg/dL    Creatinine, Ser 12/24/21 0.44 - 1.00 mg/dL   Calcium 9.0 8.9 - 6.21 mg/dL   Total Protein 6.0 (L) 6.5 - 8.1 g/dL   Albumin 3.1 (L) 3.5 - 5.0 g/dL   AST 20 15 - 41 U/L   ALT 16 0 - 44 U/L   Alkaline Phosphatase 143 (H) 38 - 126 U/L   Total Bilirubin 0.6 0.3 - 1.2 mg/dL   GFR, Estimated 30.8 >65 mL/min   Anion gap 5 5 - 15    Assessment:   21 y.o. 30 109w1d admitted for IOL for LGA fetus  Plan:  1) Labor: In active labor, will revaluate in 2-4 hours.        TOLAC: continuous monitoring       NICU at birth for mec    2) Fetus - category 2 tracing, amnioinfusion infusing.    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   [redacted]w[redacted]d, CNM  Carie Caddy, [facility]  @TODAY @  5:54 AM

## 2021-12-23 NOTE — Discharge Summary (Signed)
Postpartum Discharge Summary  Date of Service updated 12/24/21     Patient Name: Sara Henson DOB: August 11, 2000 MRN: [id number]  Date of admission: 12/22/2021 Delivery date:12/23/2021  Delivering provider: Philip Aspen  Date of discharge: 12/24/21  Admitting diagnosis: Encounter for induction of labor [Z34.90] Intrauterine pregnancy: [redacted]w[redacted]d    Secondary diagnosis:  Principal Problem:   Encounter for induction of labor VBAC Additional problems: History postpartum Depression, History eating disorder    Discharge diagnosis: Term Pregnancy Delivered and VBAC                                              Post partum procedures: none Augmentation: AROM and Pitocin Complications: None  Hospital course: Induction of Labor With Vaginal Delivery   21y.o. yo G3P1011 at 359w1das admitted to the hospital 12/22/2021 for induction of labor.  Indication for induction:  Macrosomia .  Patient had an uncomplicated labor course as follows: Membrane Rupture Time/Date: 5:15 PM ,12/22/2021   Delivery Method:Vaginal, Spontaneous  Episiotomy: None  Lacerations:  2nd degree vaginal , bilateral labial  Details of delivery can be found in separate delivery note.  Patient had a routine postpartum course. Patient is discharged home 12/23/21.  Newborn Data: Birth date:12/23/2021  Birth time:10:01 AM  Gender:Female  Living status:Living  Apgars:4 ,5  Weight: 9lbs 2oz  Magnesium Sulfate received: No BMZ received: No Rhophylac:N/A MMR:No T-DaP:Given prenatally Flu: N/A Transfusion:No  Physical exam  Vitals:   12/23/21 0825 12/23/21 0857 12/23/21 1015 12/23/21 1032  BP:  (!) 150/82 (!) 118/51 (!) 109/48  Pulse:  (!) 128 (!) 123 90  Resp:      Temp: (!) 97.3 F (36.3 C)     TempSrc: Axillary     SpO2:  96% 94% 95%  Weight:      Height:       General: alert and cooperative Lochia: appropriate Uterine Fundus: firm Laceration: Healing well with no significant drainage DVT Evaluation: No  evidence of DVT seen on physical exam. Labs: Lab Results  Component Value Date   WBC 6.3 12/22/2021   HGB 12.3 12/22/2021   HCT 38.1 12/22/2021   MCV 82.8 12/22/2021   PLT 172 12/22/2021      Latest Ref Rng & Units 12/22/2021    8:47 AM  CMP  Glucose 70 - 99 mg/dL 111   BUN 6 - 20 mg/dL 8   Creatinine 0.44 - 1.00 mg/dL 0.53   Sodium 135 - 145 mmol/L 137   Potassium 3.5 - 5.1 mmol/L 3.5   Chloride 98 - 111 mmol/L 109   CO2 22 - 32 mmol/L 23   Calcium 8.9 - 10.3 mg/dL 9.0   Total Protein 6.5 - 8.1 g/dL 6.0   Total Bilirubin 0.3 - 1.2 mg/dL 0.6   Alkaline Phos 38 - 126 U/L 143   AST 15 - 41 U/L 20   ALT 0 - 44 U/L 16    Edinburgh Score:    01/08/2020    2:18 PM  Edinburgh Postnatal Depression Scale Screening Tool  I have been able to laugh and see the funny side of things. 0  I have looked forward with enjoyment to things. 0  I have blamed myself unnecessarily when things went wrong. 2  I have been anxious or worried for no good reason. 2  I have felt scared  or panicky for no good reason. 2  Things have been getting on top of me. 2  I have been so unhappy that I have had difficulty sleeping. 0  I have felt sad or miserable. 1  I have been so unhappy that I have been crying. 0  The thought of harming myself has occurred to me. 0  Edinburgh Postnatal Depression Scale Total 9      After visit meds:     Discharge home in stable condition Infant Feeding: Breast Infant Disposition:home with mother Discharge instruction: per After Visit Summary and Postpartum booklet. Activity: Advance as tolerated. Pelvic rest for 6 weeks.  Diet: routine diet Anticipated Birth Control:  LAM/NFP Postpartum Appointment:2 weeks and 6 weeks at [facility] Additional Postpartum F/U:  N/A Future Appointments: Future Appointments  Date Time Provider Lydia  12/24/2021  9:35 AM Imagene Riches, CNM [facility] None  01/18/2022  1:00 PM Brita Romp, NP [facility] None   Follow up  Visit:     12/24/21 Lloyd Huger, CNM

## 2021-12-23 NOTE — Lactation Note (Signed)
This note was copied from a baby's chart. Lactation Consultation Note  Patient Name: Sara Henson [id number] Date: 12/23/2021 Reason for consult: L&D Initial assessment Age:23 hours  Lactation to the L&D for initial visit. Mother is holding the baby in cradle on the left. Baby was latched well and positioned deep onto the breast, has a rhythmic sucking pattern with swallows noted.  Encouraged feeding on demand and with cues. If baby is not cueing encouraged hand expression and skin to skin. Baby was left skin to skin and feeding with Mother. Encouraged 8 or more attempts in the first 24 hours and 8 or more good feeds after 24 HOL. Mother has no further questions at this time.   Maternal Data Does the patient have breastfeeding experience prior to this delivery?: Yes  Feeding Mother's Current Feeding Choice: Breast Milk  LATCH Score Latch: Grasps breast easily, tongue down, lips flanged, rhythmical sucking.  Audible Swallowing: A few with stimulation  Type of Nipple: Everted at rest and after stimulation  Comfort (Breast/Nipple): Soft / non-tender  Hold (Positioning): No assistance needed to correctly position infant at breast.  LATCH Score: 9    Interventions Interventions: Breast feeding basics reviewed  Discharge    Consult Status Consult Status: Follow-up from L&D    Kyona Chauncey D Romyn Boswell 12/23/2021, 11:28 AM

## 2021-12-23 NOTE — Progress Notes (Signed)
LABOR NOTE   Sara Henson 21 y.o.GP@ at [redacted]w[redacted]d  SUBJECTIVE:  Pushing for a little over an 1hr , feeling lots of pressure Analgesia: Epidural  OBJECTIVE:  BP (!) 150/82   Pulse (!) 128   Temp (!) 97.3 F (36.3 C) (Axillary)   Resp 20   Ht 5\' 7"  (1.702 m)   Wt 99 kg   LMP 03/24/2021   SpO2 96%   BMI 34.18 kg/m  Total I/O In: -  Out: 400 [Urine:400]  She has shown cervical change. CERVIX: complete/plus 2 station:   SVE:   Dilation: 10 Effacement (%): 90 Station: Plus 1 Exam by:: A Berenise Hunton, CNM CONTRACTIONS: regular, every 2 minutes FHR: Fetal heart tracing reviewed. Baseline: 150 bpm, Variability: Good {> 6 bpm), Accelerations: Non-reactive but appropriate for gestational age, and Decelerations: variable/early with pushing  Category II    Labs: Lab Results  Component Value Date   WBC 6.3 12/22/2021   HGB 12.3 12/22/2021   HCT 38.1 12/22/2021   MCV 82.8 12/22/2021   PLT 172 12/22/2021    ASSESSMENT: 1) Labor curve reviewed.       Progress: Active phase labor.     Membranes: ruptured, clear fluid           2) pushing well  Principal Problem:   Encounter for induction of labor   PLAN: continue present management  12/24/2021, CNM  12/23/2021 9:09 AM

## 2021-12-23 NOTE — Progress Notes (Signed)
Subjective:  RN called reporting about a 7 minute decel that had recovered to baseline also variables and earlier present.   Pt comfortable with epidural, feeling rectal pressure.   Objective:   Vitals: Blood pressure (!) 119/58, pulse 87, temperature 98.9 F (37.2 C), temperature source Oral, resp. rate 17, height 5\' 7"  (1.702 m), weight 99 kg, last menstrual period 03/24/2021, SpO2 100 %. General: NAD Abdomen:non tender  Cervical Exam:  Dilation: 4 Effacement (%): 90 Cervical Position: Posterior Station: 0 Presentation: Vertex Exam by:: Shawnie Nicole cnm  FHT: baseline 140, moderate variability, neg accel, early or variable present IUPC: q 2-4, MVU 180 Pitocin at 12 milli-units/hour   Results for orders placed or performed during the hospital encounter of 12/22/21 (from the past 24 hour(s))  CBC     Status: None   Collection Time: 12/22/21  8:47 AM  Result Value Ref Range   WBC 6.3 4.0 - 10.5 K/uL   RBC 4.60 3.87 - 5.11 MIL/uL   Hemoglobin 12.3 12.0 - 15.0 g/dL   HCT 12/24/21 92.4 - 26.8 %   MCV 82.8 80.0 - 100.0 fL   MCH 26.7 26.0 - 34.0 pg   MCHC 32.3 30.0 - 36.0 g/dL   RDW 34.1 96.2 - 22.9 %   Platelets 172 150 - 400 K/uL   nRBC 0.0 0.0 - 0.2 %  Type and screen     Status: None   Collection Time: 12/22/21  8:47 AM  Result Value Ref Range   ABO/RH(D) A POS    Antibody Screen NEG    Sample Expiration      12/25/2021,2359 Performed at [facility], [address] [place], [place]   Protein / creatinine ratio, urine     Status: None   Collection Time: 12/22/21  8:47 AM  Result Value Ref Range   Creatinine, Urine 30 mg/dL   Total Protein, Urine <6 mg/dL   Protein Creatinine Ratio        0.00 - 0.15 mg/mg[Cre]  Comprehensive metabolic panel     Status: Abnormal   Collection Time: 12/22/21  8:47 AM  Result Value Ref Range   Sodium 137 135 - 145 mmol/L   Potassium 3.5 3.5 - 5.1 mmol/L   Chloride 109 98 - 111 mmol/L   CO2 23 22 - 32 mmol/L   Glucose,  Bld 111 (H) 70 - 99 mg/dL   BUN 8 6 - 20 mg/dL   Creatinine, Ser 12/24/21 0.44 - 1.00 mg/dL   Calcium 9.0 8.9 - 4.17 mg/dL   Total Protein 6.0 (L) 6.5 - 8.1 g/dL   Albumin 3.1 (L) 3.5 - 5.0 g/dL   AST 20 15 - 41 U/L   ALT 16 0 - 44 U/L   Alkaline Phosphatase 143 (H) 38 - 126 U/L   Total Bilirubin 0.6 0.3 - 1.2 mg/dL   GFR, Estimated 40.8 >14 mL/min   Anion gap 5 5 - 15    Assessment:   21 y.o. G3P1011 [redacted]w[redacted]d admitted for IOL for LGA fetus  Plan:    1) Labor -in early labor, 4 cm since 1540,  however more effaced and vertex at a lower station, will reevaluate in 2 hours.       TOLAC: continuous monitoring       NICU at birth for mec   2) Fetus - category 2 tracing, amnioinfusion ordered, RN to start if variables persist.    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   Carie Caddy, CNM  Domingo Pulse, [facility]  @TODAY @  2:20 AM

## 2021-12-23 NOTE — Progress Notes (Signed)
Subjective:  Comfortable with epidural. Feels more rectal pressure.   Objective:   Vitals: Blood pressure (!) 119/58, pulse 87, temperature 98.8 F (37.1 C), temperature source Oral, resp. rate 17, height 5\' 7"  (1.702 m), weight 99 kg, last menstrual period 03/24/2021, SpO2 100 %. General: NAD  Abdomen:non tender  Cervical Exam:  Dilation: 5 Effacement (%): 90 Cervical Position: Posterior Station: 0, Plus 1 Presentation: Vertex Exam by:: L Mckenzie Toruno, CNM  FHT: baseline 150, moderate variability, neg accel, early or variable present IUPC: q 2-3, MVU 180 Pitocin at 12 milli-units/hour  Results for orders placed or performed during the hospital encounter of 12/22/21 (from the past 24 hour(s))  CBC     Status: None   Collection Time: 12/22/21  8:47 AM  Result Value Ref Range   WBC 6.3 4.0 - 10.5 K/uL   RBC 4.60 3.87 - 5.11 MIL/uL   Hemoglobin 12.3 12.0 - 15.0 g/dL   HCT 12/24/21 99.2 - 42.6 %   MCV 82.8 80.0 - 100.0 fL   MCH 26.7 26.0 - 34.0 pg   MCHC 32.3 30.0 - 36.0 g/dL   RDW 83.4 19.6 - 22.2 %   Platelets 172 150 - 400 K/uL   nRBC 0.0 0.0 - 0.2 %  Type and screen     Status: None   Collection Time: 12/22/21  8:47 AM  Result Value Ref Range   ABO/RH(D) A POS    Antibody Screen NEG    Sample Expiration      12/25/2021,2359 Performed at [facility], [address] [place], [place]   Protein / creatinine ratio, urine     Status: None   Collection Time: 12/22/21  8:47 AM  Result Value Ref Range   Creatinine, Urine 30 mg/dL   Total Protein, Urine <6 mg/dL   Protein Creatinine Ratio        0.00 - 0.15 mg/mg[Cre]  Comprehensive metabolic panel     Status: Abnormal   Collection Time: 12/22/21  8:47 AM  Result Value Ref Range   Sodium 137 135 - 145 mmol/L   Potassium 3.5 3.5 - 5.1 mmol/L   Chloride 109 98 - 111 mmol/L   CO2 23 22 - 32 mmol/L   Glucose, Bld 111 (H) 70 - 99 mg/dL   BUN 8 6 - 20 mg/dL   Creatinine, Ser 12/24/21 0.44 - 1.00 mg/dL   Calcium  9.0 8.9 - 9.41 mg/dL   Total Protein 6.0 (L) 6.5 - 8.1 g/dL   Albumin 3.1 (L) 3.5 - 5.0 g/dL   AST 20 15 - 41 U/L   ALT 16 0 - 44 U/L   Alkaline Phosphatase 143 (H) 38 - 126 U/L   Total Bilirubin 0.6 0.3 - 1.2 mg/dL   GFR, Estimated 74.0 >81 mL/min   Anion gap 5 5 - 15    Assessment:   21 y.o. 30 [redacted]w[redacted]d admitted for IOL for LGA fetus  Plan:   1) Labor: now 5cm, will revaluate in 2-4 hours.        TOLAC: continuous monitoring       NICU at birth for mec    2) Fetus - category 2 tracing, amnioinfusion infusing, FSE applied.    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    [redacted]w[redacted]d, CNM  Carie Caddy, [facility]  @  TODAY@  4:27 AM

## 2021-12-23 NOTE — Progress Notes (Signed)
Strip reviewed with Dr Logan Bores,  Variables and early decels present Will increase Pitocin to improve contraction pattern Reassess around [address] CNM  Domingo Pulse, [facility]  @TODAY @  5:00 AM

## 2021-12-23 NOTE — Progress Notes (Addendum)
Subjective:  Resting comfortably with epidural. Feels some rectal pressure. Was on right side, now in throne position.   Objective:   Vitals: Blood pressure (!) 111/54, pulse 74, temperature 98.8 F (37.1 C), temperature source Oral, resp. rate 17, height 5\' 7"  (1.702 m), weight 99 kg, last menstrual period 03/24/2021, SpO2 95 %. General: NAD Abdomen:non tender  Cervical Exam:  Dilation: 4 Effacement (%): 70 Cervical Position: Posterior Station: -1 Presentation: Vertex Exam by:: Haris Baack  CNM Caput present   FHT: baseline 135, moderate variability, pos accel , early decel present  Toco:q 2.5-5, soft resting tone  Pitocin at 40milli-units.hour  Results for orders placed or performed during the hospital encounter of 12/22/21 (from the past 24 hour(s))  CBC     Status: None   Collection Time: 12/22/21  8:47 AM  Result Value Ref Range   WBC 6.3 4.0 - 10.5 K/uL   RBC 4.60 3.87 - 5.11 MIL/uL   Hemoglobin 12.3 12.0 - 15.0 g/dL   HCT 12/24/21 78.2 - 95.6 %   MCV 82.8 80.0 - 100.0 fL   MCH 26.7 26.0 - 34.0 pg   MCHC 32.3 30.0 - 36.0 g/dL   RDW 21.3 08.6 - 57.8 %   Platelets 172 150 - 400 K/uL   nRBC 0.0 0.0 - 0.2 %  Type and screen     Status: None   Collection Time: 12/22/21  8:47 AM  Result Value Ref Range   ABO/RH(D) A POS    Antibody Screen NEG    Sample Expiration      12/25/2021,2359 Performed at [facility], [address] [place], [place]   Protein / creatinine ratio, urine     Status: None   Collection Time: 12/22/21  8:47 AM  Result Value Ref Range   Creatinine, Urine 30 mg/dL   Total Protein, Urine <6 mg/dL   Protein Creatinine Ratio        0.00 - 0.15 mg/mg[Cre]  Comprehensive metabolic panel     Status: Abnormal   Collection Time: 12/22/21  8:47 AM  Result Value Ref Range   Sodium 137 135 - 145 mmol/L   Potassium 3.5 3.5 - 5.1 mmol/L   Chloride 109 98 - 111 mmol/L   CO2 23 22 - 32 mmol/L   Glucose, Bld 111 (H) 70 - 99 mg/dL   BUN 8 6  - 20 mg/dL   Creatinine, Ser 12/24/21 0.44 - 1.00 mg/dL   Calcium 9.0 8.9 - 8.41 mg/dL   Total Protein 6.0 (L) 6.5 - 8.1 g/dL   Albumin 3.1 (L) 3.5 - 5.0 g/dL   AST 20 15 - 41 U/L   ALT 16 0 - 44 U/L   Alkaline Phosphatase 143 (H) 38 - 126 U/L   Total Bilirubin 0.6 0.3 - 1.2 mg/dL   GFR, Estimated 32.4 >40 mL/min   Anion gap 5 5 - 15    Assessment:   21 y.o. G3P1011 [redacted]w[redacted]d admitted for IOL for LGA fetus  Plan:   1) Labor -in early labor, 4 cm since 1540, small amount of caput present,  however more effaced and vertex at a lower station, IUPC  placed, will reevaluate in 4 hours.    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   [redacted]w[redacted]d, CNM  Domingo Pulse, [facility]  @TODAY @  12:05 AM

## 2021-12-24 ENCOUNTER — Encounter: Payer: Medicaid Other | Admitting: Obstetrics

## 2021-12-24 LAB — CBC
HCT: 28.3 % — ABNORMAL LOW (ref 36.0–46.0)
Hemoglobin: 9.1 g/dL — ABNORMAL LOW (ref 12.0–15.0)
MCH: 27.1 pg (ref 26.0–34.0)
MCHC: 32.2 g/dL (ref 30.0–36.0)
MCV: 84.2 fL (ref 80.0–100.0)
Platelets: 142 10*3/uL — ABNORMAL LOW (ref 150–400)
RBC: 3.36 MIL/uL — ABNORMAL LOW (ref 3.87–5.11)
RDW: 15.3 % (ref 11.5–15.5)
WBC: 9.5 10*3/uL (ref 4.0–10.5)
nRBC: 0 % (ref 0.0–0.2)

## 2021-12-24 MED ORDER — CALCIUM CARBONATE ANTACID 500 MG PO CHEW
1.0000 | CHEWABLE_TABLET | Freq: Two times a day (BID) | ORAL | Status: DC
  Administered 2021-12-24: 200 mg via ORAL
  Filled 2021-12-24: qty 1

## 2021-12-24 MED ORDER — FERROUS SULFATE 325 (65 FE) MG PO TABS: 325.0000 mg | ORAL_TABLET | Freq: Every day | ORAL | 3 refills | Status: DC

## 2021-12-24 NOTE — Anesthesia Postprocedure Evaluation (Signed)
Anesthesia Post Note  Patient: Sara Henson  Procedure(s) Performed: AN AD HOC LABOR EPIDURAL  Patient location during evaluation: Mother Baby Anesthesia Type: Epidural Level of consciousness: oriented and awake and alert Pain management: pain level controlled Vital Signs Assessment: post-procedure vital signs reviewed and stable Respiratory status: spontaneous breathing and respiratory function stable Cardiovascular status: blood pressure returned to baseline and stable Postop Assessment: no headache, no backache, no apparent nausea or vomiting and able to ambulate Anesthetic complications: no   No notable events documented.   Last Vitals:  Vitals:   12/23/21 2340 12/24/21 0820  BP: 126/64 (!) 108/51  Pulse: 74 83  Resp: 17 20  Temp: 36.8 C 36.7 C  SpO2: 99% 98%    Last Pain:  Vitals:   12/24/21 0259  TempSrc:   PainSc: 5                  Starling Manns

## 2021-12-24 NOTE — Final Progress Note (Signed)
Post Partum Day 1 Subjective: Sara Henson feels well and is ready to go home. She is breastfeeding well, although having some nipple tenderness. Her mood is good, and she has outpatient psychiatry f/u scheduled. She feels that her mood is stable on her current dose of Prozac. She is ambulating and voiding without difficulty and tolerating POs. Her pain is well-controlled, and her bleeding is minimal. We discussed her decreased hemoglobin; she has iron supplements at home already that she will take.    Objective: Blood pressure (!) 108/51, pulse 83, temperature 98 F (36.7 C), resp. rate 20, height 5\' 7"  (1.702 m), weight 99 kg, last menstrual period 03/24/2021, SpO2 98 %, unknown if currently breastfeeding.  Physical Exam:  General: alert, cooperative, and appears stated age 21: appropriate Uterine Fundus: firm Laceration: healing well DVT Evaluation: No evidence of DVT seen on physical exam.  Recent Labs    12/22/21 0847 12/24/21 0607  HGB 12.3 9.1*  HCT 38.1 28.3*    Assessment/Plan: Discharge home Breastfeeding Follow up in 2 weeks and 6 weeks at [facility] Outpatient psychiatry f/u scheduled already Discharge instructions given Plans LAM/NFP for birth control   LOS: 2 days   [facility], CNM 12/24/2021, 12:27 PM

## 2021-12-24 NOTE — Discharge Instructions (Signed)

## 2021-12-24 NOTE — Progress Notes (Signed)
Patient discharged home with family.  Discharge instructions, when to follow up, and prescriptions reviewed with patient.  Patient verbalized understanding. Patient will be escorted out by auxiliary.   

## 2021-12-24 NOTE — Lactation Note (Signed)
This note was copied from a baby's chart. Lactation Consultation Note  Patient Name: Sara Henson [id number] Date: 12/24/2021 Reason for consult: Follow-up assessment Age:23 hours Lactation Rounds: LC to the room for a visit. Mother has baby latched in cradle on the right. Mother states her nipple is compressed when baby comes off the breast. Encouraged deeper latch by stroking nipple nose to chin and bringing baby to the breast with his chin first.  Baby was relatched deeper and has rhythmic sucking pattern with swallows noted. Mother states baby was cluster feeding overnight.  LC reviewed and encouraged feeding on demand and with cues. If baby is not cueing we encourage hand expression.  Reviewed diaper counts for days of life and when to call Peds with questions. Reviewed "understanding Postpartum and Newborn care " booklet at bedside. Reviewed outpatient Lactation number and resources. Baby is following up with [facility] and they have LC available. Pacifier usage reviewed by NP, pumping, and bottles are not encouraged until breastfeeding is established and going well in the first 4 weeks. Parents stated understanding with all teaching.   Maternal Data Has patient been taught Hand Expression?: Yes Does the patient have breastfeeding experience prior to this delivery?: Yes How long did the patient breastfeed?: pumped, difficult latch  Feeding Mother's Current Feeding Choice: Breast Milk  LATCH Score Latch: Grasps breast easily, tongue down, lips flanged, rhythmical sucking.  Audible Swallowing: Spontaneous and intermittent (requires some stim at breast, swallows easily)  Type of Nipple: Everted at rest and after stimulation  Comfort (Breast/Nipple): Filling, red/small blisters or bruises, mild/mod discomfort  Hold (Positioning): Assistance needed to correctly position infant at breast and maintain latch.  LATCH Score: 8    Interventions Interventions: Breast feeding basics  reviewed;Hand express;Position options;Adjust position;Education  Discharge Discharge Education: Engorgement and breast care;Warning signs for feeding baby Pump: Personal (has an Elvie and Spectra at home) [facility] Program: No  Consult Status Consult Status: PRN    Sara Henson 12/24/2021, 9:44 AM

## 2021-12-30 ENCOUNTER — Encounter: Payer: Self-pay | Admitting: Obstetrics & Gynecology

## 2021-12-30 ENCOUNTER — Ambulatory Visit (INDEPENDENT_AMBULATORY_CARE_PROVIDER_SITE_OTHER): Payer: Medicaid Other | Admitting: Obstetrics & Gynecology

## 2021-12-30 ENCOUNTER — Telehealth: Payer: Self-pay

## 2021-12-30 VITALS — BP 130/80 | Ht 67.0 in

## 2021-12-30 DIAGNOSIS — Z98891 History of uterine scar from previous surgery: Secondary | ICD-10-CM

## 2021-12-30 NOTE — Telephone Encounter (Signed)
Pt calling; delivered 6d ago; her bleeding lightened up significantly then yesterday it got heavier and bright red.  What to do?  Poss popped a stitch?  [phone]  Pt states bleeding lightened up to just bloddy d/c like the end of a period; felt a weird tug yesterday; when she went to the bathroom after that she was bleeding bright red. Tx'd to AM for scheduling.

## 2022-01-05 ENCOUNTER — Ambulatory Visit: Payer: Medicaid Other | Admitting: Obstetrics

## 2022-01-05 ENCOUNTER — Encounter: Payer: Self-pay | Admitting: Licensed Practical Nurse

## 2022-01-08 ENCOUNTER — Ambulatory Visit: Payer: Medicaid Other | Admitting: Obstetrics

## 2022-01-08 ENCOUNTER — Ambulatory Visit (INDEPENDENT_AMBULATORY_CARE_PROVIDER_SITE_OTHER): Payer: Medicaid Other | Admitting: Obstetrics

## 2022-01-08 NOTE — Progress Notes (Signed)
Postpartum Visit  Chief Complaint:  Chief Complaint  Patient presents with   Postpartum Care    History of Present Illness: Patient is a 21 y.o. [id number] presents for postpartum visit at 2 weeks post delivery. She is smiling and shares that she feels great.  Date of delivery: 12/23/2021 Type of delivery: Vaginal delivery - Vacuum or forceps assisted  no Episiotomy No.  Laceration: yes 2nd degree Pregnancy or labor problems:  no Any problems since the delivery:  no  Newborn Details:  SINGLETON :  1. Baby's name: son. Birth weight: 9lbs 2 oz Maternal Details:  Breast Feeding:  yes Post partum depression/anxiety noted:  no Edinburgh Post-Partum Depression Score:  4    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   Had inpatient treatment at [facility] Sees perinatal pysch    Past Surgical History:  Procedure Laterality Date   CESAREAN SECTION N/A 09/28/2019   Procedure: CESAREAN SECTION;  Surgeon: Vena Austria, MD;  Location: [facility] ORS;  Service: Obstetrics;  Laterality: N/A;  [id number] apgar-8,9 weight 9lbs 11oz   COLPOSCOPY     TONSILLECTOMY     TYMPANOSTOMY TUBE PLACEMENT      Prior to Admission medications   Medication Sig Start Date End Date Taking? Authorizing Provider  FLUoxetine (PROZAC) 20 MG capsule Take 60 mg by mouth every morning. 08/13/21  Yes [provider]    No Known Allergies   Social History   Socioeconomic History   Marital status: Married    Spouse name: Gardiner Barefoot   Number of children: 0   Years of education: 13   Highest education level: High school graduate  Occupational History   Occupation: Unemployed  Tobacco Use   Smoking status: Never   Smokeless tobacco: Former    Types: Associate Professor Use: Former   Devices: quit 2020  Substance and  Sexual Activity   Alcohol use: Not Currently   Drug use: No   Sexual activity: Yes  Other Topics Concern   Not on file  Social History Narrative   Not on file   Social Determinants of Health   Financial Resource Strain: Not on file  Food Insecurity: Not on file  Transportation Needs: Not on file  Physical Activity: Not on file  Stress: Not on file  Social Connections: Not on file  Intimate Partner Violence: Not on file    Family History  Problem Relation Age of Onset   Skin cancer Father     Review of Systems  Constitutional: Negative.   Eyes: Negative.   Respiratory: Negative.    Cardiovascular: Negative.   Skin: Negative.   Psychiatric/Behavioral: Negative.    All other systems reviewed and are negative.    Physical Exam BP 122/70   Wt 192 lb (87.1 kg)   BMI 30.07 kg/m   Physical Exam Constitutional:      Appearance: Normal appearance.  Genitourinary:     Genitourinary Comments: deferred  Cardiovascular:     Rate and Rhythm: Normal rate and regular rhythm.  Pulmonary:     Effort: Pulmonary effort is normal.     Breath sounds: Normal breath sounds.  Neurological:     Mental Status: She is alert.  Skin:    General: Skin is warm and dry.      Female Chaperone present during breast  and/or pelvic exam.2  A:  Patient for mood check at 2 weeks postpartum - doing well. No depressive or anxious symptoms  Plan: Problem List Items Addressed This Visit   None  P: She will return in 4 weeks for her physical exam.   Mirna Mires, CNM  01/08/2022 3:49 PM   01/08/2022 3:45 PM

## 2022-01-15 ENCOUNTER — Telehealth: Payer: Self-pay

## 2022-01-15 MED ORDER — DICLOXACILLIN SODIUM 500 MG PO CAPS
500.0000 mg | ORAL_CAPSULE | Freq: Four times a day (QID) | ORAL | 0 refills | Status: DC
Start: 2022-01-15 — End: 2022-02-09

## 2022-01-15 NOTE — Telephone Encounter (Signed)
Pt calling; believes she has mastitis; has flu like sxs.  [phone]  Pt states she has body aches, right side of right breast is really, really, really tender.  Adv will send in dicloxicillin; take it four times a day, take tylenol or IBU, stay on feeding schedule, ensure a good latch, push fluids and rest, if no better by Monday to call to be seen.  Per new protocol

## 2022-01-18 ENCOUNTER — Encounter: Payer: Self-pay | Admitting: Nurse Practitioner

## 2022-01-18 ENCOUNTER — Ambulatory Visit (INDEPENDENT_AMBULATORY_CARE_PROVIDER_SITE_OTHER): Payer: Medicaid Other | Admitting: Nurse Practitioner

## 2022-01-18 VITALS — BP 116/74 | HR 76 | Ht 67.0 in | Wt 192.6 lb

## 2022-01-18 DIAGNOSIS — R7989 Other specified abnormal findings of blood chemistry: Secondary | ICD-10-CM

## 2022-01-18 DIAGNOSIS — E0590 Thyrotoxicosis, unspecified without thyrotoxic crisis or storm: Secondary | ICD-10-CM | POA: Diagnosis not present

## 2022-01-18 DIAGNOSIS — E041 Nontoxic single thyroid nodule: Secondary | ICD-10-CM

## 2022-01-18 NOTE — Progress Notes (Signed)
01/18/2022     Endocrinology Consult Note    Subjective:    Patient ID: Sara Henson, female    DOB: 06/17/2000, PCP [facility]   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   [facility] Sees perinatal pysch    Past Surgical History:  Procedure Laterality Date   CESAREAN SECTION N/A 09/28/2019   Procedure: CESAREAN SECTION;  Surgeon: Vena AustriaStaebler, Andreas, MD;  Location: [facility] ORS;  Service: Obstetrics;  Laterality: N/A;  UEA:[id number]:female [id number] apgar-8,9 weight 9lbs 11oz   COLPOSCOPY     TONSILLECTOMY     TYMPANOSTOMY TUBE PLACEMENT      Social History   Socioeconomic History   Marital status: Married    Spouse name: Gardiner Barefootathaniel   Number of children: 0   Years of education: 13   Highest education level: High school graduate  Occupational History   Occupation: Unemployed  Tobacco Use   Smoking status: Never   Smokeless tobacco: Former    Types: Associate ProfessorChew  Vaping Use   Vaping Use: Former   Devices: quit 2020  Substance and Sexual Activity   Alcohol use: Not Currently   Drug use: No   Sexual activity: Yes  Other Topics Concern   Not on file  Social History Narrative   Not on file   Social Determinants of Health   Financial Resource Strain: Not on file  Food Insecurity: Not on file  Transportation Needs: Not on file  Physical Activity: Not on file  Stress: Not on file  Social Connections: Not on file    Family History  Problem Relation Age of Onset   Skin cancer Father     Outpatient Encounter Medications as of 01/18/2022  Medication Sig   dicloxacillin (DYNAPEN) 500 MG capsule Take 1 capsule (500 mg total) by mouth 4 (four) times daily.   FLUoxetine (PROZAC) 20 MG capsule Take 80 mg by mouth every morning.   No facility-administered encounter  medications on file as of 01/18/2022.    ALLERGIES: No Known Allergies  VACCINATION STATUS: Immunization History  Administered Date(s) Administered   Influenza,inj,Quad PF,6+ Mos 03/07/2019   Tdap 07/16/2019, 10/07/2021     HPI  Sara HearingJami L Henson is 21 y.o. female who presents today with a medical history as above. she is being seen in consultation for hyperthyroidism requested by [facility]  she has been dealing with symptoms of anxiety, insomnia, intermittent palpitations on and off for 2 years (starting in 2020/2021). These symptoms are progressively worsening and troubling to her.  her most recent thyroid labs revealed suppressed TSH of 0.007 and normal FT4 of 0.93 on 10/23/21.  she does complain of feeling lump in left neck, which is worse when laying down on her side at night making it uncomfortable to swallow at times.  She denies choking, shortness of breath, no recent voice change. She notes she had similar problems during her first pregnancy.   she denies family history of thyroid dysfunction and denies family hx of thyroid cancer. she denies personal history of goiter. she is not on any anti-thyroid medications nor on any thyroid hormone supplements. Denies use of Biotin containing supplements, she notes she was on a prenatal vitamin but she has since stopped it since her son was born 3 weeks ago.  she is  willing to proceed with appropriate work up and therapy for thyrotoxicosis.   Review of systems  Constitutional: + Minimally fluctuating body weight, current Body mass index is 30.17 kg/m., no fatigue, no subjective hyperthermia, no subjective hypothermia Eyes: no blurry vision, no xerophthalmia ENT: no sore throat, + nodules palpated in throat, no dysphagia/odynophagia, no hoarseness Cardiovascular: no chest pain, no shortness of breath, + intermittent palpitations, no leg swelling Respiratory: no cough, no shortness of breath Gastrointestinal: no  nausea/vomiting/diarrhea Musculoskeletal: no muscle/joint aches Skin: no rashes, no hyperemia Neurological: no tremors, no numbness, no tingling, no dizziness Psychiatric: no depression, + anxiety, + insomnia   Objective:    BP 116/74 (BP Location: Left Arm, Patient Position: Sitting, Cuff Size: Large)   Pulse 76   Ht 5\' 7"  (1.702 m)   Wt 192 lb 9.6 oz (87.4 kg)   BMI 30.17 kg/m   Wt Readings from Last 3 Encounters:  01/18/22 192 lb 9.6 oz (87.4 kg)  01/08/22 192 lb (87.1 kg)  12/22/21 218 lb 4.1 oz (99 kg)     BP Readings from Last 3 Encounters:  01/18/22 116/74  01/08/22 122/70  12/30/21 130/80                        Physical Exam- Limited  Constitutional:  Body mass index is 30.17 kg/m. , not in acute distress, normal state of mind Eyes:  EOMI, no exophthalmos Neck: Supple Thyroid: + gross goiter, L>R, palpable nodule on left gland Cardiovascular: RRR, + murmur, rubs, or gallops, no edema Respiratory: Adequate breathing efforts, no crackles, rales, rhonchi, or wheezing Musculoskeletal: no gross deformities, strength intact in all four extremities, no gross restriction of joint movements Skin:  no rashes, no hyperemia Neurological: no tremor with outstretched hands, DTR normal in BLE   CMP     Component Value Date/Time   NA 137 12/22/2021 0847   NA 139 12/15/2021 1107   K 3.5 12/22/2021 0847   CL 109 12/22/2021 0847   CO2 23 12/22/2021 0847   GLUCOSE 111 (H) 12/22/2021 0847   BUN 8 12/22/2021 0847   BUN 5 (L) 12/15/2021 1107   CREATININE 0.53 12/22/2021 0847   CALCIUM 9.0 12/22/2021 0847   PROT 6.0 (L) 12/22/2021 0847   PROT 5.7 (L) 12/15/2021 1107   ALBUMIN 3.1 (L) 12/22/2021 0847   ALBUMIN 3.7 (L) 12/15/2021 1107   AST 20 12/22/2021 0847   ALT 16 12/22/2021 0847   ALKPHOS 143 (H) 12/22/2021 0847   BILITOT 0.6 12/22/2021 0847   BILITOT 0.4 12/15/2021 1107   GFRNONAA >60 12/22/2021 0847   GFRAA >60 09/27/2019 2026     CBC    Component Value  Date/Time   WBC 9.5 12/24/2021 0607   RBC 3.36 (L) 12/24/2021 0607   HGB 9.1 (L) 12/24/2021 0607   HGB 12.5 12/15/2021 1107   HCT 28.3 (L) 12/24/2021 0607   HCT 37.4 12/15/2021 1107   PLT 142 (L) 12/24/2021 0607   PLT 191 12/15/2021 1107   MCV 84.2 12/24/2021 0607   MCV 82 12/15/2021 1107   MCH 27.1 12/24/2021 0607   MCHC 32.2 12/24/2021 0607   RDW 15.3 12/24/2021 0607   RDW 14.4 12/15/2021 1107   LYMPHSABS 1.0 12/15/2021 1107   MONOABS 0.7 11/20/2021 1555   EOSABS 0.0 12/15/2021 1107   BASOSABS 0.0 12/15/2021 1107     Diabetic Labs (most recent): Lab Results  Component Value Date   HGBA1C 5.4 11/27/2021   HGBA1C  9.6 (A) 11/27/2021    Lipid Panel  No results found for: "CHOL", "TRIG", "HDL", "CHOLHDL", "VLDL", "LDLCALC", "LDLDIRECT", "LABVLDL"   Lab Results  Component Value Date   TSH 0.007 (L) 10/23/2021   TSH 0.077 (L) 11/13/2019   TSH 0.638 08/14/2019   TSH 0.737 08/14/2019   FREET4 0.56 (L) 08/14/2019     Thyroid US from 03/06/21 CLINICAL DATA:  21 year old female with a history of thyroid nodule   EXAM: THYROID ULTRASOUND   TECHNIQUE: Ultrasound examination of the thyroid gland and adjacent soft tissues was performed.   COMPARISON:  08/20/2019, 11/23/2019   FINDINGS: Parenchymal Echotexture: Normal   Isthmus: 0.2 cm   Right lobe: 4.3 cm x 1.0 cm x 1.2 cm   Left lobe: 5.2 cm x 1.6 cm x 2.5 cm   _________________________________________________________   Estimated total number of nodules >/= 1 cm: 1   Number of spongiform nodules >/=  2 cm not described below (TR1): 0   Number of mixed cystic and solid nodules >/= 1.5 cm not described below (TR2): 0   _________________________________________________________   Nodule within the left thyroid measured 3.2 cm on the baseline study 08/20/2019, and now measures 3.7 cm. This remains TR 2 with mixed cystic and solid components. No internal calcifications. Nodule does not meet criteria for  further surveillance or biopsy   No additional nodules.   No adenopathy   Recommendations follow those established by the new ACR TI-RADS criteria (J Am Coll Radiol 2017;14:587-595).   IMPRESSION: Unchanged appearance of the left-sided cystic/solid TR 2 thyroid nodule, previously 3.3 cm, currently 3.7 cm as above.     Electronically Signed   By: Gilmer Mor D.O.   On: 03/06/2021 16:16    Assessment & Plan:   1. Thyroid nodule 2. Abnormal TSH 3. Hyperthyroidism  she is being seen at a kind request of [facility] [facility]  her history and most recent labs are reviewed, and she was examined clinically. Subjective and objective findings are inconsistent with thyrotoxicosis from primary hyperthyroidism. However, the potential risks of untreated thyrotoxicosis and the need for definitive therapy have been discussed in detail with her, and she agrees to proceed with diagnostic workup and treatment plan.   I will repeat full profile thyroid function tests today (including antibody testing to assess for autoimmune thyroid dysfunction).  I will also order thyroid ultrasound to follow up on her growing left thyroid nodule.  Will hold off on uptake and scan until she has finished breastfeeding for her desired length of time.   Options of therapy are discussed with her.  We discussed the option of treating it with medications including methimazole or PTU which may have side effects including rash, transaminitis, and bone marrow suppression.  We also discussed the option of definitive therapy with RAI ablation of the thyroid. If she is found to have primary hyperthyroidism from Graves' disease , toxic multinodular goiter or toxic nodular goiter the preferred modality of treatment would be I-131 thyroid ablation. Surgery is another choice of treatment in some cases, in her case surgery is not a good fit for presentation with only mild goiter.  -Patient is made aware of the high  likelihood of post ablative hypothyroidism with subsequent need for lifelong thyroid hormone replacement. sheunderstands this outcome and she is  willing to proceed.      she will return in 3 weeks for treatment decision.  I did not initiate any new prescriptions at today's visit.    -Patient  is advised to maintain close follow up with [facility] for primary care needs.   - Time spent with the patient: 60 minutes, of which >50% was spent in obtaining information about her symptoms, reviewing her previous labs, evaluations, and treatments, counseling her about her hyperthyroidism , and developing a plan to confirm the diagnosis and long term treatment as necessary. Please refer to "Patient Self Inventory" in the Media tab for reviewed elements of pertinent patient history.  [name] participated in the discussions, expressed understanding, and voiced agreement with the above plans.  All questions were answered to her satisfaction. she is encouraged to contact clinic should she have any questions or concerns prior to her return visit.   Follow up plan: Return in about 3 weeks (around 02/08/2022) for Thyroid follow up, Previsit labs, thyroid ultrasound.   Thank you for involving me in the care of this pleasant patient, and I will continue to update you with her progress.    Rayetta Pigg, [facility] [address] [place], [place] [address] Phone: [phone] Fax: [phone]  01/18/2022, 2:13 PM

## 2022-01-18 NOTE — Patient Instructions (Signed)
Hyperthyroidism Hyperthyroidism refers to a thyroid gland that is too active or overactive. The thyroid gland is a small gland located in the lower front part of the neck, just in front of the windpipe (trachea). This gland makes hormones that: Help control how the body uses food for energy (metabolism). Help the heart and brain work well. Keep your bones strong. When the thyroid is overactive, it produces too much of a hormone called thyroxine. What are the causes? This condition may be caused by: Graves' disease. This is a disorder in which the body's disease-fighting system (immune system) attacks the thyroid gland. This is the most common cause. Inflammation of the thyroid gland. A tumor in the thyroid gland. Use of certain medicines, including: Prescription thyroid hormone replacement. Herbal supplements that mimic thyroid hormones. Amiodarone therapy. Solid or fluid-filled lumps within your thyroid gland (thyroid nodules). Taking in a large amount of iodine from foods or medicines. What increases the risk? You are more likely to develop this condition if: You are female. You have a family history of thyroid conditions. You smoke tobacco. You use a medicine called lithium. You take medicines that affect the immune system (immunosuppressants). What are the signs or symptoms? Symptoms of this condition include: Nervousness. Inability to tolerate heat. Diarrhea. Rapid heart rate. Shaky hands. Restlessness. Sleep problems. Other symptoms may include: Heart skipping beats or making extra beats. Unexplained weight loss. Change in the texture of hair or skin. Loss of menstruation. Fatigue. Enlarged thyroid gland or a lump in the thyroid (nodule). You may also have symptoms of Graves' disease, which may include: Protruding eyes. Dry eyes. Red or swollen eyes. Problems with vision. How is this diagnosed? This condition may be diagnosed based on: Your symptoms and medical  history. A physical exam. Blood tests. Thyroid ultrasound. This test involves using sound waves to produce images of the thyroid gland. A thyroid scan. A radioactive substance is injected into a vein, and images show how much iodine is present in the thyroid. Radioactive iodine uptake test (RAIU). A small amount of radioactive iodine is given by mouth to see how much iodine the thyroid absorbs after a certain amount of time. How is this treated? Treatment depends on the cause and severity of the condition. Treatment may include: Medicines to reduce the amount of thyroid hormone your body makes. Medicines to help manage your symptoms. Radioactive iodine treatment (radioiodine therapy). This involves swallowing a small dose of radioactive iodine, in capsule or liquid form, to kill thyroid cells. Surgery to remove part or all of your thyroid gland. You may need to take thyroid hormone replacement medicine for the rest of your life after thyroid surgery. Follow these instructions at home:  Take over-the-counter and prescription medicines only as told by your health care provider. Do not use any products that contain nicotine or tobacco. These products include cigarettes, chewing tobacco, and vaping devices, such as e-cigarettes. If you need help quitting, ask your health care provider. Follow any instructions from your health care provider about diet. You may be instructed to limit foods that contain iodine. Keep all follow-up visits. You will need to have blood tests regularly so that your health care provider can monitor your condition. Where to find more information National Institute of Diabetes and Digestive and Kidney Diseases: niddk.nih.gov Contact a health care provider if: Your symptoms do not get better with treatment. You have a fever. You have abdominal pain. You feel dizzy. You are taking thyroid hormone replacement medicine and: You have   symptoms of depression. You feel like you  are tired all the time. You gain weight. Get help right away if: You have sudden, unexplained confusion or other mental changes. You have chest pain. You have fast or irregular heartbeats (palpitations). You have difficulty breathing. These symptoms may be an emergency. Get help right away. Call 911. Do not wait to see if the symptoms will go away. Do not drive yourself to the hospital. Summary The thyroid gland is a small gland located in the lower front part of the neck, just in front of the windpipe. Hyperthyroidism is when the thyroid gland is too active and produces too much of a hormone called thyroxine. The most common cause is Graves' disease, a disorder in which your immune system attacks the thyroid gland. Hyperthyroidism can cause various symptoms, such as unexplained weight loss, nervousness, inability to tolerate heat, or changes in your heartbeat. Treatment may include medicine to reduce the amount of thyroid hormone your body makes, radioiodine therapy, surgery, or medicines to manage symptoms. This information is not intended to replace advice given to you by your health care provider. Make sure you discuss any questions you have with your health care provider. Document Revised: 06/12/2021 Document Reviewed: 06/12/2021 Elsevier Patient Education  2023 Elsevier Inc.  

## 2022-01-19 LAB — T3, FREE: T3, Free: 7.2 pg/mL — ABNORMAL HIGH (ref 2.0–4.4)

## 2022-01-19 LAB — TSH: TSH: <0.005 u[IU]/mL — ABNORMAL LOW (ref 0.450–4.500)

## 2022-01-19 LAB — THYROGLOBULIN ANTIBODY: Thyroglobulin Antibody: <1 IU/mL (ref 0.0–0.9)

## 2022-01-19 LAB — T4, FREE: Free T4: 1.85 ng/dL — ABNORMAL HIGH (ref 0.82–1.77)

## 2022-01-19 LAB — THYROID PEROXIDASE ANTIBODY: Thyroperoxidase Ab SerPl-aCnc: <9 IU/mL (ref 0–34)

## 2022-01-20 ENCOUNTER — Other Ambulatory Visit: Payer: Self-pay | Admitting: Nurse Practitioner

## 2022-01-20 DIAGNOSIS — E0590 Thyrotoxicosis, unspecified without thyrotoxic crisis or storm: Secondary | ICD-10-CM

## 2022-01-20 DIAGNOSIS — E041 Nontoxic single thyroid nodule: Secondary | ICD-10-CM

## 2022-01-20 DIAGNOSIS — R7989 Other specified abnormal findings of blood chemistry: Secondary | ICD-10-CM

## 2022-01-21 NOTE — Progress Notes (Signed)
Patient called and a voice mail was left. Will call patient in the morning with her results.

## 2022-01-22 ENCOUNTER — Telehealth: Payer: Self-pay | Admitting: Nurse Practitioner

## 2022-01-22 NOTE — Progress Notes (Signed)
Talked with Roselyn Reef, she would like to wait and repeat her lab work in 5 weeks before considering going on any medication. Also, she states that she has not heard back from [facility] about her Thyroid Scan.

## 2022-01-22 NOTE — Progress Notes (Signed)
Ok, we can do that.  Also, will you reach out to [facility] and see about that [place]?  Thanks!

## 2022-01-22 NOTE — Telephone Encounter (Signed)
New message  Patient returning call back to the nurse - test results

## 2022-01-22 NOTE — Telephone Encounter (Signed)
Patient was called and a message left. She has appointment with [facility] on Tuesday, September 26th at 11:00 am. She is to arrive 15 minutes early , 10:45 am. There are no restrictions. She may eat and drink as normal. I left the phone number for her to call if this is not a good date , [phone]. This is central scheduling for [place].

## 2022-01-26 ENCOUNTER — Ambulatory Visit: Admission: RE | Admit: 2022-01-26 | Payer: Medicaid Other | Source: Ambulatory Visit

## 2022-01-26 NOTE — Progress Notes (Signed)
Talked with the patient. She has appointment  01/26/2022 @ 10:45 am She was called and made aware.

## 2022-01-28 NOTE — Telephone Encounter (Signed)
Patient said she called over to [facility] and they do not have the referral. She said that the fax number for this is [phone]. Sara Henson is not back until Tuesday, can you look at this and re do this for the patient?

## 2022-02-02 NOTE — Telephone Encounter (Signed)
Melissa from [facility] called and said the order is missing the provider signature. Please have provider sign and re send

## 2022-02-04 ENCOUNTER — Encounter: Payer: Self-pay | Admitting: Obstetrics & Gynecology

## 2022-02-04 NOTE — Progress Notes (Signed)
Subjective  Post-operative visit Sara Henson is a 21 y.o. female who is here for a post-operative check.  She has been experiencing mild pain.   Review of Systems General: no fevers, chills, or night sweats  Objective  Wound:healing well, incision well approximated, no drainage, no dehiscence, minimal erythema, and no swelling  Assessment & Plan  Doing well postoperatively. After Cesarean Section Wound care discussed. Follow-up as ordered for postpartum visit  Rosario Adie, MD  02/04/2022 11:21 AM

## 2022-02-05 ENCOUNTER — Ambulatory Visit: Payer: Self-pay | Admitting: Licensed Practical Nurse

## 2022-02-08 ENCOUNTER — Ambulatory Visit: Payer: Medicaid Other | Admitting: Nurse Practitioner

## 2022-02-08 ENCOUNTER — Other Ambulatory Visit: Payer: Self-pay | Admitting: Licensed Practical Nurse

## 2022-02-09 ENCOUNTER — Telehealth: Payer: Self-pay

## 2022-02-09 ENCOUNTER — Other Ambulatory Visit: Payer: Self-pay | Admitting: Obstetrics and Gynecology

## 2022-02-09 MED ORDER — DICLOXACILLIN SODIUM 500 MG PO CAPS: 500.0000 mg | ORAL_CAPSULE | Freq: Four times a day (QID) | ORAL | 0 refills | Status: AC

## 2022-02-09 NOTE — Telephone Encounter (Signed)
Pt calling; was rx'd antibx for mastitis; did not take all of it; mastitis is back; wondering if she can get a refill so she will have a full 10d of antibx. [phone]

## 2022-02-09 NOTE — Telephone Encounter (Signed)
Patient is calling to follow up on medication request and also change pharmacy to [facility] in [place]. Patient is aware message has been sent to provider in office who is seeing patients.

## 2022-02-12 ENCOUNTER — Other Ambulatory Visit (HOSPITAL_COMMUNITY)
Admission: RE | Admit: 2022-02-12 | Discharge: 2022-02-12 | Disposition: A | Discharge status: Home / Self Care | Payer: Medicaid Other | Source: Ambulatory Visit | Attending: Licensed Practical Nurse | Admitting: Licensed Practical Nurse

## 2022-02-12 ENCOUNTER — Ambulatory Visit (INDEPENDENT_AMBULATORY_CARE_PROVIDER_SITE_OTHER): Payer: Medicaid Other | Admitting: Licensed Practical Nurse

## 2022-02-12 DIAGNOSIS — Z124 Encounter for screening for malignant neoplasm of cervix: Secondary | ICD-10-CM | POA: Diagnosis present

## 2022-02-12 DIAGNOSIS — N39498 Other specified urinary incontinence: Secondary | ICD-10-CM

## 2022-02-12 NOTE — Progress Notes (Unsigned)
Postpartum Visit  Chief Complaint:  Chief Complaint  Patient presents with   Postpartum Care    History of Present Illness: Patient is a 21 y.o. [id number] presents for postpartum visit.  Date of delivery: VBAC 8/23 Type of delivery: Vaginal delivery - Vacuum or forceps assisted  no Episiotomy No.  Laceration: yes 2nd degree Pregnancy or labor problems:  some depression Any problems since the delivery:  Mastitis, did not complete ATB so it came back, now is taking the full course and feels better.  Doing well, pleased with birth experience, really thrilled to have VBAC'd! Sleep: 3-4 hour stretches at night gets 2 hour nap during the day Mood: is much better compared to first birth, sometimes feels overwhelmed Sex: yes, has been fine, using condoms, desires more children-wants to get thyroid under control first.  [facility] Has not started exercise  Dental exam is due Wears glasses last eye exam 2 years ago    Newborn Details:  SINGLETON :  1. Baby's name: boy . Birth weight: 9lbs 2 oz  Maternal Details:  Breast Feeding:  yes Post partum depression/anxiety noted:  no Edinburgh Post-Partum Depression Score:  PHQ 9-5, GAD-7 2  Date of last PAP: 2021  ASC-US    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   Had inpatient treatment at [place] perinatal pysch    Past Surgical History:  Procedure Laterality Date   CESAREAN SECTION N/A 09/28/2019   Procedure: CESAREAN SECTION;  Surgeon: Malachy Mood, MD;  Location: [facility] ORS;  Service: Obstetrics;  Laterality: N/A;  [id number] apgar-8,9 weight 9lbs 11oz   COLPOSCOPY     TONSILLECTOMY     TYMPANOSTOMY TUBE PLACEMENT      Prior to Admission medications   Medication Sig Start Date End Date Taking? Authorizing Provider  dicloxacillin (DYNAPEN) 500 MG  capsule Take 1 capsule (500 mg total) by mouth 4 (four) times daily for 10 days. 02/09/22 02/19/22 Yes Rubie Maid, MD  FLUoxetine (PROZAC) 20 MG capsule Take 80 mg by mouth every morning. 08/13/21  Yes [provider]    No Known Allergies   Social History   Socioeconomic History   Marital status: Married    Spouse name: Winferd Humphrey   Number of children: 0   Years of education: 13   Highest education level: High school graduate  Occupational History   Occupation: Unemployed  Tobacco Use   Smoking status: Never   Smokeless tobacco: Former    Types: Nurse, children's Use: Former   Devices: quit 2020  Substance and Sexual Activity   Alcohol use: Not Currently   Drug use: No   Sexual activity: Yes  Other Topics Concern   Not on file  Social History Narrative   Not on file   Social Determinants of Health   Financial Resource Strain: Not on file  Food Insecurity: Not on file  Transportation Needs: Not on file  Physical Activity: Not on file  Stress: Not on file  Social Connections: Not on file  Intimate Partner Violence: Not on file    Family History  Problem Relation Age of Onset   Skin cancer Father     Review of Systems  Constitutional: Negative.   Gastrointestinal: Negative.   Genitourinary:        Difficulty holding urine  Urinary leakage  Neurological: Negative.   Psychiatric/Behavioral: Negative.       Physical Exam BP 119/82   Pulse 84   Wt 190 lb (86.2 kg)   Breastfeeding Yes   BMI 29.76 kg/m   Physical Exam Constitutional:      Appearance: Normal appearance.  Genitourinary:     Vulva normal.     Genitourinary Comments: Cervix pink, no lesions Bimanual exam: uterus non gravid, non tender no masses, adnexa non tender no masses,  little tone   Cardiovascular:     Rate and Rhythm: Normal rate and regular rhythm.     Heart sounds: No murmur heard. Pulmonary:     Effort: Pulmonary effort is normal.     Breath sounds: Normal  breath sounds.  Chest:     Comments: Breasts: lactating, no redness or masses, nipples intact bilaterally  Abdominal:     General: Abdomen is flat.     Tenderness: There is no abdominal tenderness.  Musculoskeletal:        General: Normal range of motion.     Cervical back: Normal range of motion and neck supple.     Right lower leg: No edema.     Left lower leg: No edema.  Neurological:     General: No focal deficit present.     Mental Status: She is alert.  Skin:    General: Skin is warm.  Psychiatric:        Mood and Affect: Mood normal.       Assessment: 21 y.o. [id number] presenting for 6 week postpartum visit  Plan: Problem List Items Addressed This Visit   None Visit Diagnoses     Other urinary incontinence    -  Primary   Relevant Orders   Ambulatory referral to Physical Therapy   Care and examination of lactating mother       Relevant Orders   Cytology - PAP   Ambulatory referral to Physical Therapy   Cervical cancer screening       Relevant Orders   Cytology - PAP        1) Contraception Education given regarding options for contraception, including barrier methods.  2)  Pap - ASCCP guidelines and rational discussed.  Patient opts for based on results  screening interval  3) Patient underwent screening for postpartum depression with No concerns noted.  4) Follow up 1 year for routine annual exam  Carie Caddy, CNM  [facility]  02/15/22  2:36 PM

## 2022-02-12 NOTE — Telephone Encounter (Signed)
Left detailed msg.

## 2022-02-16 LAB — CYTOLOGY - PAP
Chlamydia: NEGATIVE
Comment: NEGATIVE
Comment: NEGATIVE
Comment: NORMAL
Diagnosis: NEGATIVE
Neisseria Gonorrhea: NEGATIVE
Trichomonas: NEGATIVE

## 2022-02-17 ENCOUNTER — Ambulatory Visit: Payer: Medicaid Other | Attending: Licensed Practical Nurse

## 2022-02-17 DIAGNOSIS — N39498 Other specified urinary incontinence: Secondary | ICD-10-CM | POA: Insufficient documentation

## 2022-02-17 DIAGNOSIS — M6289 Other specified disorders of muscle: Secondary | ICD-10-CM | POA: Diagnosis not present

## 2022-02-17 DIAGNOSIS — R278 Other lack of coordination: Secondary | ICD-10-CM | POA: Diagnosis present

## 2022-02-17 DIAGNOSIS — M6281 Muscle weakness (generalized): Secondary | ICD-10-CM | POA: Diagnosis present

## 2022-02-17 NOTE — Therapy (Signed)
OUTPATIENT PHYSICAL THERAPY FEMALE PELVIC EVALUATION   Patient Name: Sara Henson MRN: [id number] DOB:May 08, 2000, 21 y.o., female Today's Date: 02/17/2022   PT End of Session - 02/17/22 1050     Visit Number 1    Number of Visits 10    Date for PT Re-Evaluation 04/28/22    Authorization Type IE: 02/17/22    PT Start Time 1055    PT Stop Time 1135    PT Time Calculation (min) 40 min    Activity Tolerance Patient tolerated treatment well             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   Had inpatient treatment at [facility] Sees perinatal pysch   Past Surgical History:  Procedure Laterality Date   CESAREAN SECTION N/A 09/28/2019   Procedure: CESAREAN SECTION;  Surgeon: Vena Austria, MD;  Location: [facility] ORS;  Service: Obstetrics;  Laterality: N/A;  [id number] apgar-8,9 weight 9lbs 11oz   COLPOSCOPY     TONSILLECTOMY     TYMPANOSTOMY TUBE PLACEMENT     Patient Active Problem List   Diagnosis Date Noted   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   History of eating disorder 10/07/2021   History of macrosomia in infant in prior pregnancy, currently pregnant 10/07/2021   PMDD (premenstrual dysphoric disorder) 10/06/2020   Thyroid nodule 08/22/2019   Subclinical hypothyroidism 08/14/2019   GERD without esophagitis 04/30/2016   Disordered eating 01/22/2013   Murmur 02/08/2012   Tachycardia 02/08/2012    PCP: [facility]  REFERRING PROVIDER: Ellwood Sayers, CNM   REFERRING DIAG:  Z39.1 (ICD-10-CM) - Care and examination of lactating mother  N39.498 (ICD-10-CM) - Other urinary incontinence   THERAPY DIAG:  Pelvic floor dysfunction  Other lack of coordination  Muscle weakness (generalized)   Rationale for Evaluation and Treatment: Rehabilitation  ONSET  DATE: After second pregnancy   RED FLAGS: N/A  Have you had any night sweats? Unexplained weight loss? Saddle anesthesia? Unexplained changes in bowel or bladder habits?   SUBJECTIVE: Patient confirms identification and approves PT to assess pelvic floor and treatment Yes                                                                                                                                                                                           PRECAUTIONS: None  WEIGHT BEARING RESTRICTIONS: No  FALLS:  Has patient fallen in last 6 months? Yes. Number of falls 9 months pregnant - fell on  hands and knees   OCCUPATION/SOCIAL ACTIVITIES: Stay at home, playing outdoors, shopping, weightlifting/HIIT, walking   PLOF: Independent   LIVING ENVIRONMENT: Lives with: lives with their family Lives in: House/apartment    CHIEF CONCERN: Pt gave birth Dec 23, 2021 and feels that she cannot control her bladder as much after this pregnancy. Pt noticed one night waking up and having soaked herself with urine. Pt also has urinary leakage with walking to the bathroom/laughing. Pt has had a frequency of UTIs since childhood (every other month to every 3 months now) and even some during pregnancy. Pt reports intermittent  L groin pain for many years but is worse with increased physical activity as well as LBP with increased activity.     PAIN:  Are you having pain? Yes NPRS scale: L adductor and occasionally radiates towards vagina, 8/10 (worst), 0/10 (current), intermittent   Aggravating factors: after increased physical activity     PATIENT GOALS: Pt would like to be stronger and strengthen the deep core postpartum, being able to control urinary, leakage, and to continue with education    UROLOGICAL HISTORY Fluid intake: water, 10 cups of coffee (stops at 7pm but depends on day), 3 cups per day   Pain with urination: No Fully empty bladder: Yes Stream: Strong Urgency:  No Frequency: 4-5x Nocturia: 0x  Leakage: Urge to void, Walking to the bathroom, and Laughing Pads: No  Amount: 3x/week having to change undergarments Bladder control (0-10): 6/10   GASTROINTESTINAL HISTORY Pain with bowel movement: Yes Type of bowel movement:Type (Bristol Stool Scale) between Type 1 and 4  and Strain Yes Frequency: 1-2x/day  Fully empty rectum: occasionally, bloated  Leakage: No   SEXUAL HISTORY/FUNCTION Pain with intercourse: discomfort, tenderness after intercourse  Ability to have vaginal penetration:  Yes; Deep thrusting: Yes Able to achieve orgasm?: Yes  OBSTETRICAL HISTORY Vaginal deliveries: G2P2  Tearing: Grade 2 - stitches with second birth C-section deliveries: first delivery  Currently pregnant: No  GYNECOLOGICAL HISTORY Hysterectomy: no  Pelvic Organ Prolapse: None Pain with exam: yes  Heaviness/pressure: no     OBJECTIVE:   COGNITION: Overall cognitive status: Within functional limits for tasks assessed     POSTURE:  Grossly  Lumbar lordosis:  Deferred 2/2 time constraints  Thoracic kyphosis: Iliac crest height:  Lumbar lateral shift:  Pelvic obliquity:  Leg length discrepancy:   GAIT: Deferred 2/2 time constraints  Distance walked:  Assistive device utilized:  Level of assistance:  Comments:   Trendelenburg:   SENSATION: Deferred 2/2 time constraints  Light touch: , L2-S2 dermatomes  Proprioception:    RANGE OF MOTION:  Deferred 2/2 time constraints   (Norm range in degrees)  LEFT  RIGHT   Lumbar forward flexion (65):      Lumbar extension (30):     Lumbar lateral flexion (25):     Thoracic and Lumbar rotation (30 degrees):       Hip Flexion (0-125):      Hip IR (0-45):     Hip ER (0-45):     Hip Adduction:      Hip Abduction (0-40):     Hip extension (0-15):     (*= pain, Blank rows = not tested)   STRENGTH: MMT  Deferred 2/2 time constraints   RLE  LLE   Hip Flexion    Hip Extension    Hip Abduction      Hip Adduction     Hip ER     Hip IR  Knee Extension    Knee Flexion    Dorsiflexion     Plantarflexion (seated)    (*= pain, Blank rows = not tested)   SPECIAL TESTS: Deferred 2/2 time constraints  Centralization and Peripheralization (SN 92, -LR 0.12):  Slump (SN 83, -LR 0.32):  SLR (SN 92, -LR 0.29): R: Lumbar quadrant (SN 70): R:  FABER (SN 81): FADIR (SN 94):  Hip scour (SN 50):  Thigh Thrust (SN 88, -LR 0.18) : Distraction (EN27):  Compression (SN/SP 69): Stork/March (SP 93):   PALPATION: Deferred 2/2 time constraints  Abdominal:  Diastasis:  finger above umbilicus,  fingers at and below umbilicus  Scar mobility: present/mobile perpendicular, parallel Rib flare: present/absent  EXTERNAL PELVIC EXAM: Patient educated on the purpose of the pelvic exam and articulated understanding; patient consented to the exam verbally. Deferred 2/2 time constraints  Palpation: Breath coordination: present/absent/inconsistent Voluntary Contraction: present/absent Relaxation: full/delayed/non-relaxing Perineal movement with sustained IAP increase ("bear down"): descent/no change/elevation/excessive descent Perineal movement with rapid IAP increase ("cough"): elevation/no change/descent Pubic symphysis: (0= no contraction, 1= flicker, 2= weak squeeze, 3= fair squeeze with lift, 4= good squeeze and lift against resistance, 5= strong squeeze against strong resistance)   INTERNAL PELVIC EXAM: Patient educated on the purpose of the pelvic exam and articulated understanding; patient consented to the exam verbally. Deferred 2/2 to time constraints Introitus Appears:  Skin integrity:  Scar mobility: Strength (PERF):  Symmetry: Palpation: Prolapse: (0= no contraction, 1= flicker, 2= weak squeeze, 3= fair squeeze with lift, 4= good squeeze and lift against resistance, 5= strong squeeze against strong resistance)    Patient Education:  Patient educated on what to expect during  course of physical therapy, POC, and provided with HEP including: toileting posture handout. Patient verbalized understanding and returned demonstration. Patient will benefit from further education in order to maximize compliance and understanding for long-term therapeutic gains.   Patient Surveys:  FOTO Urinary Problem - 38  FOTO PFDI Pain - 58     ASSESSMENT:  Clinical Impression: Patient is a 21 y.o. who was seen today for physical therapy evaluation and treatment for a chief concern of urinary leakage. Today's evaluation suggest deficits in IAP management, PFM strength, PFM coordination, PFM extensibility, pain, and scar mobility, as evidenced by worst groin pain, 8/10 (NPRS) with increased physical activity that is described as intermittent/occasionally sharp, urinary leakage with urge to void/walking to the bathroom/laughing, pain with BMs, on average Type 1 and Type 4 BMs (Bristol Stool Chart), feeling of incomplete bowel emptying, pain with annual GYN exams, discomfort with intercourse, and hx of multiple perineal tears during childbirth and one c-section. Patient's responses on FOTO Urinary Problem (58) indicates moderate limitation/disability/distress. Patient's progress may be limited due to demanding nature of motherhood; however, patient's motivation is advantageous. Pt with basic understanding of PFM function in bowel/bladder habits, sexual function, pain, and the deep core. Patient will benefit from skilled therapeutic intervention to address deficits in IAP management, PFM strength, PFM coordination, PFM extensibility, pain, and scar mobility in order to increase PLOF and improve overall QOL.    Objective Impairments: decreased coordination, decreased endurance, decreased strength, increased fascial restrictions, improper body mechanics, postural dysfunction, and pain.   Activity Limitations: carrying, continence, toileting, locomotion level, and caring for others  Personal  Factors: Behavior pattern, Past/current experiences, and 1-2 comorbidities: GERD and hx of eating disorder  are also affecting patient's functional outcome.   Rehab Potential: Good  Clinical Decision Making: Evolving/moderate complexity  Evaluation Complexity: Moderate  GOALS: Goals reviewed with patient? Yes  SHORT TERM GOALS: Target date: 03/24/2022  Patient will decrease intake of caffeinated beverages and other bladder irritants to less than or equal to 5 cups in order to manage urinary frequency and bladder irritation for improved overall QOL and participation at home and in the community. Baseline: up to 10 cups of coffee per day Goal status: INITIAL    LONG TERM GOALS: Target date: 04/28/2022   Patient will demonstrate circumferential and sequential contraction of >3/5 MMT, > 5 sec hold x5 and 5 consecutive quick flicks with </= 10 min rest between testing bouts, and relaxation of the PFM coordinated with breath for improved management of intra-abdominal pressure and normal bowel and bladder function without the presence of pain nor incontinence in order to improve participation at home and in the community. Baseline: will assess next visit  Goal status: INITIAL  2.  Patient will demonstrate coordinated lengthening and relaxation of PFM with diaphragmatic inhalation in order to decrease spasm and allow for unrestricted elimination of urine/feces for improved overall QOL. Baseline: has pain with BM, occasional strain (Type 1 and 4) Goal status: INITIAL  3.  Patient will report less than 5 incidents of stress urinary incontinence over the course of 3 weeks while walking to the bathroom/urge to void/laughing in order to demonstrate improved PFM coordination, strength, and function for improved overall QOL. Baseline: urinary leakage with all the above Goal status: INITIAL  4.  Patient will report being able to return to activities including, but not limited to: penetrative sex,  annual GYN exams, and physical activity without pain or limitation to indicate complete resolution of the chief complaint and return to prior level of participation at home and in the community. Baseline: discomfort with penetrative sex, pain with GYN exams and groin pain with increased physical activity  Goal status: INITIAL  5.  Patient will decrease worst pain as reported on NPRS by at least 2 points to demonstrate clinically significant reduction in pain in order to restore/improve function and overall QOL. Baseline: 8/10 L groin Goal status: INITIAL  6.  Patient will score  >/= 64 on FOTO Urinary Problem  in order to demonstrate improved IAP management, improved PFM coordination, improved PFM strength and overall QOL.  Baseline: 58 Goal status: INITIAL  PLAN: PT Frequency: 1x/week  PT Duration: 10 weeks  Planned Interventions: Therapeutic exercises, Therapeutic activity, Neuromuscular re-education, Balance training, Gait training, Patient/Family education, Self Care, Joint mobilization, Spinal mobilization, Cryotherapy, Moist heat, scar mobilization, Taping, and Manual therapy  Plan For Next Session: phy assess, scar mobs    Aleyssa Pike, PT, DPT  02/17/2022, 12:33 PM

## 2022-02-22 ENCOUNTER — Other Ambulatory Visit: Payer: Self-pay | Admitting: Nurse Practitioner

## 2022-02-22 DIAGNOSIS — E0590 Thyrotoxicosis, unspecified without thyrotoxic crisis or storm: Secondary | ICD-10-CM

## 2022-02-22 DIAGNOSIS — E041 Nontoxic single thyroid nodule: Secondary | ICD-10-CM

## 2022-02-22 DIAGNOSIS — R7989 Other specified abnormal findings of blood chemistry: Secondary | ICD-10-CM

## 2022-02-23 ENCOUNTER — Ambulatory Visit: Payer: Medicaid Other

## 2022-02-23 ENCOUNTER — Other Ambulatory Visit
Admission: RE | Admit: 2022-02-23 | Discharge: 2022-02-23 | Disposition: A | Discharge status: Home / Self Care | Payer: Medicaid Other | Source: Home / Self Care | Attending: Nurse Practitioner | Admitting: Nurse Practitioner

## 2022-02-23 DIAGNOSIS — M6289 Other specified disorders of muscle: Secondary | ICD-10-CM

## 2022-02-23 DIAGNOSIS — M6281 Muscle weakness (generalized): Secondary | ICD-10-CM

## 2022-02-23 DIAGNOSIS — E0590 Thyrotoxicosis, unspecified without thyrotoxic crisis or storm: Secondary | ICD-10-CM | POA: Insufficient documentation

## 2022-02-23 DIAGNOSIS — E041 Nontoxic single thyroid nodule: Secondary | ICD-10-CM | POA: Insufficient documentation

## 2022-02-23 DIAGNOSIS — R7989 Other specified abnormal findings of blood chemistry: Secondary | ICD-10-CM | POA: Insufficient documentation

## 2022-02-23 DIAGNOSIS — R278 Other lack of coordination: Secondary | ICD-10-CM

## 2022-02-23 LAB — T4, FREE: Free T4: 0.66 ng/dL (ref 0.61–1.12)

## 2022-02-23 LAB — TSH: TSH: <0.01 u[IU]/mL — ABNORMAL LOW (ref 0.350–4.500)

## 2022-02-23 NOTE — Therapy (Signed)
OUTPATIENT PHYSICAL THERAPY FEMALE PELVIC TREATMENT    Patient Name: Sara Henson MRN: [id number] DOB:October 05, 2000, 21 y.o., female Today's Date: 02/23/2022   PT End of Session - 02/23/22 0852     Visit Number 2    Number of Visits 10    Date for PT Re-Evaluation 04/28/22    Authorization Type IE: 02/17/22    PT Start Time 0851    PT Stop Time 0930    PT Time Calculation (min) 39 min    Activity Tolerance Patient tolerated treatment well             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   Had inpatient treatment at [facility] perinatal pysch   Past Surgical History:  Procedure Laterality Date   CESAREAN SECTION N/A 09/28/2019   Procedure: CESAREAN SECTION;  Surgeon: Malachy Mood, MD;  Location: [facility] ORS;  Service: Obstetrics;  Laterality: N/A;  GEX:[id number] apgar-8,9 weight 9lbs 11oz   COLPOSCOPY     TONSILLECTOMY     TYMPANOSTOMY TUBE PLACEMENT     Patient Active Problem List   Diagnosis Date Noted   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   History of eating disorder 10/07/2021   History of macrosomia in infant in prior pregnancy, currently pregnant 10/07/2021   PMDD (premenstrual dysphoric disorder) 10/06/2020   Thyroid nodule 08/22/2019   Subclinical hypothyroidism 08/14/2019   GERD without esophagitis 04/30/2016   Disordered eating 01/22/2013   Murmur 02/08/2012   Tachycardia 02/08/2012    PCP: [place]  REFERRING PROVIDER: Dominic, Nunzio Cobbs, CNM   REFERRING DIAG:  Z39.1 (ICD-10-CM) - Care and examination of lactating mother  N39.498 (ICD-10-CM) - Other urinary incontinence   THERAPY DIAG:  Pelvic floor dysfunction  Other lack of coordination  Muscle weakness (generalized)   Rationale for Evaluation and Treatment: Rehabilitation  ONSET  DATE: After second pregnancy                                                                                         PRECAUTIONS: None  WEIGHT BEARING RESTRICTIONS: No  FALLS:  Has patient fallen in last 6 months? Yes. Number of falls 9 months pregnant - fell on hands and knees   OCCUPATION/SOCIAL ACTIVITIES: Stay at home, playing outdoors, shopping, weightlifting/HIIT, walking   PLOF: Independent   LIVING ENVIRONMENT: Lives with: lives with their family Lives in: House/apartment               CHIEF CONCERN: Pt gave birth Dec 23, 2021 and feels that she cannot control her bladder as much after this pregnancy. Pt noticed one night waking up and having soaked herself with urine. Pt also has urinary leakage with walking to the bathroom/laughing. Pt has had a frequency of UTIs since childhood (every other month to every 3 months now) and even some during pregnancy. Pt reports intermittent  L groin pain for many years but is worse with increased physical activity as well as LBP with increased activity.  PAIN:  Are you having pain? Yes NPRS scale: L adductor and occasionally radiates towards vagina, 8/10 (worst), 0/10 (current), intermittent   Aggravating factors: after increased physical activity     PATIENT GOALS: Pt would like to be stronger and strengthen the deep core postpartum, being able to control urinary, leakage, and to continue with education    UROLOGICAL HISTORY Fluid intake: water, 10 cups of coffee (stops at 7pm but depends on day), 3 cups per day   Pain with urination: No Fully empty bladder: Yes Stream: Strong Urgency: No Frequency: 4-5x Nocturia: 0x  Leakage: Urge to void, Walking to the bathroom, and Laughing Pads: No  Amount: 3x/week having to change undergarments Bladder control (0-10): 6/10   GASTROINTESTINAL HISTORY Pain with bowel movement: Yes Type of bowel movement:Type (Bristol Stool Scale) between Type 1 and 4  and Strain Yes Frequency:  1-2x/day  Fully empty rectum: occasionally, bloated  Leakage: No   SEXUAL HISTORY/FUNCTION Pain with intercourse: discomfort, tenderness after intercourse  Ability to have vaginal penetration:  Yes; Deep thrusting: Yes Able to achieve orgasm?: Yes  OBSTETRICAL HISTORY Vaginal deliveries: G2P2  Tearing: Grade 2 - stitches with second birth C-section deliveries: first delivery  Currently pregnant: No  GYNECOLOGICAL HISTORY Hysterectomy: no  Pelvic Organ Prolapse: None Pain with exam: yes  Heaviness/pressure: no    SUBJECTIVE: Patient confirms identification and approves PT to assess pelvic floor and treatment Yes                  OBJECTIVE:   COGNITION: Overall cognitive status: Within functional limits for tasks assessed     POSTURE:   Iliac crest height: L iliac crest higher   RANGE OF MOTION:    (Norm range in degrees)  LEFT 02/23/22 RIGHT 02/23/22  Lumbar forward flexion (65):  WNL    Lumbar extension (30): WNL    Lumbar lateral flexion (25):  WNL WNL  Thoracic and Lumbar rotation (30 degrees):    WNL WNL  Hip Flexion (0-125):   WNL WNL  Hip IR (0-45):  WNL WNL  Hip ER (0-45):  WNL WNL  Hip Adduction:      Hip Abduction (0-40):  WNL WNL  Hip extension (0-15):     (*= pain, Blank rows = not tested)   STRENGTH: MMT    RLE 02/23/22 LLE 02/23/22  Hip Flexion 5 5  Hip Extension    Hip Abduction     Hip Adduction     Hip ER  5 5*  Hip IR  5 5  Knee Extension 5 5  Knee Flexion 5 5  Dorsiflexion     Plantarflexion (seated) 5 5  (*= pain, Blank rows = not tested)   SPECIAL TESTS:  FABER (SN 81): negative B FADIR (SN 94): negative B   PALPATION: Deferred 2/2 time constraints  Abdominal:  Diastasis:  1.5 fingers above umbilicus, 1.5 fingers at umbilicus, and 1 below  Rib flare: none   -No tenderness upon palpation of upper and lower quadrants   EXTERNAL PELVIC EXAM: Patient educated on the purpose of the pelvic exam and articulated  understanding; patient consented to the exam verbally.  Breath coordination: present but inconsistent Voluntary Contraction: present, 3/5 MMT  Relaxation: delayed  Perineal movement with sustained IAP increase ("bear down"): no change Perineal movement with rapid IAP increase ("cough"): no change Pubic symphysis: discomfort upon pressure applied  (0= no contraction, 1= flicker, 2= weak squeeze, 3= fair squeeze with lift, 4= good squeeze and  lift against resistance, 5= strong squeeze against strong resistance)    Neuromuscular Re-education: Discussion on breastfeeding positions in seated and sidelying in order to avoid increased pain or tension. Discussion on exhaling with movement especially when in semi-reclined position breastfeeding to avoid adverse events (DRA) and increased pressure at the pelvic floor.   Discussion on how diaphragmatic breathing can aid in downregulation of the nervous system and ultimately decreasing tension held in the upper trap especially as Pt carries the baby on the chest. Pt verbalized understanding.   Discussion on returning to physical activity as Pt expressed interest in return to core and LE workouts. DPT stated breathing will be the most important guidance to physical activity and form. Will review in future sessions.   Supine hooklying diaphragmatic breathing with VCs and TCs for downregulation of the nervous system and improved management of IAP  Supine hip adduction ball squeeze for pain modulation at pubic symphysis and provide stability, 10 x 3 sec hold, VCs and TCs required    Patient response to interventions: Pt surprised at how connected the diaphragm and pelvic floor were    Patient Education:  Patient provided with HEP including: supine diaphragmatic breathing and supine adduction ball squeeze. Patient educated throughout session on appropriate technique and form using multi-modal cueing, HEP, and activity modification. Patient will benefit  from further education in order to maximize compliance and understanding for long-term therapeutic gains.    ASSESSMENT:  Clinical Impression: Patient arrives to clinic with excellent motivation to participate in today's session. Upon physical assessment, Pt demonstrates deficits in IAP management, PFM strength, PFM coordination, PFM extensibility, pain, posture and scar mobility, as evidenced by increased L iliac crest height, elevated shoulder height but when cued to relax Pt able to demonstrate, pain with L hip ER MMT, present but inconsistent breath coordination, delayed PFM coordination, no change with sustained IAP increase ("bear down") and observed Valsalva, and discomfort with palpation of the pubic symphysis. Discussion on body mechanics during breastfeeding and will continue to practice lifting mechanics to avoid lower back strains. Pt required moderate VCs and TCs during active interventions for proper technique. Pt responded positively to active and educational interventions. Patient will benefit from skilled therapeutic intervention to address deficits in IAP management, PFM strength, PFM coordination, PFM extensibility, posture, pain, and scar mobility in order to increase PLOF and improve overall QOL.    Objective Impairments: decreased coordination, decreased endurance, decreased strength, increased fascial restrictions, improper body mechanics, postural dysfunction, and pain.   Activity Limitations: carrying, continence, toileting, locomotion level, and caring for others  Personal Factors: Behavior pattern, Past/current experiences, and 1-2 comorbidities: GERD and hx of eating disorder  are also affecting patient's functional outcome.   Rehab Potential: Good  Clinical Decision Making: Evolving/moderate complexity  Evaluation Complexity: Moderate   GOALS: Goals reviewed with patient? Yes  SHORT TERM GOALS: Target date: 03/24/2022  Patient will decrease intake of  caffeinated beverages and other bladder irritants to less than or equal to 5 cups in order to manage urinary frequency and bladder irritation for improved overall QOL and participation at home and in the community. Baseline: up to 10 cups of coffee per day Goal status: INITIAL    LONG TERM GOALS: Target date: 04/28/2022   Patient will demonstrate circumferential and sequential contraction of >3/5 MMT, > 5 sec hold x5 and 5 consecutive quick flicks with </= 10 min rest between testing bouts, and relaxation of the PFM coordinated with breath for improved management of intra-abdominal pressure  and normal bowel and bladder function without the presence of pain nor incontinence in order to improve participation at home and in the community. Baseline: 3/5 MMT  Goal status: INITIAL  2.  Patient will demonstrate coordinated lengthening and relaxation of PFM with diaphragmatic inhalation in order to decrease spasm and allow for unrestricted elimination of urine/feces for improved overall QOL. Baseline: has pain with BM, occasional strain (Type 1 and 4)/no change in PFM and Valsalva noted Goal status: INITIAL  3.  Patient will report less than 5 incidents of stress urinary incontinence over the course of 3 weeks while walking to the bathroom/urge to void/laughing in order to demonstrate improved PFM coordination, strength, and function for improved overall QOL. Baseline: urinary leakage with all the above Goal status: INITIAL  4.  Patient will report being able to return to activities including, but not limited to: penetrative sex, annual GYN exams, and physical activity without pain or limitation to indicate complete resolution of the chief complaint and return to prior level of participation at home and in the community. Baseline: discomfort with penetrative sex, pain with GYN exams and groin pain with increased physical activity  Goal status: INITIAL  5.  Patient will decrease worst pain as reported  on NPRS by at least 2 points to demonstrate clinically significant reduction in pain in order to restore/improve function and overall QOL. Baseline: 8/10 L groin Goal status: INITIAL  6.  Patient will score  >/= 64 on FOTO Urinary Problem  in order to demonstrate improved IAP management, improved PFM coordination, improved PFM strength and overall QOL.  Baseline: 58 Goal status: INITIAL  PLAN: PT Frequency: 1x/week  PT Duration: 10 weeks  Planned Interventions: Therapeutic exercises, Therapeutic activity, Neuromuscular re-education, Balance training, Gait training, Patient/Family education, Self Care, Joint mobilization, Spinal mobilization, Cryotherapy, Moist heat, scar mobilization, Taping, and Manual therapy  Plan For Next Session: scar mob talk about, PFM lengthen techniques, lifting mechanics/body mechanics    Rose Hippler, PT, DPT  02/23/2022, 10:18 AM

## 2022-02-24 LAB — T3, FREE: T3, Free: 4.9 pg/mL — ABNORMAL HIGH (ref 2.0–4.4)

## 2022-03-02 ENCOUNTER — Ambulatory Visit (INDEPENDENT_AMBULATORY_CARE_PROVIDER_SITE_OTHER): Payer: Medicaid Other | Admitting: Nurse Practitioner

## 2022-03-02 ENCOUNTER — Encounter: Payer: Self-pay | Admitting: Nurse Practitioner

## 2022-03-02 ENCOUNTER — Ambulatory Visit: Payer: Medicaid Other

## 2022-03-02 DIAGNOSIS — R278 Other lack of coordination: Secondary | ICD-10-CM

## 2022-03-02 DIAGNOSIS — M6289 Other specified disorders of muscle: Secondary | ICD-10-CM

## 2022-03-02 DIAGNOSIS — M6281 Muscle weakness (generalized): Secondary | ICD-10-CM

## 2022-03-02 DIAGNOSIS — E041 Nontoxic single thyroid nodule: Secondary | ICD-10-CM

## 2022-03-02 DIAGNOSIS — R7989 Other specified abnormal findings of blood chemistry: Secondary | ICD-10-CM

## 2022-03-02 NOTE — Progress Notes (Signed)
03/02/2022     Endocrinology Follow Up Note    Subjective:    Patient ID: Sara Henson, female    DOB: [birth year], PCP [facility]   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   Had inpatient treatment at [facility] perinatal pysch    Past Surgical History:  Procedure Laterality Date   CESAREAN SECTION N/A 09/28/2019   Procedure: CESAREAN SECTION;  Surgeon: Vena Austria, MD;  Location: [facility] ORS;  Service: Obstetrics;  Laterality: N/A;  NTI:[id number] apgar-8,9 weight 9lbs 11oz   COLPOSCOPY     TONSILLECTOMY     TYMPANOSTOMY TUBE PLACEMENT      Social History   Socioeconomic History   Marital status: Married    Spouse name: Gardiner Barefoot   Number of children: 0   Years of education: 13   Highest education level: High school graduate  Occupational History   Occupation: Unemployed  Tobacco Use   Smoking status: Never   Smokeless tobacco: Former    Types: Associate Professor Use: Former   Devices: quit 2020  Substance and Sexual Activity   Alcohol use: Not Currently   Drug use: No   Sexual activity: Yes  Other Topics Concern   Not on file  Social History Narrative   Not on file   Social Determinants of Health   Financial Resource Strain: Not on file  Food Insecurity: Not on file  Transportation Needs: Not on file  Physical Activity: Not on file  Stress: Not on file  Social Connections: Not on file    Family History  Problem Relation Age of Onset   Skin cancer Father     Outpatient Encounter Medications as of 03/02/2022  Medication Sig   FLUoxetine (PROZAC) 20 MG capsule Take 80 mg by mouth every morning.   No facility-administered encounter medications on file as of 03/02/2022.    ALLERGIES: No Known Allergies  VACCINATION  STATUS: Immunization History  Administered Date(s) Administered   Influenza,inj,Quad PF,6+ Mos 03/07/2019   Tdap 07/16/2019, 10/07/2021     HPI  Sara Henson is 21 y.o. female who presents today with a medical history as above. she is being seen in follow up after being seen in consultation for hyperthyroidism requested by [facility]  she has been dealing with symptoms of anxiety, insomnia, intermittent palpitations on and off for 2 years (starting in 2020/2021). These symptoms are progressively worsening and troubling to her.    she does complain of feeling lump in left neck, which is worse when laying down on her side at night making it uncomfortable to swallow at times.  She denies choking, shortness of breath, no recent voice change. She notes she had similar problems during her first pregnancy.   she denies family history of thyroid dysfunction and denies family hx of thyroid cancer. she denies personal history of goiter. she is not on any anti-thyroid medications nor on any thyroid hormone supplements. Denies use of Biotin containing supplements, she notes she was on a prenatal vitamin but she has since stopped it since her son was born 3 weeks ago.  she is willing to proceed with appropriate work up and therapy for thyrotoxicosis.   Review of systems  Constitutional: + Minimally fluctuating body weight, current There is no height  or weight on file to calculate BMI., + fatigue, no subjective hyperthermia, no subjective hypothermia Eyes: no blurry vision, no xerophthalmia ENT: no sore throat, + nodules palpated in throat, no dysphagia/odynophagia, no hoarseness Cardiovascular: no chest pain, no shortness of breath, + intermittent palpitations, no leg swelling Respiratory: no cough, no shortness of breath Gastrointestinal: no nausea/vomiting/diarrhea Musculoskeletal: no muscle/joint aches Skin: no rashes, no hyperemia Neurological: no tremors, no numbness, no  tingling, no dizziness Psychiatric: no depression, + anxiety, + insomnia   Objective:    There were no vitals taken for this visit.  Wt Readings from Last 3 Encounters:  02/12/22 190 lb (86.2 kg)  01/18/22 192 lb 9.6 oz (87.4 kg)  01/08/22 192 lb (87.1 kg)     BP Readings from Last 3 Encounters:  02/12/22 119/82  01/18/22 116/74  01/08/22 122/70                        Physical Exam- Limited  Constitutional:  There is no height or weight on file to calculate BMI. , not in acute distress, normal state of mind Eyes:  EOMI, no exophthalmos Neck: Supple Thyroid: + gross goiter, L>R, palpable nodule on left gland Cardiovascular: RRR, + murmur, rubs, or gallops, no edema Respiratory: Adequate breathing efforts, no crackles, rales, rhonchi, or wheezing Musculoskeletal: no gross deformities, strength intact in all four extremities, no gross restriction of joint movements Skin:  no rashes, no hyperemia Neurological: no tremor with outstretched hands, DTR normal in BLE   CMP     Component Value Date/Time   NA 137 12/22/2021 0847   NA 139 12/15/2021 1107   K 3.5 12/22/2021 0847   CL 109 12/22/2021 0847   CO2 23 12/22/2021 0847   GLUCOSE 111 (H) 12/22/2021 0847   BUN 8 12/22/2021 0847   BUN 5 (L) 12/15/2021 1107   CREATININE 0.53 12/22/2021 0847   CALCIUM 9.0 12/22/2021 0847   PROT 6.0 (L) 12/22/2021 0847   PROT 5.7 (L) 12/15/2021 1107   ALBUMIN 3.1 (L) 12/22/2021 0847   ALBUMIN 3.7 (L) 12/15/2021 1107   AST 20 12/22/2021 0847   ALT 16 12/22/2021 0847   ALKPHOS 143 (H) 12/22/2021 0847   BILITOT 0.6 12/22/2021 0847   BILITOT 0.4 12/15/2021 1107   GFRNONAA >60 12/22/2021 0847   GFRAA >60 09/27/2019 2026     CBC    Component Value Date/Time   WBC 9.5 12/24/2021 0607   RBC 3.36 (L) 12/24/2021 0607   HGB 9.1 (L) 12/24/2021 0607   HGB 12.5 12/15/2021 1107   HCT 28.3 (L) 12/24/2021 0607   HCT 37.4 12/15/2021 1107   PLT 142 (L) 12/24/2021 0607   PLT 191 12/15/2021 1107    MCV 84.2 12/24/2021 0607   MCV 82 12/15/2021 1107   MCH 27.1 12/24/2021 0607   MCHC 32.2 12/24/2021 0607   RDW 15.3 12/24/2021 0607   RDW 14.4 12/15/2021 1107   LYMPHSABS 1.0 12/15/2021 1107   MONOABS 0.7 11/20/2021 1555   EOSABS 0.0 12/15/2021 1107   BASOSABS 0.0 12/15/2021 1107     Diabetic Labs (most recent): Lab Results  Component Value Date   HGBA1C 5.4 11/27/2021   HGBA1C 9.6 (A) 11/27/2021    Lipid Panel  No results found for: "CHOL", "TRIG", "HDL", "CHOLHDL", "VLDL", "LDLCALC", "LDLDIRECT", "LABVLDL"   Lab Results  Component Value Date   TSH <0.010 (L) 02/23/2022   TSH <0.005 (L) 01/18/2022   TSH 0.007 (L) 10/23/2021   TSH  0.077 (L) 11/13/2019   TSH 0.638 08/14/2019   TSH 0.737 08/14/2019   FREET4 0.66 02/23/2022   FREET4 1.85 (H) 01/18/2022   FREET4 0.56 (L) 08/14/2019     Thyroid US from 03/06/21 CLINICAL DATA:  21 year old female with a history of thyroid nodule   EXAM: THYROID ULTRASOUND   TECHNIQUE: Ultrasound examination of the thyroid gland and adjacent soft tissues was performed.   COMPARISON:  08/20/2019, 11/23/2019   FINDINGS: Parenchymal Echotexture: Normal   Isthmus: 0.2 cm   Right lobe: 4.3 cm x 1.0 cm x 1.2 cm   Left lobe: 5.2 cm x 1.6 cm x 2.5 cm   _________________________________________________________   Estimated total number of nodules >/= 1 cm: 1   Number of spongiform nodules >/=  2 cm not described below (TR1): 0   Number of mixed cystic and solid nodules >/= 1.5 cm not described below (TR2): 0   _________________________________________________________   Nodule within the left thyroid measured 3.2 cm on the baseline study 08/20/2019, and now measures 3.7 cm. This remains TR 2 with mixed cystic and solid components. No internal calcifications. Nodule does not meet criteria for further surveillance or biopsy   No additional nodules.   No adenopathy   Recommendations follow those established by the new ACR  TI-RADS criteria (J Am Coll Radiol 2017;14:587-595).   IMPRESSION: Unchanged appearance of the left-sided cystic/solid TR 2 thyroid nodule, previously 3.3 cm, currently 3.7 cm as above.     Electronically Signed   By: Gilmer MorJaime  Wagner D.O.   On: 03/06/2021 16:16   Thyroid US from 02/22/22 Heterogeneous thyroid.   3.2 cm TI-RADS category 2 nodule in the right thyroid, which does not meet criteria for biopsy.   ________________________________  Allene PyoI-RADS 1 (0 points): Benign- No FNA indication  TI-RADS 2 (2 points): Not suspicious- No FNA indicated  TI-RADS 3 (3 points): Mildly suspicious- FNA is > or = 2.5 cm, follow if > or = 1.5 cm  TI-RADS 4 (4-6 points): Moderately suspicious- FNA if > or = 1.5 or follow if > or = 1.0 cm  TI-RADS 5 (7 or more points): Highly suspicious- FNA if >=1.0 cm, follow if >=0.5 cm  NOTE:  The TI-RADS classification of thyroid nodules has been adopted to standardize risk stratification based on a common lexicon to inform practitioners about which nodules warrant biopsy.  The imaging criteria for TI-RADS criteria and documentation are available online at https://[email or web address].acr.org/Quality-Safety/Resources/TIRADS Narrative  EXAM: US THYROID  DATE: 02/22/2022  ACCESSION: [id number] UN  DICTATED: 02/22/2022 9:02 AM  [facility]   CLINICAL INDICATION: [age] years old Female with THYROID NODULE  - E04.1 - Thyroid nodule   COMPARISON: None   TECHNIQUE:  Ultrasound views of the thyroid were obtained using gray scale and limited color Doppler imaging.   FINDINGS:  Thyroid size:       Right thyroid: 3.9 x 1.4 x 1.2 cm       Left thyroid: 4.2 x 2.3 x 3.1 cm       Isthmus: 0.2 cm   Thyroid echotexture: Heterogenous   Nodule:    1  Size: 3.0 x 1.5 x 3.2 cm  Location: Right  Composition: mixed cystic and solid (1)  Echogenicity: Isoechoic (1)  Shape: Not taller than wide (0)  Margin: ill defined (0)  Echogenic foci: None (0)   ACR TI-RADS  total points: 2  ACR TI-RADS risk category: TI-RADS 2  ACR TI-RADS recommendation: No further follow up needed Procedure  Note  Olinger, Asa Lente, MD - 02/22/2022  Formatting of this note might be different from the original.  EXAM: US THYROID  DATE: 02/22/2022  ACCESSION: [id number] UN  DICTATED: 02/22/2022 9:02 AM  INTERPRETATION LOCATION: [facility]   CLINICAL INDICATION: 21 years old Female with THYROID NODULE  - E04.1 - Thyroid nodule   COMPARISON: None   TECHNIQUE:  Ultrasound views of the thyroid were obtained using gray scale and limited color Doppler imaging.   FINDINGS:  Thyroid size:       Right thyroid: 3.9 x 1.4 x 1.2 cm       Left thyroid: 4.2 x 2.3 x 3.1 cm       Isthmus: 0.2 cm   Thyroid echotexture: Heterogenous   Nodule:    1  Size: 3.0 x 1.5 x 3.2 cm  Location: Right  Composition: mixed cystic and solid (1)  Echogenicity: Isoechoic (1)  Shape: Not taller than wide (0)  Margin: ill defined (0)  Echogenic foci: None (0)   ACR TI-RADS total points: 2  ACR TI-RADS risk category: TI-RADS 2  ACR TI-RADS recommendation: No further follow up needed   IMPRESSION:  Heterogeneous thyroid.   3.2 cm TI-RADS category 2 nodule in the right thyroid, which does not meet criteria for biopsy.   ________________________________  Allene Pyo 1 (0 points): Benign- No FNA indication  TI-RADS 2 (2 points): Not suspicious- No FNA indicated  TI-RADS 3 (3 points): Mildly suspicious- FNA is > or = 2.5 cm, follow if > or = 1.5 cm  TI-RADS 4 (4-6 points): Moderately suspicious- FNA if > or = 1.5 or follow if > or = 1.0 cm  TI-RADS 5 (7 or more points): Highly suspicious- FNA if >=1.0 cm, follow if >=0.5 cm  NOTE:  The TI-RADS classification of thyroid nodules has been adopted to standardize risk stratification based on a common lexicon to inform practitioners about which nodules warrant biopsy.  The imaging criteria for TI-RADS criteria and documentation are  available online at [email or web address].arnold.com/ Exam End: 02/22/22 09:03   Specimen Collected: 02/22/22 09:02      Latest Reference Range & Units 08/14/19 09:37 11/13/19 15:41 10/23/21 11:54 01/18/22 14:38 02/23/22 09:56  TSH 0.350 - 4.500 uIU/mL 0.638 0.077 (L) 0.007 (L) <0.005 (L) <0.010 (L)  Triiodothyronine,Free,Serum 2.0 - 4.4 pg/mL    7.2 (H) 4.9 (H)  T4,Free(Direct) 0.61 - 1.12 ng/dL 5.18 (L)   8.41 (H) 6.60  T4,Free (Direct) 0.82 - 1.77 ng/dL   6.30    Thyroxine (T4) 4.5 - 12.0 ug/dL  5.8     Free Thyroxine Index 1.2 - 4.9   1.6     Thyroperoxidase Ab SerPl-aCnc 0 - 34 IU/mL    <9   Thyroglobulin Antibody 0.0 - 0.9 IU/mL    <1.0   T3 Uptake Ratio 24 - 39 %  28     (L): Data is abnormally low (H): Data is abnormally high  Assessment & Plan:   1. Thyroid nodule 2. Abnormal TSH 3. Hyperthyroidism  she is being seen at a kind request of [facility]  her history and most recent labs are reviewed, and she was examined clinically. Subjective and objective findings are inconsistent with thyrotoxicosis from primary hyperthyroidism. However, the potential risks of untreated thyrotoxicosis and the need for definitive therapy have been discussed in detail with her, and she agrees to proceed with diagnostic workup and treatment plan.   Her repeat thyroid function tests show improvement with still  slightly suppressed TSH and low normal Free T4 and slightly elevated Free T3 (also improving).  Her antibody testing was negative, ruling out autoimmune thyroid dysfunction.  She is not at a point where she needs antithyroid treatment nor thyroid hormone replacement.  She will need labs again in 2 months for surveillance.   Her repeat thyroid ultrasound still shows mixed cystic/solid nodule in right thyroid gland which still does not meet criteria for FNA.  This is her main concern, wants to know why it keeps growing, would like to have it sampled.  I did tell her  today that the biopsy would likely not be approved to move forward without it meeting criteria.  We did discuss the potential need to have uptake and scan if labs do not improve, but she is still breastfeeding.  The uptake and scan will be helpful to see if the nodule is toxic (producing too much hormone) and help guide her treatment moving forward.  She knows to call me back once she plans to stop breastfeeding so we can proceed with additional testing.  Options of therapy are discussed with her.  We discussed the option of treating it with medications including methimazole or PTU which may have side effects including rash, transaminitis, and bone marrow suppression.  We also discussed the option of definitive therapy with RAI ablation of the thyroid. If she is found to have primary hyperthyroidism from Graves' disease , toxic multinodular goiter or toxic nodular goiter the preferred modality of treatment would be I-131 thyroid ablation. Surgery is another choice of treatment in some cases, in her case surgery is not a good fit for presentation with only mild goiter.  -Patient is made aware of the high likelihood of post ablative hypothyroidism with subsequent need for lifelong thyroid hormone replacement. sheunderstands this outcome and she is  willing to proceed.        -Patient is advised to maintain close follow up with [facility] for primary care needs.    I spent 30 minutes in the care of the patient today including review of labs from Thyroid Function, CMP, and other relevant labs ; imaging/biopsy records (current and previous including abstractions from other facilities); face-to-face time discussing  her lab results and symptoms, medications doses, her options of short and long term treatment based on the latest standards of care / guidelines;   and documenting the encounter.  [name]  participated in the discussions, expressed understanding, and voiced agreement with  the above plans.  All questions were answered to her satisfaction. she is encouraged to contact clinic should she have any questions or concerns prior to her return visit.  Follow up plan: Return in about 2 months (around 05/02/2022) for Thyroid follow up, Previsit labs, Virtual visit ok.   Thank you for involving me in the care of this pleasant patient, and I will continue to update you with her progress.    Rayetta Pigg, [facility] [address] [place], [place] [address] Phone: [phone] Fax: [phone]  03/02/2022, 3:10 PM

## 2022-03-02 NOTE — Therapy (Signed)
OUTPATIENT PHYSICAL THERAPY FEMALE PELVIC TREATMENT    Patient Name: Sara Henson MRN: [id number] DOB:10-Jun-2000, 21 y.o., female Today's Date: 03/02/2022   PT End of Session - 03/02/22 0855     Visit Number 3    Number of Visits 10    Date for PT Re-Evaluation 04/28/22    Authorization Type IE: 02/17/22    PT Start Time 0855    PT Stop Time 0925    PT Time Calculation (min) 30 min    Activity Tolerance Patient tolerated treatment well             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   Had inpatient treatment at [facility] Sees perinatal pysch   Past Surgical History:  Procedure Laterality Date   CESAREAN SECTION N/A 09/28/2019   Procedure: CESAREAN SECTION;  Surgeon: Vena Austria, MD;  Location: [facility] ORS;  Service: Obstetrics;  Laterality: N/A;  ELF:[id number] apgar-8,9 weight 9lbs 11oz   COLPOSCOPY     TONSILLECTOMY     TYMPANOSTOMY TUBE PLACEMENT     Patient Active Problem List   Diagnosis Date Noted   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   History of eating disorder 10/07/2021   History of macrosomia in infant in prior pregnancy, currently pregnant 10/07/2021   PMDD (premenstrual dysphoric disorder) 10/06/2020   Thyroid nodule 08/22/2019   Subclinical hypothyroidism 08/14/2019   GERD without esophagitis 04/30/2016   Disordered eating 01/22/2013   Murmur 02/08/2012   Tachycardia 02/08/2012    PCP: [facility]  REFERRING PROVIDER: Dominic, Courtney Heys, CNM   REFERRING DIAG:  Z39.1 (ICD-10-CM) - Care and examination of lactating mother  N39.498 (ICD-10-CM) - Other urinary incontinence   THERAPY DIAG:  Pelvic floor dysfunction  Other lack of coordination  Muscle weakness (generalized)   Rationale for Evaluation and Treatment: Rehabilitation  ONSET  DATE: After second pregnancy                                                                                         PRECAUTIONS: None  WEIGHT BEARING RESTRICTIONS: No  FALLS:  Has patient fallen in last 6 months? Yes. Number of falls 9 months pregnant - fell on hands and knees   OCCUPATION/SOCIAL ACTIVITIES: Stay at home, playing outdoors, shopping, weightlifting/HIIT, walking   PLOF: Independent   LIVING ENVIRONMENT: Lives with: lives with their family Lives in: House/apartment               CHIEF CONCERN: Pt gave birth Dec 23, 2021 and feels that she cannot control her bladder as much after this pregnancy. Pt noticed one night waking up and having soaked herself with urine. Pt also has urinary leakage with walking to the bathroom/laughing. Pt has had a frequency of UTIs since childhood (every other month to every 3 months now) and even some during pregnancy. Pt reports intermittent  L groin pain for many years but is worse with increased physical activity as well as LBP with increased activity.  NPRS scale: L adductor and occasionally radiates towards vagina, 8/10 (worst), 0/10 (current), intermittent   Aggravating factors: after increased physical activity     PATIENT GOALS: Pt would like to be stronger and strengthen the deep core postpartum, being able to control urinary, leakage, and to continue with education    UROLOGICAL HISTORY Fluid intake: water, 10 cups of coffee (stops at 7pm but depends on day), 3 cups per day   Pain with urination: No Fully empty bladder: Yes Stream: Strong Urgency: No Frequency: 4-5x Nocturia: 0x  Leakage: Urge to void, Walking to the bathroom, and Laughing Pads: No  Amount: 3x/week having to change undergarments Bladder control (0-10): 6/10   GASTROINTESTINAL HISTORY Pain with bowel movement: Yes Type of bowel movement:Type (Bristol Stool Scale) between Type 1 and 4  and Strain Yes Frequency: 1-2x/day  Fully empty rectum:  occasionally, bloated  Leakage: No   SEXUAL HISTORY/FUNCTION Pain with intercourse: discomfort, tenderness after intercourse  Ability to have vaginal penetration:  Yes; Deep thrusting: Yes Able to achieve orgasm?: Yes  OBSTETRICAL HISTORY Vaginal deliveries: G2P2  Tearing: Grade 2 - stitches with second birth C-section deliveries: first delivery  Currently pregnant: No  GYNECOLOGICAL HISTORY Hysterectomy: no  Pelvic Organ Prolapse: None Pain with exam: yes  Heaviness/pressure: no    SUBJECTIVE:  Pt reports having increased pain in the L groin after session. The pain decreased with time. Pt has been performing some exercise (lunge and squats) and has modified due to L groin pain. Pt did not sleep too well last night.    PAIN:  Are you having pain? No NPRS scale: 0   OBJECTIVE:   COGNITION: Overall cognitive status: Within functional limits for tasks assessed     POSTURE:   Iliac crest height: L iliac crest higher   RANGE OF MOTION:    (Norm range in degrees)  LEFT 02/23/22 RIGHT 02/23/22  Lumbar forward flexion (65):  WNL    Lumbar extension (30): WNL    Lumbar lateral flexion (25):  WNL WNL  Thoracic and Lumbar rotation (30 degrees):    WNL WNL  Hip Flexion (0-125):   WNL WNL  Hip IR (0-45):  WNL WNL  Hip ER (0-45):  WNL WNL  Hip Adduction:      Hip Abduction (0-40):  WNL WNL  Hip extension (0-15):     (*= pain, Blank rows = not tested)   STRENGTH: MMT    RLE 02/23/22 LLE 02/23/22  Hip Flexion 5 5  Hip Extension    Hip Abduction     Hip Adduction     Hip ER  5 5*  Hip IR  5 5  Knee Extension 5 5  Knee Flexion 5 5  Dorsiflexion     Plantarflexion (seated) 5 5  (*= pain, Blank rows = not tested)   SPECIAL TESTS:  FABER (SN 81): negative B FADIR (SN 94): negative B   PALPATION:  Abdominal:  Diastasis:  1.5 fingers above umbilicus, 1.5 fingers at umbilicus, and 1 below  Rib flare: none   -No tenderness upon palpation of upper and lower  quadrants    EXTERNAL PELVIC EXAM: Patient educated on the purpose of the pelvic exam and articulated understanding; patient consented to the exam verbally.  Breath coordination: present but inconsistent Voluntary Contraction: present, 3/5 MMT  Relaxation: delayed  Perineal movement with sustained IAP increase ("bear down"): no change Perineal movement with rapid IAP increase ("cough"): no change Pubic symphysis: discomfort upon pressure applied  (  0= no contraction, 1= flicker, 2= weak squeeze, 3= fair squeeze with lift, 4= good squeeze and lift against resistance, 5= strong squeeze against strong resistance)   TODAY'S TREATMENT   Mnaual Therapy: STM at adductor musculature with Pt in hooklying and modified butterfly pose for improved tissue extensibility and pain modulation  Initially 6/10 but improved with time coming down to 3/10   Neuromuscular Re-education: Supine hooklying diaphragmatic breathing with VCs and TCs for downregulation of the nervous system and improved management of IAP   Supine hooklying PFM lengthening techniques with diaphragmatic breathing, VCs and TCs as needed  Butterfly pose "adductor stretch"   Right sidelying thoracic rotations, x10, for improved lengthening of the anterior fascial slings    Patient response to interventions: Pt felt better after manual intervention    Patient Education:  Patient provided with HEP including: supine diaphragmatic breathing and supine adduction ball squeeze. Patient educated throughout session on appropriate technique and form using multi-modal cueing, HEP, and activity modification. Patient will benefit from further education in order to maximize compliance and understanding for long-term therapeutic gains.    ASSESSMENT:  Clinical Impression: Patient arrives to clinic with excellent motivation to participate in today's session. Pt continues to demonstrate deficits in IAP management, PFM strength, PFM coordination,  PFM extensibility, pain, posture and scar mobility. Pt with increased L groin pain after last session. Gentle adduction ball squeeze eliminated from HEP. Pt with 6/10 pain upon palpation of adductor musculature in supine hooklying. Pt with decreased pain (3/10) as manual intervention continued. Pt required moderate VCs and TCs  for active interventions for modifications and to decrease bodily compensations. Pt responded positively to active and educational interventions. Patient will benefit from skilled therapeutic intervention to address deficits in IAP management, PFM strength, PFM coordination, PFM extensibility, posture, pain, and scar mobility in order to increase PLOF and improve overall QOL.    Objective Impairments: decreased coordination, decreased endurance, decreased strength, increased fascial restrictions, improper body mechanics, postural dysfunction, and pain.   Activity Limitations: carrying, continence, toileting, locomotion level, and caring for others  Personal Factors: Behavior pattern, Past/current experiences, and 1-2 comorbidities: GERD and hx of eating disorder  are also affecting patient's functional outcome.   Rehab Potential: Good  Clinical Decision Making: Evolving/moderate complexity  Evaluation Complexity: Moderate   GOALS: Goals reviewed with patient? Yes  SHORT TERM GOALS: Target date: 03/24/2022  Patient will decrease intake of caffeinated beverages and other bladder irritants to less than or equal to 5 cups in order to manage urinary frequency and bladder irritation for improved overall QOL and participation at home and in the community. Baseline: up to 10 cups of coffee per day Goal status: INITIAL    LONG TERM GOALS: Target date: 04/28/2022   Patient will demonstrate circumferential and sequential contraction of >3/5 MMT, > 5 sec hold x5 and 5 consecutive quick flicks with </= 10 min rest between testing bouts, and relaxation of the PFM coordinated  with breath for improved management of intra-abdominal pressure and normal bowel and bladder function without the presence of pain nor incontinence in order to improve participation at home and in the community. Baseline: 3/5 MMT  Goal status: INITIAL  2.  Patient will demonstrate coordinated lengthening and relaxation of PFM with diaphragmatic inhalation in order to decrease spasm and allow for unrestricted elimination of urine/feces for improved overall QOL. Baseline: has pain with BM, occasional strain (Type 1 and 4)/no change in PFM and Valsalva noted Goal status: INITIAL  3.  Patient will report less than 5 incidents of stress urinary incontinence over the course of 3 weeks while walking to the bathroom/urge to void/laughing in order to demonstrate improved PFM coordination, strength, and function for improved overall QOL. Baseline: urinary leakage with all the above Goal status: INITIAL  4.  Patient will report being able to return to activities including, but not limited to: penetrative sex, annual GYN exams, and physical activity without pain or limitation to indicate complete resolution of the chief complaint and return to prior level of participation at home and in the community. Baseline: discomfort with penetrative sex, pain with GYN exams and groin pain with increased physical activity  Goal status: INITIAL  5.  Patient will decrease worst pain as reported on NPRS by at least 2 points to demonstrate clinically significant reduction in pain in order to restore/improve function and overall QOL. Baseline: 8/10 L groin Goal status: INITIAL  6.  Patient will score  >/= 64 on FOTO Urinary Problem  in order to demonstrate improved IAP management, improved PFM coordination, improved PFM strength and overall QOL.  Baseline: 58 Goal status: INITIAL  PLAN: PT Frequency: 1x/week  PT Duration: 10 weeks  Planned Interventions: Therapeutic exercises, Therapeutic activity, Neuromuscular  re-education, Balance training, Gait training, Patient/Family education, Self Care, Joint mobilization, Spinal mobilization, Cryotherapy, Moist heat, scar mobilization, Taping, and Manual therapy  Plan For Next Session:  scar mob talk about (c-section), breathing in diff positions, PFM lengthen techniques, lifting mechanics/body mechanics    Lexington Krotz, PT, DPT  03/02/2022, 8:55 AM

## 2022-03-09 ENCOUNTER — Ambulatory Visit: Payer: Medicaid Other

## 2022-03-16 ENCOUNTER — Ambulatory Visit: Payer: Medicaid Other | Attending: Licensed Practical Nurse

## 2022-03-16 ENCOUNTER — Telehealth: Payer: Self-pay

## 2022-03-16 DIAGNOSIS — M6289 Other specified disorders of muscle: Secondary | ICD-10-CM | POA: Diagnosis not present

## 2022-03-16 DIAGNOSIS — M6281 Muscle weakness (generalized): Secondary | ICD-10-CM | POA: Diagnosis present

## 2022-03-16 DIAGNOSIS — R278 Other lack of coordination: Secondary | ICD-10-CM | POA: Insufficient documentation

## 2022-03-16 MED ORDER — DICLOXACILLIN SODIUM 500 MG PO CAPS: 500.0000 mg | ORAL_CAPSULE | Freq: Four times a day (QID) | ORAL | 0 refills | Status: DC

## 2022-03-16 NOTE — Therapy (Signed)
OUTPATIENT PHYSICAL THERAPY FEMALE PELVIC TREATMENT    Patient Name: Sara Henson MRN: [id number] DOB:12-09-2000, 21 y.o., female Today's Date: 03/16/2022   PT End of Session - 03/16/22 0847     Visit Number 4    Number of Visits 10    Date for PT Re-Evaluation 04/28/22    Authorization Type IE: 02/17/22    PT Start Time 0845    PT Stop Time 0930    PT Time Calculation (min) 45 min    Activity Tolerance Patient tolerated treatment well             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   Had inpatient treatment at [facility] Sees perinatal pysch   Past Surgical History:  Procedure Laterality Date   CESAREAN SECTION N/A 09/28/2019   Procedure: CESAREAN SECTION;  Surgeon: Vena Austria, MD;  Location: [facility] ORS;  Service: Obstetrics;  Laterality: N/A;  CWC:[id number] apgar-8,9 weight 9lbs 11oz   COLPOSCOPY     TONSILLECTOMY     TYMPANOSTOMY TUBE PLACEMENT     Patient Active Problem List   Diagnosis Date Noted   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   History of eating disorder 10/07/2021   History of macrosomia in infant in prior pregnancy, currently pregnant 10/07/2021   PMDD (premenstrual dysphoric disorder) 10/06/2020   Thyroid nodule 08/22/2019   Subclinical hypothyroidism 08/14/2019   GERD without esophagitis 04/30/2016   Disordered eating 01/22/2013   Murmur 02/08/2012   Tachycardia 02/08/2012    PCP: [facility]  REFERRING PROVIDER: Dominic, Courtney Heys, CNM   REFERRING DIAG:  Z39.1 (ICD-10-CM) - Care and examination of lactating mother  N39.498 (ICD-10-CM) - Other urinary incontinence   THERAPY DIAG:  Pelvic floor dysfunction  Other lack of coordination  Muscle weakness (generalized)   Rationale for Evaluation and Treatment: Rehabilitation  ONSET  DATE: After second pregnancy                                                                                         PRECAUTIONS: None  WEIGHT BEARING RESTRICTIONS: No  FALLS:  Has patient fallen in last 6 months? Yes. Number of falls 9 months pregnant - fell on hands and knees   OCCUPATION/SOCIAL ACTIVITIES: Stay at home, playing outdoors, shopping, weightlifting/HIIT, walking   PLOF: Independent   LIVING ENVIRONMENT: Lives with: lives with their family Lives in: House/apartment               CHIEF CONCERN: Pt gave birth Dec 23, 2021 and feels that she cannot control her bladder as much after this pregnancy. Pt noticed one night waking up and having soaked herself with urine. Pt also has urinary leakage with walking to the bathroom/laughing. Pt has had a frequency of UTIs since childhood (every other month to every 3 months now) and even some during pregnancy. Pt reports intermittent  L groin pain for many years but is worse with increased physical activity as well as LBP with increased activity.  NPRS scale: L adductor and occasionally radiates towards vagina, 8/10 (worst), 0/10 (current), intermittent   Aggravating factors: after increased physical activity     PATIENT GOALS: Pt would like to be stronger and strengthen the deep core postpartum, being able to control urinary, leakage, and to continue with education    UROLOGICAL HISTORY Fluid intake: water, 10 cups of coffee (stops at 7pm but depends on day), 3 cups per day   Pain with urination: No Fully empty bladder: Yes Stream: Strong Urgency: No Frequency: 4-5x Nocturia: 0x  Leakage: Urge to void, Walking to the bathroom, and Laughing Pads: No  Amount: 3x/week having to change undergarments Bladder control (0-10): 6/10   GASTROINTESTINAL HISTORY Pain with bowel movement: Yes Type of bowel movement:Type (Bristol Stool Scale) between Type 1 and 4  and Strain Yes Frequency: 1-2x/day  Fully empty rectum:  occasionally, bloated  Leakage: No   SEXUAL HISTORY/FUNCTION Pain with intercourse: discomfort, tenderness after intercourse  Ability to have vaginal penetration:  Yes; Deep thrusting: Yes Able to achieve orgasm?: Yes  OBSTETRICAL HISTORY Vaginal deliveries: G2P2  Tearing: Grade 2 - stitches with second birth C-section deliveries: first delivery  Currently pregnant: No  GYNECOLOGICAL HISTORY Hysterectomy: no  Pelvic Organ Prolapse: None Pain with exam: yes  Heaviness/pressure: no    SUBJECTIVE:  Pt is doing well and been able to return to physical activity with weighted squats, bicep curls, overhead press. Pt has had minimal back pain and has learned to stop activity if it causes increased pain. Pt with some soreness in the lower back to the L.    PAIN:  Are you having pain? No NPRS scale: 0   OBJECTIVE:   COGNITION: Overall cognitive status: Within functional limits for tasks assessed     POSTURE:   Iliac crest height: L iliac crest higher   RANGE OF MOTION:    (Norm range in degrees)  LEFT 02/23/22 RIGHT 02/23/22  Lumbar forward flexion (65):  WNL    Lumbar extension (30): WNL    Lumbar lateral flexion (25):  WNL WNL  Thoracic and Lumbar rotation (30 degrees):    WNL WNL  Hip Flexion (0-125):   WNL WNL  Hip IR (0-45):  WNL WNL  Hip ER (0-45):  WNL WNL  Hip Adduction:      Hip Abduction (0-40):  WNL WNL  Hip extension (0-15):     (*= pain, Blank rows = not tested)   STRENGTH: MMT    RLE 02/23/22 LLE 02/23/22  Hip Flexion 5 5  Hip Extension    Hip Abduction     Hip Adduction     Hip ER  5 5*  Hip IR  5 5  Knee Extension 5 5  Knee Flexion 5 5  Dorsiflexion     Plantarflexion (seated) 5 5  (*= pain, Blank rows = not tested)   SPECIAL TESTS:  FABER (SN 81): negative B FADIR (SN 94): negative B   PALPATION:  Abdominal:  Diastasis:  1.5 fingers above umbilicus, 1.5 fingers at umbilicus, and 1 below  Rib flare: none   -No tenderness upon  palpation of upper and lower quadrants    EXTERNAL PELVIC EXAM: Patient educated on the purpose of the pelvic exam and articulated understanding; patient consented to the exam verbally.  Breath coordination: present but inconsistent Voluntary Contraction: present, 3/5 MMT  Relaxation: delayed  Perineal movement with sustained IAP increase ("bear down"): no change Perineal movement with rapid IAP increase ("cough"): no change Pubic  symphysis: discomfort upon pressure applied  (0= no contraction, 1= flicker, 2= weak squeeze, 3= fair squeeze with lift, 4= good squeeze and lift against resistance, 5= strong squeeze against strong resistance)   TODAY'S TREATMENT   Neuromuscular Re-education: Discussion on lifting mechanics/body mechanics with various stances (squat, lunge squat). DPT demonstrated with weighted ball then Pt practiced with 243 month old. VCs and TCs required Practiced from ground to standing  Practiced stance as if Pt was going to change diapers and placing into carseat or wagon   Seated diaphragmatic breathing with VCs and TCs for downregulation of the nervous system and improved management of IAP  Seated TrA activation with coordinated breath for improved IAP management, VCs and TCs required   Standing wall squat with coordinated breath, x10, for improved IAP management and more functional movement, VCs and TCs required  Discussion on next session modifying workout activities as Pt describes increased lower back pain. DPT will assess movement and modify as needed.   Discussion on length of time strength takes to build (6-8 weeks) especially postpartum. Visual demonstration of TrA musculature and if deconditioned, the lumbar spine musculature will replace its work. Pt verbalized understanding.   Patient response to interventions: Pt with some increased pain in the lumbar spine at end of session    Patient Education:  Patient provided with HEP including: seated  diaphragmatic breathing, standing wall squat, seated TrA activation. Patient educated throughout session on appropriate technique and form using multi-modal cueing, HEP, and activity modification. Patient will benefit from further education in order to maximize compliance and understanding for long-term therapeutic gains.    ASSESSMENT:  Clinical Impression: Patient arrives to clinic with excellent motivation to participate in today's session. Pt continues to demonstrate deficits in IAP management, PFM strength, PFM coordination, PFM extensibility, pain, posture and scar mobility. Pt with sorenss in the lumbar spine more towards the L more due to increased physical activity. After discussion, DPT advised Pt to avoid certain movements until able to discuss/modify in session as Pt may be placing increased pressure at the lumbar spine. Pt verbalized understanding. Time taken to discuss and practice lifting mechanics/body mechanics with breath in various positions to aid in movements experienced throughout motherhood. Pt practiced with 3215 lb 153 month old in session. Pt required moderate VCs and TCs  for TrA activation in various positions for modifications and to decrease bodily compensations. Pt responded positively to active and educational interventions. Patient will benefit from skilled therapeutic intervention to address deficits in IAP management, PFM strength, PFM coordination, PFM extensibility, posture, pain, and scar mobility in order to increase PLOF and improve overall QOL.    Objective Impairments: decreased coordination, decreased endurance, decreased strength, increased fascial restrictions, improper body mechanics, postural dysfunction, and pain.   Activity Limitations: carrying, continence, toileting, locomotion level, and caring for others  Personal Factors: Behavior pattern, Past/current experiences, and 1-2 comorbidities: GERD and hx of eating disorder  are also affecting patient's  functional outcome.   Rehab Potential: Good  Clinical Decision Making: Evolving/moderate complexity  Evaluation Complexity: Moderate   GOALS: Goals reviewed with patient? Yes  SHORT TERM GOALS: Target date: 03/24/2022  Patient will decrease intake of caffeinated beverages and other bladder irritants to less than or equal to 5 cups in order to manage urinary frequency and bladder irritation for improved overall QOL and participation at home and in the community. Baseline: up to 10 cups of coffee per day Goal status: INITIAL    LONG TERM GOALS: Target date:  04/28/2022   Patient will demonstrate circumferential and sequential contraction of >3/5 MMT, > 5 sec hold x5 and 5 consecutive quick flicks with </= 10 min rest between testing bouts, and relaxation of the PFM coordinated with breath for improved management of intra-abdominal pressure and normal bowel and bladder function without the presence of pain nor incontinence in order to improve participation at home and in the community. Baseline: 3/5 MMT  Goal status: INITIAL  2.  Patient will demonstrate coordinated lengthening and relaxation of PFM with diaphragmatic inhalation in order to decrease spasm and allow for unrestricted elimination of urine/feces for improved overall QOL. Baseline: has pain with BM, occasional strain (Type 1 and 4)/no change in PFM and Valsalva noted Goal status: INITIAL  3.  Patient will report less than 5 incidents of stress urinary incontinence over the course of 3 weeks while walking to the bathroom/urge to void/laughing in order to demonstrate improved PFM coordination, strength, and function for improved overall QOL. Baseline: urinary leakage with all the above Goal status: INITIAL  4.  Patient will report being able to return to activities including, but not limited to: penetrative sex, annual GYN exams, and physical activity without pain or limitation to indicate complete resolution of the chief  complaint and return to prior level of participation at home and in the community. Baseline: discomfort with penetrative sex, pain with GYN exams and groin pain with increased physical activity  Goal status: INITIAL  5.  Patient will decrease worst pain as reported on NPRS by at least 2 points to demonstrate clinically significant reduction in pain in order to restore/improve function and overall QOL. Baseline: 8/10 L groin Goal status: INITIAL  6.  Patient will score  >/= 64 on FOTO Urinary Problem  in order to demonstrate improved IAP management, improved PFM coordination, improved PFM strength and overall QOL.  Baseline: 58 Goal status: INITIAL  PLAN: PT Frequency: 1x/week  PT Duration: 10 weeks  Planned Interventions: Therapeutic exercises, Therapeutic activity, Neuromuscular re-education, Balance training, Gait training, Patient/Family education, Self Care, Joint mobilization, Spinal mobilization, Cryotherapy, Moist heat, scar mobilization, Taping, and Manual therapy  Plan For Next Session:  scar mob talk about (c-section), manual at scars, manual at back, go over stuff from last session, how did that go? How was lifting techniques? PFM lengthen techniques   Jerrold Haskell, PT, DPT  03/16/2022, 9:40 AM

## 2022-03-16 NOTE — Telephone Encounter (Signed)
Patient contacted office with concerns of mastitis in her right breast. Patient reports pain, redness, firmness and warmth to the touch when touching her skin. Patient reports that she has had mastitis before and that symptoms feel similar. Advised patient to continue to feed on baby's schedule and ensure proper latching, continue to feed on both breast and place cold compress to affected area.

## 2022-03-23 ENCOUNTER — Ambulatory Visit: Payer: Medicaid Other

## 2022-03-23 DIAGNOSIS — M6289 Other specified disorders of muscle: Secondary | ICD-10-CM

## 2022-03-23 DIAGNOSIS — M6281 Muscle weakness (generalized): Secondary | ICD-10-CM

## 2022-03-23 DIAGNOSIS — R278 Other lack of coordination: Secondary | ICD-10-CM

## 2022-03-23 NOTE — Therapy (Signed)
OUTPATIENT PHYSICAL THERAPY FEMALE PELVIC TREATMENT    Patient Name: Sara Henson MRN: [id number] DOB:[birth year], 21 y.o., female Today's Date: 03/23/2022   PT End of Session - 03/23/22 0934     Visit Number 5    Number of Visits 10    Date for PT Re-Evaluation 04/28/22    Authorization Type IE: 02/17/22    PT Start Time 0930    PT Stop Time 1010    PT Time Calculation (min) 40 min    Activity Tolerance Patient tolerated treatment well             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   Had inpatient treatment at [facility] Sees perinatal pysch   Past Surgical History:  Procedure Laterality Date   CESAREAN SECTION N/A 09/28/2019   Procedure: CESAREAN SECTION;  Surgeon: Vena Austria, MD;  Location: [facility] ORS;  Service: Obstetrics;  Laterality: N/A;  [id number] apgar-8,9 weight 9lbs 11oz   COLPOSCOPY     TONSILLECTOMY     TYMPANOSTOMY TUBE PLACEMENT     Patient Active Problem List   Diagnosis Date Noted   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   History of eating disorder 10/07/2021   History of macrosomia in infant in prior pregnancy, currently pregnant 10/07/2021   PMDD (premenstrual dysphoric disorder) 10/06/2020   Thyroid nodule 08/22/2019   Subclinical hypothyroidism 08/14/2019   GERD without esophagitis 04/30/2016   Disordered eating 01/22/2013   Murmur 02/08/2012   Tachycardia 02/08/2012    PCP: [facility]  REFERRING PROVIDER: Dominic, Courtney Heys, CNM   REFERRING DIAG:  Z39.1 (ICD-10-CM) - Care and examination of lactating mother  N39.498 (ICD-10-CM) - Other urinary incontinence   THERAPY DIAG:  Pelvic floor dysfunction  Other lack of coordination  Muscle weakness (generalized)   Rationale for Evaluation and Treatment: Rehabilitation  ONSET  DATE: After second pregnancy                                                                                         PRECAUTIONS: None  WEIGHT BEARING RESTRICTIONS: No  FALLS:  Has patient fallen in last 6 months? Yes. Number of falls 9 months pregnant - fell on hands and knees   OCCUPATION/SOCIAL ACTIVITIES: Stay at home, playing outdoors, shopping, weightlifting/HIIT, walking   PLOF: Independent   LIVING ENVIRONMENT: Lives with: lives with their family Lives in: House/apartment               CHIEF CONCERN: Pt gave birth Dec 23, 2021 and feels that she cannot control her bladder as much after this pregnancy. Pt noticed one night waking up and having soaked herself with urine. Pt also has urinary leakage with walking to the bathroom/laughing. Pt has had a frequency of UTIs since childhood (every other month to every 3 months now) and even some during pregnancy. Pt reports intermittent  L groin pain for many years but is worse with increased physical activity as well as LBP with increased activity.  NPRS scale: L adductor and occasionally radiates towards vagina, 8/10 (worst), 0/10 (current), intermittent   Aggravating factors: after increased physical activity     PATIENT GOALS: Pt would like to be stronger and strengthen the deep core postpartum, being able to control urinary, leakage, and to continue with education    UROLOGICAL HISTORY Fluid intake: water, 10 cups of coffee (stops at 7pm but depends on day), 3 cups per day   Pain with urination: No Fully empty bladder: Yes Stream: Strong Urgency: No Frequency: 4-5x Nocturia: 0x  Leakage: Urge to void, Walking to the bathroom, and Laughing Pads: No  Amount: 3x/week having to change undergarments Bladder control (0-10): 6/10   GASTROINTESTINAL HISTORY Pain with bowel movement: Yes Type of bowel movement:Type (Bristol Stool Scale) between Type 1 and 4  and Strain Yes Frequency: 1-2x/day  Fully empty rectum:  occasionally, bloated  Leakage: No   SEXUAL HISTORY/FUNCTION Pain with intercourse: discomfort, tenderness after intercourse  Ability to have vaginal penetration:  Yes; Deep thrusting: Yes Able to achieve orgasm?: Yes  OBSTETRICAL HISTORY Vaginal deliveries: G2P2  Tearing: Grade 2 - stitches with second birth C-section deliveries: first delivery  Currently pregnant: No  GYNECOLOGICAL HISTORY Hysterectomy: no  Pelvic Organ Prolapse: None Pain with exam: yes  Heaviness/pressure: no    SUBJECTIVE:  Pt ihas been trying to practice lifting techniques and mechanics. Pt has noticed more lower back pain today. Pt describes the lower back pain happens with increased activity.   PAIN:  Are you having pain? Yes NPRS scale: 4/10, sharp, L lower back    OBJECTIVE:   COGNITION: Overall cognitive status: Within functional limits for tasks assessed     POSTURE:   Iliac crest height: L iliac crest higher   RANGE OF MOTION:    (Norm range in degrees)  LEFT 02/23/22 RIGHT 02/23/22  Lumbar forward flexion (65):  WNL    Lumbar extension (30): WNL    Lumbar lateral flexion (25):  WNL WNL  Thoracic and Lumbar rotation (30 degrees):    WNL WNL  Hip Flexion (0-125):   WNL WNL  Hip IR (0-45):  WNL WNL  Hip ER (0-45):  WNL WNL  Hip Adduction:      Hip Abduction (0-40):  WNL WNL  Hip extension (0-15):     (*= pain, Blank rows = not tested)   STRENGTH: MMT    RLE 02/23/22 LLE 02/23/22  Hip Flexion 5 5  Hip Extension    Hip Abduction     Hip Adduction     Hip ER  5 5*  Hip IR  5 5  Knee Extension 5 5  Knee Flexion 5 5  Dorsiflexion     Plantarflexion (seated) 5 5  (*= pain, Blank rows = not tested)   SPECIAL TESTS:  FABER (SN 81): negative B FADIR (SN 94): negative B   PALPATION:  Abdominal:  Diastasis:  1.5 fingers above umbilicus, 1.5 fingers at umbilicus, and 1 below  Rib flare: none   -No tenderness upon palpation of upper and lower quadrants    EXTERNAL  PELVIC EXAM: Patient educated on the purpose of the pelvic exam and articulated understanding; patient consented to the exam verbally.  Breath coordination: present but inconsistent Voluntary Contraction: present, 3/5 MMT  Relaxation: delayed  Perineal movement with sustained IAP increase ("bear down"): no change Perineal movement with rapid IAP increase ("cough"): no change Pubic symphysis: discomfort upon pressure applied  (0= no contraction, 1= flicker, 2= weak squeeze, 3=  fair squeeze with lift, 4= good squeeze and lift against resistance, 5= strong squeeze against strong resistance)   TODAY'S TREATMENT   Manual Therapy: In sidelying, STM at B lower paraspinals on L > R Increased sharp pain/tenderness at L parapsinals (L4-L5)  Improved with increased time    Neuromuscular Re-education: Pre-treatment asessment:   PPVIMs in prone: T6 - T12 - WNL with minimal pain at T6 L1 - L3- WNL with no pain  L3- L5 with increased pain and unable to perform mobs  L SIJ - radiating pain towards low back   Supine pain modulation techniques with coordinated breath, VCs and TCs as needed - while on heat modality              B Single knee to chest               "Happy baby" pose   Pelvic tilts   Lateral trunk rotation  [place]ann abdominal rehab   Supine hooklying TrA contraction with coordinated exhale   Seated TrA contraction with LE challenge (marches) and coordinated breath for improved IAP management, VCs and TCs as needed    Patient response to interventions: Pt ended session with 1/10 pain in the lower back    Patient Education:  Patient provided with HEP including: seated TrA activation with marches, pelvic tilts/LTR. Patient educated throughout session on appropriate technique and form using multi-modal cueing, HEP, and activity modification. Patient will benefit from further education in order to maximize compliance and understanding for long-term therapeutic  gains.    ASSESSMENT:  Clinical Impression: Patient arrives to clinic with excellent motivation to participate in today's session. Pt continues to demonstrate deficits in IAP management, PFM strength, PFM coordination, PFM extensibility, pain, posture and scar mobility. Pt with 4/10 lower back pain today as well as some radiation into the L groin. Pt has noticed more low back pain with increased activity. Discussion on taking rest breaks before the pain becomes worse and demonstration of pain modulation techniques. Pt required moderate VCs and TCs  for challenging TrA activation in sitting for proper technique and to decrease bodily compensations. Pt ended session with 1/10 pain in the lumbar spine. Pt responded positively to manual, active, and educational interventions. Patient will benefit from skilled therapeutic intervention to address deficits in IAP management, PFM strength, PFM coordination, PFM extensibility, posture, pain, and scar mobility in order to increase PLOF and improve overall QOL.    Objective Impairments: decreased coordination, decreased endurance, decreased strength, increased fascial restrictions, improper body mechanics, postural dysfunction, and pain.   Activity Limitations: carrying, continence, toileting, locomotion level, and caring for others  Personal Factors: Behavior pattern, Past/current experiences, and 1-2 comorbidities: GERD and hx of eating disorder  are also affecting patient's functional outcome.   Rehab Potential: Good  Clinical Decision Making: Evolving/moderate complexity  Evaluation Complexity: Moderate   GOALS: Goals reviewed with patient? Yes  SHORT TERM GOALS: Target date: 03/24/2022  Patient will decrease intake of caffeinated beverages and other bladder irritants to less than or equal to 5 cups in order to manage urinary frequency and bladder irritation for improved overall QOL and participation at home and in the community. Baseline: up  to 10 cups of coffee per day Goal status: INITIAL    LONG TERM GOALS: Target date: 04/28/2022   Patient will demonstrate circumferential and sequential contraction of >3/5 MMT, > 5 sec hold x5 and 5 consecutive quick flicks with </= 10 min rest between testing bouts, and relaxation of the  PFM coordinated with breath for improved management of intra-abdominal pressure and normal bowel and bladder function without the presence of pain nor incontinence in order to improve participation at home and in the community. Baseline: 3/5 MMT  Goal status: INITIAL  2.  Patient will demonstrate coordinated lengthening and relaxation of PFM with diaphragmatic inhalation in order to decrease spasm and allow for unrestricted elimination of urine/feces for improved overall QOL. Baseline: has pain with BM, occasional strain (Type 1 and 4)/no change in PFM and Valsalva noted Goal status: INITIAL  3.  Patient will report less than 5 incidents of stress urinary incontinence over the course of 3 weeks while walking to the bathroom/urge to void/laughing in order to demonstrate improved PFM coordination, strength, and function for improved overall QOL. Baseline: urinary leakage with all the above Goal status: INITIAL  4.  Patient will report being able to return to activities including, but not limited to: penetrative sex, annual GYN exams, and physical activity without pain or limitation to indicate complete resolution of the chief complaint and return to prior level of participation at home and in the community. Baseline: discomfort with penetrative sex, pain with GYN exams and groin pain with increased physical activity  Goal status: INITIAL  5.  Patient will decrease worst pain as reported on NPRS by at least 2 points to demonstrate clinically significant reduction in pain in order to restore/improve function and overall QOL. Baseline: 8/10 L groin Goal status: INITIAL  6.  Patient will score  >/= 64 on FOTO  Urinary Problem  in order to demonstrate improved IAP management, improved PFM coordination, improved PFM strength and overall QOL.  Baseline: 58 Goal status: INITIAL  PLAN: PT Frequency: 1x/week  PT Duration: 10 weeks  Planned Interventions: Therapeutic exercises, Therapeutic activity, Neuromuscular re-education, Balance training, Gait training, Patient/Family education, Self Care, Joint mobilization, Spinal mobilization, Cryotherapy, Moist heat, scar mobilization, Taping, and Manual therapy  Plan For Next Session:  focus on R paraspinals, scar mob talk about (c-section), manual at scars, manual at back, childs pose/cat cow, how was deep core?    Deaysia Grigoryan, PT, DPT  03/23/2022, 9:34 AM

## 2022-04-01 ENCOUNTER — Ambulatory Visit: Payer: Medicaid Other

## 2022-04-01 DIAGNOSIS — R278 Other lack of coordination: Secondary | ICD-10-CM

## 2022-04-01 DIAGNOSIS — M6281 Muscle weakness (generalized): Secondary | ICD-10-CM

## 2022-04-01 DIAGNOSIS — M6289 Other specified disorders of muscle: Secondary | ICD-10-CM

## 2022-04-01 NOTE — Therapy (Signed)
OUTPATIENT PHYSICAL THERAPY FEMALE PELVIC TREATMENT    Patient Name: Sara Henson MRN: [id number] DOB:11-01-2000, 21 y.o., female Today's Date: 04/01/2022   PT End of Session - 04/01/22 0852     Visit Number 6    Number of Visits 10    Date for PT Re-Evaluation 04/28/22    Authorization Type IE: 02/17/22    PT Start Time 0851    PT Stop Time 0925    PT Time Calculation (min) 34 min    Activity Tolerance Patient tolerated treatment well             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   Had inpatient treatment at [facility] Sees perinatal pysch   Past Surgical History:  Procedure Laterality Date   CESAREAN SECTION N/A 09/28/2019   Procedure: CESAREAN SECTION;  Surgeon: Vena Austria, MD;  Location: [facility] ORS;  Service: Obstetrics;  Laterality: N/A;  [id number] apgar-8,9 weight 9lbs 11oz   COLPOSCOPY     TONSILLECTOMY     TYMPANOSTOMY TUBE PLACEMENT     Patient Active Problem List   Diagnosis Date Noted   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   History of eating disorder 10/07/2021   History of macrosomia in infant in prior pregnancy, currently pregnant 10/07/2021   PMDD (premenstrual dysphoric disorder) 10/06/2020   Thyroid nodule 08/22/2019   Subclinical hypothyroidism 08/14/2019   GERD without esophagitis 04/30/2016   Disordered eating 01/22/2013   Murmur 02/08/2012   Tachycardia 02/08/2012    PCP: [facility]  REFERRING PROVIDER: Dominic, Courtney Heys, CNM   REFERRING DIAG:  Z39.1 (ICD-10-CM) - Care and examination of lactating mother  N39.498 (ICD-10-CM) - Other urinary incontinence   THERAPY DIAG:  Pelvic floor dysfunction  Other lack of coordination  Muscle weakness (generalized)   Rationale for Evaluation and Treatment: Rehabilitation  ONSET  DATE: After second pregnancy                                                                                         PRECAUTIONS: None  WEIGHT BEARING RESTRICTIONS: No  FALLS:  Has patient fallen in last 6 months? Yes. Number of falls 9 months pregnant - fell on hands and knees   OCCUPATION/SOCIAL ACTIVITIES: Stay at home, playing outdoors, shopping, weightlifting/HIIT, walking   PLOF: Independent   LIVING ENVIRONMENT: Lives with: lives with their family Lives in: House/apartment               CHIEF CONCERN: Pt gave birth Dec 23, 2021 and feels that she cannot control her bladder as much after this pregnancy. Pt noticed one night waking up and having soaked herself with urine. Pt also has urinary leakage with walking to the bathroom/laughing. Pt has had a frequency of UTIs since childhood (every other month to every 3 months now) and even some during pregnancy. Pt reports intermittent  L groin pain for many years but is worse with increased physical activity as well as LBP with increased activity.  NPRS scale: L adductor and occasionally radiates towards vagina, 8/10 (worst), 0/10 (current), intermittent   Aggravating factors: after increased physical activity     PATIENT GOALS: Pt would like to be stronger and strengthen the deep core postpartum, being able to control urinary, leakage, and to continue with education    UROLOGICAL HISTORY Fluid intake: water, 10 cups of coffee (stops at 7pm but depends on day), 3 cups per day   Pain with urination: No Fully empty bladder: Yes Stream: Strong Urgency: No Frequency: 4-5x Nocturia: 0x  Leakage: Urge to void, Walking to the bathroom, and Laughing Pads: No  Amount: 3x/week having to change undergarments Bladder control (0-10): 6/10   GASTROINTESTINAL HISTORY Pain with bowel movement: Yes Type of bowel movement:Type (Bristol Stool Scale) between Type 1 and 4  and Strain Yes Frequency: 1-2x/day  Fully empty rectum:  occasionally, bloated  Leakage: No   SEXUAL HISTORY/FUNCTION Pain with intercourse: discomfort, tenderness after intercourse  Ability to have vaginal penetration:  Yes; Deep thrusting: Yes Able to achieve orgasm?: Yes  OBSTETRICAL HISTORY Vaginal deliveries: G2P2  Tearing: Grade 2 - stitches with second birth C-section deliveries: first delivery  Currently pregnant: No  GYNECOLOGICAL HISTORY Hysterectomy: no  Pelvic Organ Prolapse: None Pain with exam: yes  Heaviness/pressure: no    SUBJECTIVE:  Pt arrived 6 mins after scheduled time. Pt continues to have low back pain. Pt has continued to practice HEP. Pt describes the lower back pain happens with increased activity.   PAIN:  Are you having pain? Yes NPRS scale: 4/10, sharp, L lower back    OBJECTIVE:   COGNITION: Overall cognitive status: Within functional limits for tasks assessed     POSTURE:   Iliac crest height: L iliac crest higher   RANGE OF MOTION:    (Norm range in degrees)  LEFT 02/23/22 RIGHT 02/23/22  Lumbar forward flexion (65):  WNL    Lumbar extension (30): WNL    Lumbar lateral flexion (25):  WNL WNL  Thoracic and Lumbar rotation (30 degrees):    WNL WNL  Hip Flexion (0-125):   WNL WNL  Hip IR (0-45):  WNL WNL  Hip ER (0-45):  WNL WNL  Hip Adduction:      Hip Abduction (0-40):  WNL WNL  Hip extension (0-15):     (*= pain, Blank rows = not tested)   STRENGTH: MMT    RLE 02/23/22 LLE 02/23/22  Hip Flexion 5 5  Hip Extension    Hip Abduction     Hip Adduction     Hip ER  5 5*  Hip IR  5 5  Knee Extension 5 5  Knee Flexion 5 5  Dorsiflexion     Plantarflexion (seated) 5 5  (*= pain, Blank rows = not tested)   SPECIAL TESTS:  FABER (SN 81): negative B FADIR (SN 94): negative B   PALPATION:  Abdominal:  Diastasis:  1.5 fingers above umbilicus, 1.5 fingers at umbilicus, and 1 below  Rib flare: none   -No tenderness upon palpation of upper and lower quadrants    EXTERNAL  PELVIC EXAM: Patient educated on the purpose of the pelvic exam and articulated understanding; patient consented to the exam verbally.  Breath coordination: present but inconsistent Voluntary Contraction: present, 3/5 MMT  Relaxation: delayed  Perineal movement with sustained IAP increase ("bear down"): no change Perineal movement with rapid IAP increase ("cough"): no change Pubic symphysis: discomfort upon pressure applied  (0= no contraction, 1= flicker, 2= weak  squeeze, 3= fair squeeze with lift, 4= good squeeze and lift against resistance, 5= strong squeeze against strong resistance)   TODAY'S TREATMENT   Manual Therapy: In prone, STM at B lower paraspinals, more pain L>R Increased sharp pain/tenderness at L parapsinals (L4-S1) Muscular tension more R>L but improved B with increased time  Improved with increased time    Neuromuscular Re-education: Supine pain modulation techniques with coordinated breath, VCs and TCs as needed - while on heat modality              Childs pose             Cat/cow  STSs, x15, with coordinated breath and added 16 lbs (baby's weight) for improved IAP management, VCs and TCs required  Continuing to discuss breastfeeding positions and how to "wear" the baby for proper body mechanics and lessening low back pain.    Patient response to interventions: Pt ended session with 0/10 pain in the lower back    Patient Education:  Patient provided with HEP including: pain modulation techniques above, STSs. Patient educated throughout session on appropriate technique and form using multi-modal cueing, HEP, and activity modification. Patient will benefit from further education in order to maximize compliance and understanding for long-term therapeutic gains.    ASSESSMENT:  Clinical Impression: Patient arrives to clinic with excellent motivation to participate in today's session. Pt continues to demonstrate deficits in IAP management, PFM strength, PFM  coordination, PFM extensibility, pain, posture and scar mobility. Pt with 4/10 lower back pain today. Pt has noticed more low back pain with increased activity especially. Upon palpation, R lumbar paraspinals with more tension but increased pain on the L. With continued intervention, muscle tension released B and Pt ended session with 0/10 low back pain. Pt required moderate VCs and TCs for pain modulation techniques and tissue extensibility for proper technique and to continue with diaphragmatic breathing. Pt responded positively to manual, active, and educational interventions. Patient will benefit from skilled therapeutic intervention to address deficits in IAP management, PFM strength, PFM coordination, PFM extensibility, posture, pain, and scar mobility in order to increase PLOF and improve overall QOL.    Objective Impairments: decreased coordination, decreased endurance, decreased strength, increased fascial restrictions, improper body mechanics, postural dysfunction, and pain.   Activity Limitations: carrying, continence, toileting, locomotion level, and caring for others  Personal Factors: Behavior pattern, Past/current experiences, and 1-2 comorbidities: GERD and hx of eating disorder  are also affecting patient's functional outcome.   Rehab Potential: Good  Clinical Decision Making: Evolving/moderate complexity  Evaluation Complexity: Moderate   GOALS: Goals reviewed with patient? Yes  SHORT TERM GOALS: Target date: 03/24/2022  Patient will decrease intake of caffeinated beverages and other bladder irritants to less than or equal to 5 cups in order to manage urinary frequency and bladder irritation for improved overall QOL and participation at home and in the community. Baseline: up to 10 cups of coffee per day Goal status: INITIAL    LONG TERM GOALS: Target date: 04/28/2022   Patient will demonstrate circumferential and sequential contraction of >3/5 MMT, > 5 sec hold x5 and  5 consecutive quick flicks with </= 10 min rest between testing bouts, and relaxation of the PFM coordinated with breath for improved management of intra-abdominal pressure and normal bowel and bladder function without the presence of pain nor incontinence in order to improve participation at home and in the community. Baseline: 3/5 MMT  Goal status: INITIAL  2.  Patient will demonstrate coordinated lengthening  and relaxation of PFM with diaphragmatic inhalation in order to decrease spasm and allow for unrestricted elimination of urine/feces for improved overall QOL. Baseline: has pain with BM, occasional strain (Type 1 and 4)/no change in PFM and Valsalva noted Goal status: INITIAL  3.  Patient will report less than 5 incidents of stress urinary incontinence over the course of 3 weeks while walking to the bathroom/urge to void/laughing in order to demonstrate improved PFM coordination, strength, and function for improved overall QOL. Baseline: urinary leakage with all the above Goal status: INITIAL  4.  Patient will report being able to return to activities including, but not limited to: penetrative sex, annual GYN exams, and physical activity without pain or limitation to indicate complete resolution of the chief complaint and return to prior level of participation at home and in the community. Baseline: discomfort with penetrative sex, pain with GYN exams and groin pain with increased physical activity  Goal status: INITIAL  5.  Patient will decrease worst pain as reported on NPRS by at least 2 points to demonstrate clinically significant reduction in pain in order to restore/improve function and overall QOL. Baseline: 8/10 L groin Goal status: INITIAL  6.  Patient will score  >/= 64 on FOTO Urinary Problem  in order to demonstrate improved IAP management, improved PFM coordination, improved PFM strength and overall QOL.  Baseline: 58 Goal status: INITIAL  PLAN: PT Frequency:  1x/week  PT Duration: 10 weeks  Planned Interventions: Therapeutic exercises, Therapeutic activity, Neuromuscular re-education, Balance training, Gait training, Patient/Family education, Self Care, Joint mobilization, Spinal mobilization, Cryotherapy, Moist heat, scar mobilization, Taping, and Manual therapy  Plan For Next Session:  scar mob talk about (c-section), manual at scars, manual at back, continue progressing deep core   Memory Heinrichs, PT, DPT  04/01/2022, 9:30 AM

## 2022-04-07 ENCOUNTER — Ambulatory Visit: Payer: Medicaid Other | Attending: Licensed Practical Nurse

## 2022-04-07 DIAGNOSIS — M6289 Other specified disorders of muscle: Secondary | ICD-10-CM | POA: Insufficient documentation

## 2022-04-07 DIAGNOSIS — M6281 Muscle weakness (generalized): Secondary | ICD-10-CM | POA: Insufficient documentation

## 2022-04-07 DIAGNOSIS — R278 Other lack of coordination: Secondary | ICD-10-CM | POA: Diagnosis present

## 2022-04-07 NOTE — Therapy (Signed)
OUTPATIENT PHYSICAL THERAPY FEMALE PELVIC TREATMENT    Patient Name: Sara Henson MRN: [id number] DOB:2000-05-14, 21 y.o., female Today's Date: 04/07/2022   PT End of Session - 04/07/22 0837     Visit Number 7    Number of Visits 10    Date for PT Re-Evaluation 04/28/22    Authorization Type IE: 02/17/22    PT Start Time 0840    PT Stop Time 0920    PT Time Calculation (min) 40 min    Activity Tolerance Patient tolerated treatment well             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   Had inpatient treatment at [facility] Sees perinatal pysch   Past Surgical History:  Procedure Laterality Date   CESAREAN SECTION N/A 09/28/2019   Procedure: CESAREAN SECTION;  Surgeon: Vena Austria, MD;  Location: [facility] ORS;  Service: Obstetrics;  Laterality: N/A;  EVO:[id number] apgar-8,9 weight 9lbs 11oz   COLPOSCOPY     TONSILLECTOMY     TYMPANOSTOMY TUBE PLACEMENT     Patient Active Problem List   Diagnosis Date Noted   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   History of eating disorder 10/07/2021   History of macrosomia in infant in prior pregnancy, currently pregnant 10/07/2021   PMDD (premenstrual dysphoric disorder) 10/06/2020   Thyroid nodule 08/22/2019   Subclinical hypothyroidism 08/14/2019   GERD without esophagitis 04/30/2016   Disordered eating 01/22/2013   Murmur 02/08/2012   Tachycardia 02/08/2012    PCP: [facility]  REFERRING PROVIDER: Dominic, Courtney Heys, CNM   REFERRING DIAG:  Z39.1 (ICD-10-CM) - Care and examination of lactating mother  N39.498 (ICD-10-CM) - Other urinary incontinence   THERAPY DIAG:  Pelvic floor dysfunction  Other lack of coordination  Muscle weakness (generalized)   Rationale for Evaluation and Treatment: Rehabilitation  ONSET DATE:  After second pregnancy                                                                                         PRECAUTIONS: None  WEIGHT BEARING RESTRICTIONS: No  FALLS:  Has patient fallen in last 6 months? Yes. Number of falls 9 months pregnant - fell on hands and knees   OCCUPATION/SOCIAL ACTIVITIES: Stay at home, playing outdoors, shopping, weightlifting/HIIT, walking   PLOF: Independent   LIVING ENVIRONMENT: Lives with: lives with their family Lives in: House/apartment               CHIEF CONCERN: Pt gave birth Dec 23, 2021 and feels that she cannot control her bladder as much after this pregnancy. Pt noticed one night waking up and having soaked herself with urine. Pt also has urinary leakage with walking to the bathroom/laughing. Pt has had a frequency of UTIs since childhood (every other month to every 3 months now) and even some during pregnancy. Pt reports intermittent  L groin pain for many years but is worse with increased physical activity as well as LBP with increased activity.  NPRS scale: L adductor and occasionally radiates towards vagina, 8/10 (worst), 0/10 (current), intermittent   Aggravating factors: after increased physical activity     PATIENT GOALS: Pt would like to be stronger and strengthen the deep core postpartum, being able to control urinary, leakage, and to continue with education    UROLOGICAL HISTORY Fluid intake: water, 10 cups of coffee (stops at 7pm but depends on day), 3 cups per day   Pain with urination: No Fully empty bladder: Yes Stream: Strong Urgency: No Frequency: 4-5x Nocturia: 0x  Leakage: Urge to void, Walking to the bathroom, and Laughing Pads: No  Amount: 3x/week having to change undergarments Bladder control (0-10): 6/10   GASTROINTESTINAL HISTORY Pain with bowel movement: Yes Type of bowel movement:Type (Bristol Stool Scale) between Type 1 and 4  and Strain Yes Frequency: 1-2x/day  Fully empty rectum:  occasionally, bloated  Leakage: No   SEXUAL HISTORY/FUNCTION Pain with intercourse: discomfort, tenderness after intercourse  Ability to have vaginal penetration:  Yes; Deep thrusting: Yes Able to achieve orgasm?: Yes  OBSTETRICAL HISTORY Vaginal deliveries: G2P2  Tearing: Grade 2 - stitches with second birth C-section deliveries: first delivery  Currently pregnant: No  GYNECOLOGICAL HISTORY Hysterectomy: no  Pelvic Organ Prolapse: None Pain with exam: yes  Heaviness/pressure: no    SUBJECTIVE:  Pt reports doing well other than continuing to have some intermittent low back pain. Pt notices it more with fast forward flexion not thinking about body mechanics.   PAIN:  Are you having pain? Yes NPRS scale: 2/10, dull, lower back     OBJECTIVE:   COGNITION: Overall cognitive status: Within functional limits for tasks assessed     POSTURE:   Iliac crest height: L iliac crest higher   RANGE OF MOTION:    (Norm range in degrees)  LEFT 02/23/22 RIGHT 02/23/22  Lumbar forward flexion (65):  WNL    Lumbar extension (30): WNL    Lumbar lateral flexion (25):  WNL WNL  Thoracic and Lumbar rotation (30 degrees):    WNL WNL  Hip Flexion (0-125):   WNL WNL  Hip IR (0-45):  WNL WNL  Hip ER (0-45):  WNL WNL  Hip Adduction:      Hip Abduction (0-40):  WNL WNL  Hip extension (0-15):     (*= pain, Blank rows = not tested)   STRENGTH: MMT    RLE 02/23/22 LLE 02/23/22  Hip Flexion 5 5  Hip Extension    Hip Abduction     Hip Adduction     Hip ER  5 5*  Hip IR  5 5  Knee Extension 5 5  Knee Flexion 5 5  Dorsiflexion     Plantarflexion (seated) 5 5  (*= pain, Blank rows = not tested)   SPECIAL TESTS:  FABER (SN 81): negative B FADIR (SN 94): negative B   PALPATION:  Abdominal:  Diastasis:  1.5 fingers above umbilicus, 1.5 fingers at umbilicus, and 1 below  Rib flare: none   -No tenderness upon palpation of upper and lower quadrants    EXTERNAL PELVIC EXAM:  Patient educated on the purpose of the pelvic exam and articulated understanding; patient consented to the exam verbally.  Breath coordination: present but inconsistent Voluntary Contraction: present, 3/5 MMT  Relaxation: delayed  Perineal movement with sustained IAP increase ("bear down"): no change Perineal movement with rapid IAP increase ("cough"): no change Pubic symphysis: discomfort upon pressure applied  (0= no contraction, 1= flicker, 2= weak squeeze, 3= fair  squeeze with lift, 4= good squeeze and lift against resistance, 5= strong squeeze against strong resistance)   TODAY'S TREATMENT   Manual Therapy: In supine, Abdominal myofascial release for improved fascial sling mobility and pain modulation  Scar mobilization in circular/vertical/horizontal motions for improved tissue extensibility  Increased scar restriction towards L and numbness, no pain   Neuromuscular Re-education: Supine hooklying diaphragmatic breathing with VCs and TCs for downregulation of the nervous system and improved management of IAP  Supine isometric dead bug with LE, 4 x 30 secs, VCs and TCs required Added UE for increased challenge  Pallof press, GTB, B x10, with coordinated breath for improved IAP management, VCs and TCs required  Continuing to discuss breastfeeding positions and how to "wear" the baby for proper body mechanics and lessening low back pain.    Patient response to interventions: Pt comfortable to discharge in 3 weeks   Patient Education:  Patient provided with HEP including: isometric dead bug, pallof press. Patient educated throughout session on appropriate technique and form using multi-modal cueing, HEP, and activity modification. Patient will benefit from further education in order to maximize compliance and understanding for long-term therapeutic gains.    ASSESSMENT:  Clinical Impression: Patient arrives to clinic with excellent motivation to participate in today's  session. Pt continues to demonstrate deficits in IAP management, PFM strength, PFM coordination, PFM extensibility, pain, posture and scar mobility. Pt with 2/10 lower back pain today which is described as dull. Discussion on continuing practicing proper body mechanics and lifting techniques especially while caring for young children. Upon palpation of c-section scar, Pt presents with some scar restriction more L >R and numbness. With continued manual intervention, scar restriction decreased. Discussion on performing scar mobilizations as part of HEP.  Pt required moderate VCs and TCs for TrA activation challenges in various positions to decrease bodily compensations. Pt responded positively to manual, active, and educational interventions. Patient will benefit from skilled therapeutic intervention to address deficits in IAP management, PFM strength, PFM coordination, PFM extensibility, posture, pain, and scar mobility in order to increase PLOF and improve overall QOL.    Objective Impairments: decreased coordination, decreased endurance, decreased strength, increased fascial restrictions, improper body mechanics, postural dysfunction, and pain.   Activity Limitations: carrying, continence, toileting, locomotion level, and caring for others  Personal Factors: Behavior pattern, Past/current experiences, and 1-2 comorbidities: GERD and hx of eating disorder  are also affecting patient's functional outcome.   Rehab Potential: Good  Clinical Decision Making: Evolving/moderate complexity  Evaluation Complexity: Moderate   GOALS: Goals reviewed with patient? Yes  SHORT TERM GOALS: Target date: 03/24/2022  Patient will decrease intake of caffeinated beverages and other bladder irritants to less than or equal to 5 cups in order to manage urinary frequency and bladder irritation for improved overall QOL and participation at home and in the community. Baseline: up to 10 cups of coffee per day Goal  status: INITIAL    LONG TERM GOALS: Target date: 04/28/2022   Patient will demonstrate circumferential and sequential contraction of >3/5 MMT, > 5 sec hold x5 and 5 consecutive quick flicks with </= 10 min rest between testing bouts, and relaxation of the PFM coordinated with breath for improved management of intra-abdominal pressure and normal bowel and bladder function without the presence of pain nor incontinence in order to improve participation at home and in the community. Baseline: 3/5 MMT  Goal status: INITIAL  2.  Patient will demonstrate coordinated lengthening and relaxation of PFM with diaphragmatic inhalation in  order to decrease spasm and allow for unrestricted elimination of urine/feces for improved overall QOL. Baseline: has pain with BM, occasional strain (Type 1 and 4)/no change in PFM and Valsalva noted Goal status: INITIAL  3.  Patient will report less than 5 incidents of stress urinary incontinence over the course of 3 weeks while walking to the bathroom/urge to void/laughing in order to demonstrate improved PFM coordination, strength, and function for improved overall QOL. Baseline: urinary leakage with all the above Goal status: INITIAL  4.  Patient will report being able to return to activities including, but not limited to: penetrative sex, annual GYN exams, and physical activity without pain or limitation to indicate complete resolution of the chief complaint and return to prior level of participation at home and in the community. Baseline: discomfort with penetrative sex, pain with GYN exams and groin pain with increased physical activity  Goal status: INITIAL  5.  Patient will decrease worst pain as reported on NPRS by at least 2 points to demonstrate clinically significant reduction in pain in order to restore/improve function and overall QOL. Baseline: 8/10 L groin Goal status: INITIAL  6.  Patient will score  >/= 64 on FOTO Urinary Problem  in order to  demonstrate improved IAP management, improved PFM coordination, improved PFM strength and overall QOL.  Baseline: 58 Goal status: INITIAL  PLAN: PT Frequency: 1x/week  PT Duration: 10 weeks  Planned Interventions: Therapeutic exercises, Therapeutic activity, Neuromuscular re-education, Balance training, Gait training, Patient/Family education, Self Care, Joint mobilization, Spinal mobilization, Cryotherapy, Moist heat, scar mobilization, Taping, and Manual therapy  Plan For Next Session: give HEP on scars, continue progressing deep core, manual at back   [facility], PT, DPT  04/07/2022, 9:00 AM

## 2022-04-14 ENCOUNTER — Ambulatory Visit: Payer: Medicaid Other

## 2022-04-21 ENCOUNTER — Ambulatory Visit: Payer: Medicaid Other

## 2022-04-21 DIAGNOSIS — M6289 Other specified disorders of muscle: Secondary | ICD-10-CM | POA: Diagnosis not present

## 2022-04-21 DIAGNOSIS — M6281 Muscle weakness (generalized): Secondary | ICD-10-CM

## 2022-04-21 DIAGNOSIS — R278 Other lack of coordination: Secondary | ICD-10-CM

## 2022-04-21 NOTE — Therapy (Signed)
OUTPATIENT PHYSICAL THERAPY FEMALE PELVIC DISCHARGE   Patient Name: Sara Henson MRN: [id number] DOB:2001-02-01, 21 y.o., female Today's Date: 04/21/2022   PT End of Session - 04/21/22 0756     Visit Number 8    Number of Visits 10    Date for PT Re-Evaluation 04/28/22    Authorization Type IE: 02/17/22    PT Start Time 0800    PT Stop Time 0840    PT Time Calculation (min) 40 min    Activity Tolerance Patient tolerated treatment well             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   Had inpatient treatment at [facility]   Past Surgical History:  Procedure Laterality Date   CESAREAN SECTION N/A 09/28/2019   Procedure: CESAREAN SECTION;  Surgeon: Malachy Mood, MD;  Location: [facility] ORS;  Service: Obstetrics;  Laterality: N/A;  [id number] apgar-8,9 weight 9lbs 11oz   COLPOSCOPY     TONSILLECTOMY     TYMPANOSTOMY TUBE PLACEMENT     Patient Active Problem List   Diagnosis Date Noted   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   History of eating disorder 10/07/2021   History of macrosomia in infant in prior pregnancy, currently pregnant 10/07/2021   PMDD (premenstrual dysphoric disorder) 10/06/2020   Thyroid nodule 08/22/2019   Subclinical hypothyroidism 08/14/2019   GERD without esophagitis 04/30/2016   Disordered eating 01/22/2013   Murmur 02/08/2012   Tachycardia 02/08/2012    PCP: [facility]  REFERRING PROVIDER: Dominic, Nunzio Cobbs, CNM   REFERRING DIAG:  Z39.1 (ICD-10-CM) - Care and examination of lactating mother  N39.498 (ICD-10-CM) - Other urinary incontinence   THERAPY DIAG:  Pelvic floor dysfunction  Other lack of coordination  Muscle weakness (generalized)   Rationale for Evaluation and Treatment: Rehabilitation  ONSET DATE:  After second pregnancy                                                                                         PRECAUTIONS: None  WEIGHT BEARING RESTRICTIONS: No  FALLS:  Has patient fallen in last 6 months? Yes. Number of falls 9 months pregnant - fell on hands and knees   OCCUPATION/SOCIAL ACTIVITIES: Stay at home, playing outdoors, shopping, weightlifting/HIIT, walking   PLOF: Independent   LIVING ENVIRONMENT: Lives with: lives with their family Lives in: House/apartment               CHIEF CONCERN: Pt gave birth Dec 23, 2021 and feels that she cannot control her bladder as much after this pregnancy. Pt noticed one night waking up and having soaked herself with urine. Pt also has urinary leakage with walking to the bathroom/laughing. Pt has had a frequency of UTIs since childhood (every other month to every 3 months now) and even some during pregnancy. Pt reports intermittent  L groin pain for many years but is worse with increased physical activity as well as LBP with increased activity.  NPRS scale: L adductor and occasionally radiates towards vagina, 8/10 (worst), 0/10 (current), intermittent   Aggravating factors: after increased physical activity     PATIENT GOALS: Pt would like to be stronger and strengthen the deep core postpartum, being able to control urinary, leakage, and to continue with education    UROLOGICAL HISTORY Fluid intake: water, 10 cups of coffee (stops at 7pm but depends on day), 3 cups per day   Pain with urination: No Fully empty bladder: Yes Stream: Strong Urgency: No Frequency: 4-5x Nocturia: 0x  Leakage: Urge to void, Walking to the bathroom, and Laughing Pads: No  Amount: 3x/week having to change undergarments Bladder control (0-10): 6/10   GASTROINTESTINAL HISTORY Pain with bowel movement: Yes Type of bowel movement:Type (Bristol Stool Scale) between Type 1 and 4  and Strain Yes Frequency: 1-2x/day  Fully empty rectum:  occasionally, bloated  Leakage: No   SEXUAL HISTORY/FUNCTION Pain with intercourse: discomfort, tenderness after intercourse  Ability to have vaginal penetration:  Yes; Deep thrusting: Yes Able to achieve orgasm?: Yes  OBSTETRICAL HISTORY Vaginal deliveries: G2P2  Tearing: Grade 2 - stitches with second birth C-section deliveries: first delivery  Currently pregnant: No  GYNECOLOGICAL HISTORY Hysterectomy: no  Pelvic Organ Prolapse: None Pain with exam: yes  Heaviness/pressure: no    SUBJECTIVE:  Pt is doing well. Pt has been performing light workouts and has felt better with the low back and hips.    PAIN:  Are you having pain? No    OBJECTIVE:   COGNITION: Overall cognitive status: Within functional limits for tasks assessed     POSTURE:   Iliac crest height: L iliac crest higher   RANGE OF MOTION:    (Norm range in degrees)  LEFT 02/23/22 RIGHT 02/23/22  Lumbar forward flexion (65):  WNL    Lumbar extension (30): WNL    Lumbar lateral flexion (25):  WNL WNL  Thoracic and Lumbar rotation (30 degrees):    WNL WNL  Hip Flexion (0-125):   WNL WNL  Hip IR (0-45):  WNL WNL  Hip ER (0-45):  WNL WNL  Hip Adduction:      Hip Abduction (0-40):  WNL WNL  Hip extension (0-15):     (*= pain, Blank rows = not tested)   STRENGTH: MMT    RLE 02/23/22 LLE 02/23/22  Hip Flexion 5 5  Hip Extension    Hip Abduction     Hip Adduction     Hip ER  5 5*  Hip IR  5 5  Knee Extension 5 5  Knee Flexion 5 5  Dorsiflexion     Plantarflexion (seated) 5 5  (*= pain, Blank rows = not tested)   SPECIAL TESTS:  FABER (SN 81): negative B FADIR (SN 94): negative B   PALPATION:  Abdominal:  Diastasis:  1.5 fingers above umbilicus, 1.5 fingers at umbilicus, and 1 below  Rib flare: none   -No tenderness upon palpation of upper and lower quadrants    EXTERNAL PELVIC EXAM: Patient educated on the purpose of the pelvic exam and articulated understanding; patient  consented to the exam verbally.  Breath coordination: present but inconsistent Voluntary Contraction: present, 3/5 MMT  Relaxation: delayed  Perineal movement with sustained IAP increase ("bear down"): no change Perineal movement with rapid IAP increase ("cough"): no change Pubic symphysis: discomfort upon pressure applied  (0= no contraction, 1= flicker, 2= weak squeeze, 3= fair squeeze with lift, 4= good squeeze and lift against resistance, 5= strong squeeze  against strong resistance)   TODAY'S TREATMENT   Neuromuscular Re-education: Review of goals below: Met 4/7 goals  Pt content with progression in PFPT and feels has the tools necessary to discharge  Reassessment of FOTO  FOTO Urinary Problem: IE July 05, 2056, Today 07/05/85   Demonstration of dead bug/bird dog and with increased challenges as well to practice in the future. Pt verbalized understanding.   Discussion on continuing scar mobilizations and prioritizing PFM lengthening techniques to aid in constipation. Pt verbalized understanding.    Patient response to interventions: Pt is comfortable to discharge    Patient Education:  Patient provided with HEP including: dead bug, bird dog, and wall plank with progressions for each. Patient educated throughout session on appropriate technique and form using multi-modal cueing, HEP, and activity modification. Patient will benefit from further education in order to maximize compliance and understanding for long-term therapeutic gains.    ASSESSMENT:  Clinical Impression: Patient arrives to clinic with excellent motivation to participate in today's discharge session. Pt has demonstrated significant progression from IE on 02/17/22 in relation to IAP management, PFM coordination, pain and posture. Based on FOTO Urinary Problem, Pt has demonstrated a 29 point change, 87, from IE 07/05/2056) which surpasses FOTO goal. Pt reports no urinary leakage, minimal discomfort with penetrative sex, and minimal L  groin pain (worst pain 3/10 compared to 8/10) with increased physical activity. Pt continues to have Type 1/2 BMs but using diaphragmatic breathing techniques and toileting techniques to aid. Pt has no pain with BMs compared to IE.  Discussion on continuing both c-section and perineal scar mobilizations as part of HEP. Also discussed increased challenges to activate TrA (DPT demonstrated). Pt verbalized understanding. Pt agrees that they have the necessary tools/techniques to discharge from PFPT. Pt given rehab services phone number for any future MSK need. Pt met 4/7 goals created below and is adequate for discharge.    Objective Impairments: decreased coordination, decreased endurance, decreased strength, increased fascial restrictions, improper body mechanics, postural dysfunction, and pain.   Activity Limitations: carrying, continence, toileting, locomotion level, and caring for others  Personal Factors: Behavior pattern, Past/current experiences, and 1-2 comorbidities: GERD and hx of eating disorder  are also affecting patient's functional outcome.   Rehab Potential: Good  Clinical Decision Making: Evolving/moderate complexity  Evaluation Complexity: Moderate   GOALS: Goals reviewed with patient? Yes  SHORT TERM GOALS: Target date: 03/24/2022  Patient will decrease intake of caffeinated beverages and other bladder irritants to less than or equal to 5 cups in order to manage urinary frequency and bladder irritation for improved overall QOL and participation at home and in the community. Baseline: up to 10 cups of coffee per day; (12/20): 5 cups a day now  Goal status: MET    LONG TERM GOALS: Target date: 04/28/2022   Patient will demonstrate circumferential and sequential contraction of >3/5 MMT, > 5 sec hold x5 and 5 consecutive quick flicks with </= 10 min rest between testing bouts, and relaxation of the PFM coordinated with breath for improved management of intra-abdominal  pressure and normal bowel and bladder function without the presence of pain nor incontinence in order to improve participation at home and in the community. Baseline: 3/5 MMT; did not test strength or endurance  Goal status: NOT MET  2.  Patient will demonstrate coordinated lengthening and relaxation of PFM with diaphragmatic inhalation in order to decrease spasm and allow for unrestricted elimination of urine/feces for improved overall QOL. Baseline: has pain with BM,  occasional strain (Type 1 and 4)/no change in PFM and Valsalva noted; (12/20): Pt isn't having pain with BM but continues to have Type 1/2 BMs and not straining  Goal status: NOT MET  3.  Patient will report less than 5 incidents of stress urinary incontinence over the course of 3 weeks while walking to the bathroom/urge to void/laughing in order to demonstrate improved PFM coordination, strength, and function for improved overall QOL. Baseline: urinary leakage with all the above; (12/20): 100% better, no urinary leakage Goal status: MET  4.  Patient will report being able to return to activities including, but not limited to: penetrative sex, annual GYN exams, and physical activity without pain or limitation to indicate complete resolution of the chief complaint and return to prior level of participation at home and in the community. Baseline: discomfort with penetrative sex, pain with GYN exams and groin pain with increased physical activity; (12/20): groin pain improved with increased physical activity, no discomfort with penetrative sex  Goal status: NOT MET  5.  Patient will decrease worst pain as reported on NPRS by at least 2 points to demonstrate clinically significant reduction in pain in order to restore/improve function and overall QOL. Baseline: 8/10 L groin; (12/20): 3/10 at its worst  Goal status: MET  6.  Patient will score  >/= 64 on FOTO Urinary Problem  in order to demonstrate improved IAP management, improved PFM  coordination, improved PFM strength and overall QOL.  Baseline: 58, (12/20): 87  Goal status: MET  PLAN: PT Frequency: 1x/week  PT Duration: 10 weeks  Planned Interventions: Therapeutic exercises, Therapeutic activity, Neuromuscular re-education, Balance training, Gait training, Patient/Family education, Self Care, Joint mobilization, Spinal mobilization, Cryotherapy, Moist heat, scar mobilization, Taping, and Manual therapy     Ernesto Lashway, PT, DPT  04/21/2022, 9:01 AM

## 2022-04-28 ENCOUNTER — Other Ambulatory Visit
Admission: RE | Admit: 2022-04-28 | Discharge: 2022-04-28 | Disposition: A | Discharge status: Home / Self Care | Payer: Medicaid Other | Attending: Nurse Practitioner | Admitting: Nurse Practitioner

## 2022-04-28 ENCOUNTER — Ambulatory Visit: Payer: Medicaid Other

## 2022-04-28 DIAGNOSIS — R7989 Other specified abnormal findings of blood chemistry: Secondary | ICD-10-CM | POA: Diagnosis not present

## 2022-04-28 DIAGNOSIS — E041 Nontoxic single thyroid nodule: Secondary | ICD-10-CM | POA: Insufficient documentation

## 2022-04-28 LAB — T4, FREE: Free T4: 0.89 ng/dL (ref 0.61–1.12)

## 2022-04-28 LAB — TSH: TSH: <0.01 u[IU]/mL — ABNORMAL LOW (ref 0.350–4.500)

## 2022-04-29 LAB — T3, FREE: T3, Free: 5.9 pg/mL — ABNORMAL HIGH (ref 2.0–4.4)

## 2022-05-04 ENCOUNTER — Encounter: Payer: Self-pay | Admitting: Nurse Practitioner

## 2022-05-04 ENCOUNTER — Ambulatory Visit (INDEPENDENT_AMBULATORY_CARE_PROVIDER_SITE_OTHER): Payer: Medicaid Other | Admitting: Nurse Practitioner

## 2022-05-04 VITALS — Ht 67.0 in | Wt 195.0 lb

## 2022-05-04 DIAGNOSIS — E0590 Thyrotoxicosis, unspecified without thyrotoxic crisis or storm: Secondary | ICD-10-CM | POA: Diagnosis not present

## 2022-05-04 DIAGNOSIS — E041 Nontoxic single thyroid nodule: Secondary | ICD-10-CM

## 2022-05-04 NOTE — Progress Notes (Signed)
05/04/2022     Endocrinology Follow Up Note    TELEHEALTH VISIT: The patient is being engaged in telehealth visit.  This type of visit limits physical examination significantly, and thus is not preferable over face-to-face encounters.  I connected with  Sara Henson on 05/04/22 by a video enabled telemedicine application and verified that I am speaking with the correct person using two identifiers.   I discussed the limitations of evaluation and management by telemedicine. The patient expressed understanding and agreed to proceed.    The participants involved in this visit include: Sara Gobble, NP located at [facility] and Sara Henson  located at their personal residence listed.    Subjective:    Patient ID: Sara Henson, female    DOB: 02/16/2001, PCP Patient, No Pcp Per.   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   Had inpatient treatment at [facility] perinatal pysch    Past Surgical History:  Procedure Laterality Date   CESAREAN SECTION N/A 09/28/2019   Procedure: CESAREAN SECTION;  Surgeon: Sara Austria, MD;  Location: [facility] ORS;  Service: Obstetrics;  Laterality: N/A;  [id number] apgar-8,9 weight 9lbs 11oz   COLPOSCOPY     TONSILLECTOMY     TYMPANOSTOMY TUBE PLACEMENT      Social History   Socioeconomic History   Marital status: Married    Spouse name: Sara Henson   Number of children: 0   Years of education: 13   Highest education level: High school graduate  Occupational History   Occupation: Unemployed  Tobacco Use   Smoking status: Never   Smokeless tobacco: Former    Types: Associate Professor Use: Former   Devices: quit 2020  Substance and Sexual Activity   Alcohol use: Not Currently   Drug use: No   Sexual activity: Yes   Other Topics Concern   Not on file  Social History Narrative   Not on file   Social Determinants of Health   Financial Resource Strain: Not on file  Food Insecurity: Not on file  Transportation Needs: Not on file  Physical Activity: Not on file  Stress: Not on file  Social Connections: Not on file    Family History  Problem Relation Age of Onset   Skin cancer Father     Outpatient Encounter Medications as of 05/04/2022  Medication Sig   FLUoxetine (PROZAC) 20 MG capsule Take 80 mg by mouth every morning.   dicloxacillin (DYNAPEN) 500 MG capsule Take 1 capsule (500 mg total) by mouth 4 (four) times daily. (Patient not taking: Reported on 05/04/2022)   No facility-administered encounter medications on file as of 05/04/2022.    ALLERGIES: No Known Allergies  VACCINATION STATUS: Immunization History  Administered Date(s) Administered   Influenza,inj,Quad PF,6+ Mos 03/07/2019   Tdap 07/16/2019, 10/07/2021     HPI  Sara Henson is 22 y.o. female who presents today with a medical history as above. she is being seen in follow up after being seen in consultation for hyperthyroidism requested by Patient, No Pcp Per.  she has been dealing with symptoms of anxiety, insomnia, intermittent palpitations on and off for 2 years (starting in 2020/2021). These symptoms are progressively worsening and troubling to her.    she does complain of feeling lump in left  neck, which is worse when laying down on her side at night making it uncomfortable to swallow at times.  She denies choking, shortness of breath, no recent voice change. She notes she had similar problems during her first pregnancy.   she denies family history of thyroid dysfunction and denies family hx of thyroid cancer. she denies personal history of goiter. she is not on any anti-thyroid medications nor on any thyroid hormone supplements. Denies use of Biotin containing supplements, she notes she was on a prenatal vitamin but she has  since stopped it since her son was born 3 weeks ago.  she is willing to proceed with appropriate work up and therapy for thyrotoxicosis.   Review of systems  Constitutional: + Minimally fluctuating body weight, current Body mass index is 30.54 kg/m., + fatigue, no subjective hyperthermia, no subjective hypothermia Eyes: no blurry vision, no xerophthalmia ENT: no sore throat, + nodules palpated in throat, + increasing dysphagia/odynophagia (still intermittent but worsening slightly), no hoarseness Cardiovascular: no chest pain, no shortness of breath, + intermittent palpitations, no leg swelling Respiratory: no cough, no shortness of breath Gastrointestinal: no nausea/vomiting/diarrhea Musculoskeletal: no muscle/joint aches Skin: no rashes, no hyperemia Neurological: no tremors, no numbness, no tingling, no dizziness Psychiatric: no depression, + anxiety, + insomnia   Objective:    Ht 5\' 7"  (1.702 m)   Wt 195 lb (88.5 kg)   BMI 30.54 kg/m   Wt Readings from Last 3 Encounters:  05/04/22 195 lb (88.5 kg)  02/12/22 190 lb (86.2 kg)  01/18/22 192 lb 9.6 oz (87.4 kg)     BP Readings from Last 3 Encounters:  02/12/22 119/82  01/18/22 116/74  01/08/22 122/70                        Physical Exam- Telehealth- significantly limited due to nature of visit  Constitutional: Body mass index is 30.54 kg/m. , not in acute distress, normal state of mind Respiratory: Adequate breathing efforts   CMP     Component Value Date/Time   NA 137 12/22/2021 0847   NA 139 12/15/2021 1107   K 3.5 12/22/2021 0847   CL 109 12/22/2021 0847   CO2 23 12/22/2021 0847   GLUCOSE 111 (H) 12/22/2021 0847   BUN 8 12/22/2021 0847   BUN 5 (L) 12/15/2021 1107   CREATININE 0.53 12/22/2021 0847   CALCIUM 9.0 12/22/2021 0847   PROT 6.0 (L) 12/22/2021 0847   PROT 5.7 (L) 12/15/2021 1107   ALBUMIN 3.1 (L) 12/22/2021 0847   ALBUMIN 3.7 (L) 12/15/2021 1107   AST 20 12/22/2021 0847   ALT 16 12/22/2021 0847    ALKPHOS 143 (H) 12/22/2021 0847   BILITOT 0.6 12/22/2021 0847   BILITOT 0.4 12/15/2021 1107   GFRNONAA >60 12/22/2021 0847   GFRAA >60 09/27/2019 2026     CBC    Component Value Date/Time   WBC 9.5 12/24/2021 0607   RBC 3.36 (L) 12/24/2021 0607   HGB 9.1 (L) 12/24/2021 0607   HGB 12.5 12/15/2021 1107   HCT 28.3 (L) 12/24/2021 0607   HCT 37.4 12/15/2021 1107   PLT 142 (L) 12/24/2021 0607   PLT 191 12/15/2021 1107   MCV 84.2 12/24/2021 0607   MCV 82 12/15/2021 1107   MCH 27.1 12/24/2021 0607   MCHC 32.2 12/24/2021 0607   RDW 15.3 12/24/2021 0607   RDW 14.4 12/15/2021 1107   LYMPHSABS 1.0 12/15/2021 1107   MONOABS 0.7 11/20/2021 1555  EOSABS 0.0 12/15/2021 1107   BASOSABS 0.0 12/15/2021 1107     Diabetic Labs (most recent): Lab Results  Component Value Date   HGBA1C 5.4 11/27/2021   HGBA1C 9.6 (A) 11/27/2021    Lipid Panel  No results found for: "CHOL", "TRIG", "HDL", "CHOLHDL", "VLDL", "LDLCALC", "LDLDIRECT", "LABVLDL"   Lab Results  Component Value Date   TSH <0.010 (L) 04/28/2022   TSH <0.010 (L) 02/23/2022   TSH <0.005 (L) 01/18/2022   TSH 0.007 (L) 10/23/2021   TSH 0.077 (L) 11/13/2019   TSH 0.638 08/14/2019   TSH 0.737 08/14/2019   FREET4 0.89 04/28/2022   FREET4 0.66 02/23/2022   FREET4 1.85 (H) 01/18/2022   FREET4 0.56 (L) 08/14/2019     Thyroid US from 03/06/21 CLINICAL DATA:  22 year old female with a history of thyroid nodule   EXAM: THYROID ULTRASOUND   TECHNIQUE: Ultrasound examination of the thyroid gland and adjacent soft tissues was performed.   COMPARISON:  08/20/2019, 11/23/2019   FINDINGS: Parenchymal Echotexture: Normal   Isthmus: 0.2 cm   Right lobe: 4.3 cm x 1.0 cm x 1.2 cm   Left lobe: 5.2 cm x 1.6 cm x 2.5 cm   _________________________________________________________   Estimated total number of nodules >/= 1 cm: 1   Number of spongiform nodules >/=  2 cm not described below (TR1): 0   Number of mixed cystic  and solid nodules >/= 1.5 cm not described below (TR2): 0   _________________________________________________________   Nodule within the left thyroid measured 3.2 cm on the baseline study 08/20/2019, and now measures 3.7 cm. This remains TR 2 with mixed cystic and solid components. No internal calcifications. Nodule does not meet criteria for further surveillance or biopsy   No additional nodules.   No adenopathy   Recommendations follow those established by the new ACR TI-RADS criteria (J Am Coll Radiol 2017;14:587-595).   IMPRESSION: Unchanged appearance of the left-sided cystic/solid TR 2 thyroid nodule, previously 3.3 cm, currently 3.7 cm as above.     Electronically Signed   By: Corrie Mckusick D.O.   On: 03/06/2021 16:16   Thyroid US from 02/22/22 Heterogeneous thyroid.   3.2 cm TI-RADS category 2 nodule in the right thyroid, which does not meet criteria for biopsy.   ________________________________  [place] 1 (0 points): Benign- No FNA indication  TI-RADS 2 (2 points): Not suspicious- No FNA indicated  TI-RADS 3 (3 points): Mildly suspicious- FNA is > or = 2.5 cm, follow if > or = 1.5 cm  TI-RADS 4 (4-6 points): Moderately suspicious- FNA if > or = 1.5 or follow if > or = 1.0 cm  TI-RADS 5 (7 or more points): Highly suspicious- FNA if >=1.0 cm, follow if >=0.5 cm  NOTE:  The TI-RADS classification of thyroid nodules has been adopted to standardize risk stratification based on a common lexicon to inform practitioners about which nodules warrant biopsy.  The imaging criteria for TI-RADS criteria and documentation are available online at [email or web address] Narrative  EXAM: US THYROID  DATE: 02/22/2022  ACCESSION: [id number] UN  DICTATED: 02/22/2022 9:02 AM  INTERPRETATION LOCATION: [place]   CLINICAL INDICATION: 22 years old Female with THYROID NODULE  - E04.1 - Thyroid nodule   COMPARISON: None   TECHNIQUE:  Ultrasound views of  the thyroid were obtained using gray scale and limited color Doppler imaging.   FINDINGS:  Thyroid size:       Right thyroid: 3.9 x 1.4 x 1.2 cm  Left thyroid: 4.2 x 2.3 x 3.1 cm       Isthmus: 0.2 cm   Thyroid echotexture: Heterogenous   Nodule:    1  Size: 3.0 x 1.5 x 3.2 cm  Location: Right  Composition: mixed cystic and solid (1)  Echogenicity: Isoechoic (1)  Shape: Not taller than wide (0)  Margin: ill defined (0)  Echogenic foci: None (0)   ACR TI-RADS total points: 2  ACR TI-RADS risk category: TI-RADS 2  ACR TI-RADS recommendation: No further follow up needed Procedure Note  Olinger, Asa Lente, MD - 02/22/2022  Formatting of this note might be different from the original.  EXAM: US THYROID  DATE: 02/22/2022  ACCESSION: [id number] UN  DICTATED: 02/22/2022 9:02 AM  INTERPRETATION LOCATION: [facility]   CLINICAL INDICATION: 22 years old Female with THYROID NODULE  - E04.1 - Thyroid nodule   COMPARISON: None   TECHNIQUE:  Ultrasound views of the thyroid were obtained using gray scale and limited color Doppler imaging.   FINDINGS:  Thyroid size:       Right thyroid: 3.9 x 1.4 x 1.2 cm       Left thyroid: 4.2 x 2.3 x 3.1 cm       Isthmus: 0.2 cm   Thyroid echotexture: Heterogenous   Nodule:    1  Size: 3.0 x 1.5 x 3.2 cm  Location: Right  Composition: mixed cystic and solid (1)  Echogenicity: Isoechoic (1)  Shape: Not taller than wide (0)  Margin: ill defined (0)  Echogenic foci: None (0)   ACR TI-RADS total points: 2  ACR TI-RADS risk category: TI-RADS 2  ACR TI-RADS recommendation: No further follow up needed   IMPRESSION:  Heterogeneous thyroid.   3.2 cm TI-RADS category 2 nodule in the right thyroid, which does not meet criteria for biopsy.   ________________________________  Allene Pyo 1 (0 points): Benign- No FNA indication  TI-RADS 2 (2 points): Not suspicious- No FNA indicated  TI-RADS 3 (3 points): Mildly suspicious-  FNA is > or = 2.5 cm, follow if > or = 1.5 cm  TI-RADS 4 (4-6 points): Moderately suspicious- FNA if > or = 1.5 or follow if > or = 1.0 cm  TI-RADS 5 (7 or more points): Highly suspicious- FNA if >=1.0 cm, follow if >=0.5 cm  NOTE:  The TI-RADS classification of thyroid nodules has been adopted to standardize risk stratification based on a common lexicon to inform practitioners about which nodules warrant biopsy.  The imaging criteria for TI-RADS criteria and documentation are available online at [email or web address]/ Exam End: 02/22/22 09:03   Specimen Collected: 02/22/22 09:02      Latest Reference Range & Units 08/14/19 09:37 11/13/19 15:41 10/23/21 11:54 01/18/22 14:38 02/23/22 09:56 04/28/22 11:10  TSH 0.350 - 4.500 uIU/mL 0.638 0.077 (L) 0.007 (L) <0.005 (L) <0.010 (L) <0.010 (L)  Triiodothyronine,Free,Serum 2.0 - 4.4 pg/mL    7.2 (H) 4.9 (H) 5.9 (H)  T4,Free(Direct) 0.61 - 1.12 ng/dL 8.58 (L)   8.50 (H) 2.77 0.89  T4,Free (Direct) 0.82 - 1.77 ng/dL   4.12     Thyroxine (T4) 4.5 - 12.0 ug/dL  5.8      Free Thyroxine Index 1.2 - 4.9   1.6      Thyroperoxidase Ab SerPl-aCnc 0 - 34 IU/mL    <9    Thyroglobulin Antibody 0.0 - 0.9 IU/mL    <1.0    T3 Uptake Ratio 24 - 39 %  28      (  L): Data is abnormally low (H): Data is abnormally high  Assessment & Plan:   1. Thyroid nodule 2. Abnormal TSH 3. Hyperthyroidism  she is being seen at a kind request of Patient, No Pcp Per.  her history and most recent labs are reviewed, and she was examined clinically. Subjective and objective findings are inconsistent with thyrotoxicosis from primary hyperthyroidism. However, the potential risks of untreated thyrotoxicosis and the need for definitive therapy have been discussed in detail with her, and she agrees to proceed with diagnostic workup and treatment plan.   Her repeat thyroid function tests are stable with still slightly suppressed TSH and low normal Free T4 and slightly  elevated Free T3 (slightly higher than last check in October).  Her antibody testing was negative, ruling out autoimmune thyroid dysfunction.  She is not at a point where she needs antithyroid treatment nor thyroid hormone replacement.  She will need labs again in 3 months for surveillance.   We did discuss the potential need to have uptake and scan if labs do not improve, but she is still breastfeeding.  The uptake and scan will be helpful to see if the nodule is toxic (producing too much hormone) and help guide her treatment moving forward.  She knows to call me back once she plans to stop breastfeeding so we can proceed with additional testing.  Options of therapy are discussed with her.  We discussed the option of treating it with medications including methimazole or PTU which may have side effects including rash, transaminitis, and bone marrow suppression.  We also discussed the option of definitive therapy with RAI ablation of the thyroid. If she is found to have primary hyperthyroidism from Graves' disease , toxic multinodular goiter or toxic nodular goiter the preferred modality of treatment would be I-131 thyroid ablation. Surgery is another choice of treatment in some cases, in her case surgery is not a good fit for presentation with only mild goiter.  -Patient is made aware of the high likelihood of post ablative hypothyroidism with subsequent need for lifelong thyroid hormone replacement. sheunderstands this outcome and she is  willing to proceed.        -Patient is advised to maintain close follow up with Patient, No Pcp Per for primary care needs.   I spent 20 minutes dedicated to the care of this patient on the date of this encounter to include pre-visit review of records, face-to-face time with the patient, and post visit ordering of testing.  Sara Henson  participated in the discussions, expressed understanding, and voiced agreement with the above plans.  All questions were answered  to her satisfaction. she is encouraged to contact clinic should she have any questions or concerns prior to her return visit.  Follow up plan: Return in about 3 months (around 08/03/2022) for Thyroid follow up, Previsit labs.   Thank you for involving me in the care of this pleasant patient, and I will continue to update you with her progress.    Ronny Bacon, [facility] [address] [place], [place] [address] Phone: [phone] Fax: [phone]  05/04/2022, 10:05 AM

## 2022-05-11 ENCOUNTER — Telehealth: Payer: Self-pay | Admitting: *Deleted

## 2022-05-11 NOTE — Telephone Encounter (Signed)
Talked with Sara Henson , she states that over the last week and a half she has experienced the following: Nausea , Irritability , Tiredness, Hot ,keeping home very cold, light headiness.  Patient states that she was to call Loree Fee if she had any changes in her condition. She was advised that this would be sent to [facility] and once addressed either Whitney or myself would call her back.

## 2022-05-11 NOTE — Telephone Encounter (Signed)
Talked with Sara Henson , giving her the two options that was recommended by Whitney. The patient ask ,what does Whitney feel is the best option for me at this point? She would like a call back with that response and take it from there.  Patient was advised that our office was closing at 2 PM and that it may be tomorrow before we could get back with her. She verbalized understanding.

## 2022-05-11 NOTE — Telephone Encounter (Signed)
Her thyroid may be the culprit.  Her labs were slightly off last visit around 2-3 weeks ago.  If the symptoms are troublesome, we can try putting her on a short course of oral steroids (Prednisone) which can help slow the thyroid down temporarily.  This will especially help if there is inflammation (tenderness or puffiness) in her neck area (larger than normal).  The other option would be to use a low dose Methimazole 5 mg daily (which should not affect her breastfeeding and does not pose health risk to the child) to slow the thyroid down until we can safely do the uptake and scan in the future.  We have been postponing that until she is finished breast feeding.

## 2022-05-12 MED ORDER — METHIMAZOLE 5 MG PO TABS: 5.0000 mg | ORAL_TABLET | Freq: Every day | ORAL | 3 refills | Status: DC

## 2022-05-12 MED ORDER — PREDNISONE 10 MG PO TABS: 10.0000 mg | ORAL_TABLET | Freq: Every day | ORAL | 0 refills | Status: DC

## 2022-05-12 NOTE — Addendum Note (Signed)
Addended by: Brita Romp on: 05/12/2022 09:58 AM   Modules accepted: Orders

## 2022-05-12 NOTE — Telephone Encounter (Signed)
Ok, I will send in for the Methimazole 5 mg po daily (tell her not to pick up the Prednisone which I had already sent in). Once she finishes breastfeeding, we can stop the Methimazole and proceed with getting the uptake and scan done.  Until then, we can plan to repeat labs to see how she is responding to the Methimazole in about 2 months (so we will need to move her appt up some).

## 2022-05-12 NOTE — Telephone Encounter (Signed)
2 months from now

## 2022-05-12 NOTE — Telephone Encounter (Signed)
Talked with Sara Henson. She states that she and her husband talked last night. She says that she got bad in a shirt amount of time, and she would not want to get better with the Prednisone , then get bad again. She would like to start the Methimazole take that and get set up for the uptake and scan.

## 2022-05-12 NOTE — Telephone Encounter (Signed)
Patient called and made aware. We need to move patient's appointment up soon per Whitney.She will be starting the Methimazole today or tomorrow.

## 2022-05-12 NOTE — Telephone Encounter (Signed)
Lets try a short course of oral steroids first.  That can help with any acute inflammation she may be experiencing along with those side effects and kick the thyroid into working properly for a bit longer.  I will send in for Prednisone 10 mg po once daily at breakfast for 15 days.  Then we can plan on doing our routine follow up in April and go from there.  She can always reach out in the mean time with concerns.

## 2022-05-27 ENCOUNTER — Telehealth: Payer: Self-pay

## 2022-05-27 NOTE — Telephone Encounter (Signed)
Pt called after hour nurse 05/26/22 7:45pm c/o is breastfeeding and has gotten a cold - cough, runny nose, congestion; what meds are safe to take while breastfeeding?  [phone] After hour nurse gave adv per Common Cold Adult guideline and Breastfeeding - Mother's Medicines and Diet pediatric guidelines.  [place]

## 2022-05-31 NOTE — Telephone Encounter (Signed)
Called to f/u with pt; states both of her kids tested positive for RSV; everyone, herself included, is a lot better now.  Pt appreciative of call.

## 2022-07-03 ENCOUNTER — Other Ambulatory Visit
Admit: 2022-07-03 | Discharge: 2022-07-03 | Disposition: A | Discharge status: Home / Self Care | Payer: Medicaid Other | Source: Ambulatory Visit | Attending: Family Medicine | Admitting: Family Medicine

## 2022-07-03 DIAGNOSIS — E041 Nontoxic single thyroid nodule: Secondary | ICD-10-CM | POA: Diagnosis not present

## 2022-07-03 DIAGNOSIS — E7989 Other specified disorders of purine and pyrimidine metabolism: Secondary | ICD-10-CM | POA: Diagnosis not present

## 2022-07-03 LAB — TSH: TSH: 0.02 u[IU]/mL — ABNORMAL LOW (ref 0.350–4.500)

## 2022-07-03 LAB — T4, FREE: Free T4: 0.59 ng/dL — ABNORMAL LOW (ref 0.61–1.12)

## 2022-07-05 ENCOUNTER — Telehealth: Payer: Self-pay | Admitting: Nurse Practitioner

## 2022-07-05 MED ORDER — PROPRANOLOL HCL 20 MG PO TABS: 20.0000 mg | ORAL_TABLET | Freq: Two times a day (BID) | ORAL | 2 refills | Status: DC

## 2022-07-05 NOTE — Telephone Encounter (Signed)
Left vm for pt to sch a appt this week per Whitney- ok to [place]

## 2022-07-05 NOTE — Telephone Encounter (Signed)
Sara Henson states that she called the nurse line over the weekend. She is having several thyroid symptoms. Irritability, and this has been to the extreme per patient. Nausea, Sensitive to the heat.  Extra Thirsty, cannot get enough water to drink. Rages of anger. Over the weekend while lying in bed, she experienced tunnel vision , hearing felt like it was leaving. She layed there until it passed. She has a rash on her inner elbow. Weigh gain of 10 -15 lbs.  She is still taking her medication, she has felt better yesterday and today. She has had her lab work done, and her next appointment is 07/12/2022.  Patient was told by nurse line to call and see if she could be seen sooner that the 11th.

## 2022-07-05 NOTE — Telephone Encounter (Signed)
Instead of discussing her recent symptoms over the phone, can we just schedule to have her come in?  That way I can get a baseline blood pressure on her as well given her symptoms.

## 2022-07-05 NOTE — Telephone Encounter (Signed)
Did you see my message I sent you earlier about her?

## 2022-07-06 ENCOUNTER — Telehealth: Payer: Self-pay | Admitting: *Deleted

## 2022-07-06 ENCOUNTER — Ambulatory Visit (INDEPENDENT_AMBULATORY_CARE_PROVIDER_SITE_OTHER): Payer: Medicaid Other | Admitting: Nurse Practitioner

## 2022-07-06 VITALS — BP 120/74 | HR 84 | Ht 67.0 in | Wt 202.4 lb

## 2022-07-06 DIAGNOSIS — R7989 Other specified abnormal findings of blood chemistry: Secondary | ICD-10-CM

## 2022-07-06 DIAGNOSIS — E0590 Thyrotoxicosis, unspecified without thyrotoxic crisis or storm: Secondary | ICD-10-CM

## 2022-07-06 DIAGNOSIS — E041 Nontoxic single thyroid nodule: Secondary | ICD-10-CM | POA: Diagnosis not present

## 2022-07-06 LAB — T3, FREE: T3, Free: 3.1 pg/mL (ref 2.0–4.4)

## 2022-07-06 NOTE — Telephone Encounter (Signed)
Patient left a message asking , if she has the uptake and she needs treatment , and she understood that this would be shortly after the upstage and staging. She is wanting to know the time frame? Would it be that next week , or within the month?  She would appreciate a call back.

## 2022-07-06 NOTE — Progress Notes (Signed)
07/06/2022     Endocrinology Follow Up Note    TELEHEALTH VISIT: The patient is being engaged in telehealth visit.  This type of visit limits physical examination significantly, and thus is not preferable over face-to-face encounters.  I connected with  Sara Henson on 07/06/22 by a video enabled telemedicine application and verified that I am speaking with the correct person using two identifiers.   I discussed the limitations of evaluation and management by telemedicine. The patient expressed understanding and agreed to proceed.    The participants involved in this visit include: Brita Romp, NP located at [facility] and Sara Henson  located at their personal residence listed.    Subjective:    Patient ID: Sara Henson, female    DOB: 2001-03-25, PCP Brita Romp, NP.   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   Had inpatient treatment at [facility] perinatal pysch    Past Surgical History:  Procedure Laterality Date   CESAREAN SECTION N/A 09/28/2019   Procedure: CESAREAN SECTION;  Surgeon: Malachy Mood, MD;  Location: [facility] ORS;  Service: Obstetrics;  Laterality: N/A;  [id number] [id number],9 weight 9lbs 11oz   COLPOSCOPY     TONSILLECTOMY     TYMPANOSTOMY TUBE PLACEMENT      Social History   Socioeconomic History   Marital status: Married    Spouse name: Winferd Humphrey   Number of children: 0   Years of education: 13   Highest education level: High school graduate  Occupational History   Occupation: Unemployed  Tobacco Use   Smoking status: Never   Smokeless tobacco: Former    Types: Nurse, children's Use: Former   Devices: quit 2020  Substance and Sexual Activity   Alcohol use: Not Currently   Drug use: No   Sexual activity: Yes   Other Topics Concern   Not on file  Social History Narrative   Not on file   Social Determinants of Health   Financial Resource Strain: Not on file  Food Insecurity: Not on file  Transportation Needs: Not on file  Physical Activity: Not on file  Stress: Not on file  Social Connections: Not on file    Family History  Problem Relation Age of Onset   Skin cancer Father     Outpatient Encounter Medications as of 07/06/2022  Medication Sig   FLUoxetine (PROZAC) 20 MG capsule Take 80 mg by mouth every morning.   methimazole (TAPAZOLE) 5 MG tablet Take 1 tablet (5 mg total) by mouth daily.   dicloxacillin (DYNAPEN) 500 MG capsule Take 1 capsule (500 mg total) by mouth 4 (four) times daily. (Patient not taking: Reported on 05/04/2022)   propranolol (INDERAL) 20 MG tablet Take 1 tablet (20 mg total) by mouth 2 (two) times daily. (Patient not taking: Reported on 07/06/2022)   No facility-administered encounter medications on file as of 07/06/2022.    ALLERGIES: No Known Allergies  VACCINATION STATUS: Immunization History  Administered Date(s) Administered   Influenza,inj,Quad PF,6+ Mos 03/07/2019   Tdap 07/16/2019, 10/07/2021     HPI  Sara Henson is 22 y.o. female who presents today with a medical history as above. she is being seen in follow up after being seen in consultation for hyperthyroidism.  she has been dealing with  symptoms of anxiety, insomnia, intermittent palpitations on and off for 2 years (starting in 2020/2021). These symptoms are progressively worsening and troubling to her.   she does complain of feeling lump in left neck, which is worse when laying down on her side at night making it uncomfortable to swallow at times.  She denies choking, shortness of breath, no recent voice change. She notes she had similar problems during her first pregnancy.   07/06/22- Patient complains of worsening in symptoms, says they come in waves.  She notes dizziness, hot flashes,  palpitations, difficulty swallowing (worsening), and irritability.  She is still breastfeeding and taking her low dose Methimazole as prescribed.  She is willing to put her breastfeeding on hold vs stop altogether to get her health in order.  Review of systems  Constitutional: + Minimally fluctuating body weight, current Body mass index is 31.7 kg/m., + fatigue, + subjective hyperthermia, no subjective hypothermia Eyes: no blurry vision, no xerophthalmia ENT: no sore throat, + nodules palpated in throat, + increasing dysphagia/odynophagia (still intermittent but continuing to worsen), no hoarseness Cardiovascular: no chest pain, no shortness of breath, + intermittent palpitations, no leg swelling Respiratory: no cough, no shortness of breath Gastrointestinal: no nausea/vomiting/diarrhea Musculoskeletal: no muscle/joint aches Skin: no rashes, no hyperemia Neurological: no tremors, no numbness, no tingling, no dizziness Psychiatric: no depression, + anxiety, + insomnia   Objective:    BP 120/74 (BP Location: Left Arm, Patient Position: Sitting, Cuff Size: Normal)   Pulse 84   Ht '5\' 7"'$  (1.702 m)   Wt 202 lb 6.4 oz (91.8 kg)   BMI 31.70 kg/m   Wt Readings from Last 3 Encounters:  07/06/22 202 lb 6.4 oz (91.8 kg)  05/04/22 195 lb (88.5 kg)  02/12/22 190 lb (86.2 kg)     BP Readings from Last 3 Encounters:  07/06/22 120/74  02/12/22 119/82  01/18/22 116/74                         Physical Exam- Limited  Constitutional:  Body mass index is 31.7 kg/m. , not in acute distress, mildly anxious state of mind Eyes:  EOMI, no exophthalmos Musculoskeletal: no gross deformities, strength intact in all four extremities, no gross restriction of joint movements Skin:  no rashes, no hyperemia Neurological: no tremor with outstretched hands   CMP     Component Value Date/Time   NA 137 12/22/2021 0847   NA 139 12/15/2021 1107   K 3.5 12/22/2021 0847   CL 109 12/22/2021 0847   CO2 23  12/22/2021 0847   GLUCOSE 111 (H) 12/22/2021 0847   BUN 8 12/22/2021 0847   BUN 5 (L) 12/15/2021 1107   CREATININE 0.53 12/22/2021 0847   CALCIUM 9.0 12/22/2021 0847   PROT 6.0 (L) 12/22/2021 0847   PROT 5.7 (L) 12/15/2021 1107   ALBUMIN 3.1 (L) 12/22/2021 0847   ALBUMIN 3.7 (L) 12/15/2021 1107   AST 20 12/22/2021 0847   ALT 16 12/22/2021 0847   ALKPHOS 143 (H) 12/22/2021 0847   BILITOT 0.6 12/22/2021 0847   BILITOT 0.4 12/15/2021 1107   GFRNONAA >60 12/22/2021 0847   GFRAA >60 09/27/2019 2026     CBC    Component Value Date/Time   WBC 9.5 12/24/2021 0607   RBC 3.36 (L) 12/24/2021 0607   HGB 9.1 (L) 12/24/2021 0607   HGB 12.5 12/15/2021 1107   HCT 28.3 (L) 12/24/2021 0607   HCT 37.4 12/15/2021 1107   PLT  142 (L) 12/24/2021 0607   PLT 191 12/15/2021 1107   MCV 84.2 12/24/2021 0607   MCV 82 12/15/2021 1107   MCH 27.1 12/24/2021 0607   MCHC 32.2 12/24/2021 0607   RDW 15.3 12/24/2021 0607   RDW 14.4 12/15/2021 1107   LYMPHSABS 1.0 12/15/2021 1107   MONOABS 0.7 11/20/2021 1555   EOSABS 0.0 12/15/2021 1107   BASOSABS 0.0 12/15/2021 1107     Diabetic Labs (most recent): Lab Results  Component Value Date   HGBA1C 5.4 11/27/2021   HGBA1C 9.6 (A) 11/27/2021    Lipid Panel  No results found for: "CHOL", "TRIG", "HDL", "CHOLHDL", "VLDL", "LDLCALC", "LDLDIRECT", "LABVLDL"   Lab Results  Component Value Date   TSH 0.020 (L) 07/03/2022   TSH <0.010 (L) 04/28/2022   TSH <0.010 (L) 02/23/2022   TSH <0.005 (L) 01/18/2022   TSH 0.007 (L) 10/23/2021   TSH 0.077 (L) 11/13/2019   TSH 0.638 08/14/2019   TSH 0.737 08/14/2019   FREET4 0.59 (L) 07/03/2022   FREET4 0.89 04/28/2022   FREET4 0.66 02/23/2022   FREET4 1.85 (H) 01/18/2022   FREET4 0.56 (L) 08/14/2019     Thyroid US from 03/06/21 CLINICAL DATA:  22 year old female with a history of thyroid nodule   EXAM: THYROID ULTRASOUND   TECHNIQUE: Ultrasound examination of the thyroid gland and adjacent soft tissues  was performed.   COMPARISON:  08/20/2019, 11/23/2019   FINDINGS: Parenchymal Echotexture: Normal   Isthmus: 0.2 cm   Right lobe: 4.3 cm x 1.0 cm x 1.2 cm   Left lobe: 5.2 cm x 1.6 cm x 2.5 cm   _________________________________________________________   Estimated total number of nodules >/= 1 cm: 1   Number of spongiform nodules >/=  2 cm not described below (TR1): 0   Number of mixed cystic and solid nodules >/= 1.5 cm not described below (TR2): 0   _________________________________________________________   Nodule within the left thyroid measured 3.2 cm on the baseline study 08/20/2019, and now measures 3.7 cm. This remains TR 2 with mixed cystic and solid components. No internal calcifications. Nodule does not meet criteria for further surveillance or biopsy   No additional nodules.   No adenopathy   Recommendations follow those established by the new ACR TI-RADS criteria (J Am Coll Radiol 2017;14:587-595).   IMPRESSION: Unchanged appearance of the left-sided cystic/solid TR 2 thyroid nodule, previously 3.3 cm, currently 3.7 cm as above.     Electronically Signed   By: Corrie Mckusick D.O.   On: 03/06/2021 16:16   Thyroid US from 02/22/22 Heterogeneous thyroid.   3.2 cm TI-RADS category 2 nodule in the right thyroid, which does not meet criteria for biopsy.   ________________________________  [place] 1 (0 points): Benign- No FNA indication  TI-RADS 2 (2 points): Not suspicious- No FNA indicated  TI-RADS 3 (3 points): Mildly suspicious- FNA is > or = 2.5 cm, follow if > or = 1.5 cm  TI-RADS 4 (4-6 points): Moderately suspicious- FNA if > or = 1.5 or follow if > or = 1.0 cm  TI-RADS 5 (7 or more points): Highly suspicious- FNA if >=1.0 cm, follow if >=0.5 cm  NOTE:  The TI-RADS classification of thyroid nodules has been adopted to standardize risk stratification based on a common lexicon to inform practitioners about which nodules warrant biopsy.  The imaging  criteria for TI-RADS criteria and documentation are available online at [email or web address] Narrative  EXAM: US THYROID  DATE: 02/22/2022  ACCESSION: [id number]  DICTATED: 02/22/2022 9:02  AM  INTERPRETATION LOCATION: [place]   CLINICAL INDICATION: 22 years old Female with THYROID NODULE  - E04.1 - Thyroid nodule   COMPARISON: None   TECHNIQUE:  Ultrasound views of the thyroid were obtained using gray scale and limited color Doppler imaging.   FINDINGS:  Thyroid size:       Right thyroid: 3.9 x 1.4 x 1.2 cm       Left thyroid: 4.2 x 2.3 x 3.1 cm       Isthmus: 0.2 cm   Thyroid echotexture: Heterogenous   Nodule:    1  Size: 3.0 x 1.5 x 3.2 cm  Location: Right  Composition: mixed cystic and solid (1)  Echogenicity: Isoechoic (1)  Shape: Not taller than wide (0)  Margin: ill defined (0)  Echogenic foci: None (0)   ACR TI-RADS total points: 2  ACR TI-RADS risk category: TI-RADS 2  ACR TI-RADS recommendation: No further follow up needed Procedure Note  Olinger, Serita Sheller, MD - 02/22/2022  Formatting of this note might be different from the original.  EXAM: US THYROID  DATE: 02/22/2022  ACCESSION: [id number]  DICTATED: 02/22/2022 9:02 AM  INTERPRETATION LOCATION: [place]   CLINICAL INDICATION: 22 years old Female with THYROID NODULE  - E04.1 - Thyroid nodule   COMPARISON: None   TECHNIQUE:  Ultrasound views of the thyroid were obtained using gray scale and limited color Doppler imaging.   FINDINGS:  Thyroid size:       Right thyroid: 3.9 x 1.4 x 1.2 cm       Left thyroid: 4.2 x 2.3 x 3.1 cm       Isthmus: 0.2 cm   Thyroid echotexture: Heterogenous   Nodule:    1  Size: 3.0 x 1.5 x 3.2 cm  Location: Right  Composition: mixed cystic and solid (1)  Echogenicity: Isoechoic (1)  Shape: Not taller than wide (0)  Margin: ill defined (0)  Echogenic foci: None (0)   ACR TI-RADS total points: 2  ACR  TI-RADS risk category: TI-RADS 2  ACR TI-RADS recommendation: No further follow up needed   IMPRESSION:  Heterogeneous thyroid.   3.2 cm TI-RADS category 2 nodule in the right thyroid, which does not meet criteria for biopsy.   ________________________________  [place] 1 (0 points): Benign- No FNA indication  TI-RADS 2 (2 points): Not suspicious- No FNA indicated  TI-RADS 3 (3 points): Mildly suspicious- FNA is > or = 2.5 cm, follow if > or = 1.5 cm  TI-RADS 4 (4-6 points): Moderately suspicious- FNA if > or = 1.5 or follow if > or = 1.0 cm  TI-RADS 5 (7 or more points): Highly suspicious- FNA if >=1.0 cm, follow if >=0.5 cm  NOTE:  The TI-RADS classification of thyroid nodules has been adopted to standardize risk stratification based on a common lexicon to inform practitioners about which nodules warrant biopsy.  The imaging criteria for TI-RADS criteria and documentation are available online at [email or web address] Exam End: 02/22/22 09:03   Specimen Collected: 02/22/22 09:02      Latest Reference Range & Units 10/23/21 11:54 01/18/22 14:38 02/23/22 09:56 04/28/22 11:10 07/03/22 17:59  TSH 0.350 - 4.500 uIU/mL 0.007 (L) <0.005 (L) <0.010 (L) <0.010 (L) 0.020 (L)  Triiodothyronine,Free,Serum 2.0 - 4.4 pg/mL  7.2 (H) 4.9 (H) 5.9 (H) 3.1  T4,Free(Direct) 0.61 - 1.12 ng/dL  1.85 (H) 0.66 0.89 0.59 (L)  T4,Free (Direct) 0.82 - 1.77 ng/dL 0.93      Thyroperoxidase  Ab SerPl-aCnc 0 - 34 IU/mL  <9     Thyroglobulin Antibody 0.0 - 0.9 IU/mL  <1.0     (L): Data is abnormally low (H): Data is abnormally high  Assessment & Plan:   1. Thyroid nodule 2. Abnormal TSH 3. Hyperthyroidism  she is being seen at a kind request of Brita Romp, NP.  her history and most recent labs are reviewed, and she was examined clinically. Subjective and objective findings are inconsistent with thyrotoxicosis from primary hyperthyroidism. However, the potential risks of untreated  thyrotoxicosis and the need for definitive therapy have been discussed in detail with her, and she agrees to proceed with diagnostic workup and treatment plan.  Her antibody testing was negative, ruling out autoimmune thyroid dysfunction.   Her repeat thyroid function tests are stable with still slightly suppressed TSH and low Free T4 and now normal Free T3, evidence that the Methimazole is working.   Given the severity of her symptoms, she is ready to quit breastfeeding if necessary to get the uptake and scan done and definitive therapy if needed to get her health back in order.  I advised her to stop her Methimazole as she will have to be off this medication for 5 days to ensure it does not interfere with uptake and scan results.  I will go ahead and enter the order for uptake and scan and verify with radiology about the length of time needed to stop breastfeeding.  Will also order thyroid ultrasound to be performed on same day given her worsening compression symptoms such as dysphagia (especially with pills).    -Patient is advised to maintain close follow up with Brita Romp, NP for primary care needs.    I spent  40  minutes in the care of the patient today including review of labs from Thyroid Function, CMP, and other relevant labs ; imaging/biopsy records (current and previous including abstractions from other facilities); face-to-face time discussing  her lab results and symptoms, medications doses, her options of short and long term treatment based on the latest standards of care / guidelines;   and documenting the encounter.  Sara Henson  participated in the discussions, expressed understanding, and voiced agreement with the above plans.  All questions were answered to her satisfaction. she is encouraged to contact clinic should she have any questions or concerns prior to her return visit.  Follow up plan: Return will call with test results.   Thank you for involving me in the  care of this pleasant patient, and I will continue to update you with her progress.    Rayetta Pigg, [facility] [address] [place], [place] [address] Phone: [phone] Fax: [phone]  07/06/2022, 10:01 AM

## 2022-07-06 NOTE — Telephone Encounter (Signed)
Noted  

## 2022-07-06 NOTE — Telephone Encounter (Signed)
The uptake and scan will likely be scheduled within the next week or 2, depending on availability with the radiology department.  If her uptake and scan shows something to treat, I will go ahead and enter the order for it to be done at her earliest convenience.  Obviously we will have to talk about timing a bit more and figure that out as she will have to isolate herself for about 5 days after any RAI treatment.  But, lets cross that bridge if/when we get there.  I have called her and gone over all of this information with her.

## 2022-07-08 ENCOUNTER — Telehealth: Payer: Self-pay

## 2022-07-08 NOTE — Telephone Encounter (Signed)
Left msg for pt to call back

## 2022-07-08 NOTE — Telephone Encounter (Signed)
She can move forward with the ultrasound which we also ordered at last visit in addition to the uptake and scan and just hold off on the uptake and scan until I can gain more information regarding safety in breastfeeding.  I still have not heard anything back from the radiologist I reached out to (I have sent him an email communication today to see if he responds better to that).

## 2022-07-08 NOTE — Telephone Encounter (Signed)
Left msg to call back.

## 2022-07-08 NOTE — Telephone Encounter (Signed)
Sara Henson is requesting a call from [place]. She has concerns about her thyroid. She states she is having swallowing issues. An uptake/scan was ordered but she is asking of any alternatives she might have such as repeat ultrasound, or getting a biopsy approved. She states that her baby is strictly breast fed and will not take a bottle which is the main reason she is questioning the uptake/scan.

## 2022-07-12 ENCOUNTER — Ambulatory Visit: Payer: Medicaid Other

## 2022-07-12 ENCOUNTER — Ambulatory Visit: Payer: Medicaid Other | Admitting: Nurse Practitioner

## 2022-07-16 ENCOUNTER — Ambulatory Visit
Admission: RE | Admit: 2022-07-16 | Discharge: 2022-07-16 | Disposition: A | Discharge status: Home / Self Care | Payer: Medicaid Other | Source: Ambulatory Visit | Attending: Nurse Practitioner | Admitting: Nurse Practitioner

## 2022-07-16 DIAGNOSIS — E041 Nontoxic single thyroid nodule: Secondary | ICD-10-CM | POA: Diagnosis present

## 2022-07-19 ENCOUNTER — Encounter
Admission: RE | Admit: 2022-07-19 | Discharge: 2022-07-19 | Disposition: A | Discharge status: Home / Self Care | Payer: Medicaid Other | Source: Ambulatory Visit | Attending: Nurse Practitioner | Admitting: Nurse Practitioner

## 2022-07-19 DIAGNOSIS — E041 Nontoxic single thyroid nodule: Secondary | ICD-10-CM | POA: Insufficient documentation

## 2022-07-19 DIAGNOSIS — E0590 Thyrotoxicosis, unspecified without thyrotoxic crisis or storm: Secondary | ICD-10-CM | POA: Diagnosis not present

## 2022-07-19 DIAGNOSIS — D34 Benign neoplasm of thyroid gland: Secondary | ICD-10-CM | POA: Insufficient documentation

## 2022-07-19 DIAGNOSIS — R7989 Other specified abnormal findings of blood chemistry: Secondary | ICD-10-CM | POA: Diagnosis not present

## 2022-07-19 MED ORDER — SODIUM IODIDE I-123 7.4 MBQ CAPS
441.7000 | ORAL_CAPSULE | Freq: Once | ORAL | Status: AC
  Administered 2022-07-19: 441.7 via ORAL

## 2022-07-20 ENCOUNTER — Encounter
Admission: RE | Admit: 2022-07-20 | Discharge: 2022-07-20 | Disposition: A | Discharge status: Home / Self Care | Payer: Medicaid Other | Source: Ambulatory Visit | Attending: Nurse Practitioner | Admitting: Nurse Practitioner

## 2022-07-20 NOTE — Progress Notes (Signed)
Thank you :)

## 2022-07-20 NOTE — Progress Notes (Signed)
We need to get patient back in here for follow up appt to discuss results and plan moving forward.

## 2022-07-21 ENCOUNTER — Ambulatory Visit (INDEPENDENT_AMBULATORY_CARE_PROVIDER_SITE_OTHER): Payer: Medicaid Other | Admitting: Nurse Practitioner

## 2022-07-21 ENCOUNTER — Telehealth: Payer: Self-pay

## 2022-07-21 ENCOUNTER — Encounter: Payer: Self-pay | Admitting: Nurse Practitioner

## 2022-07-21 VITALS — BP 110/74 | HR 80 | Ht 67.0 in | Wt 202.6 lb

## 2022-07-21 DIAGNOSIS — E0590 Thyrotoxicosis, unspecified without thyrotoxic crisis or storm: Secondary | ICD-10-CM | POA: Diagnosis not present

## 2022-07-21 DIAGNOSIS — E0510 Thyrotoxicosis with toxic single thyroid nodule without thyrotoxic crisis or storm: Secondary | ICD-10-CM | POA: Diagnosis not present

## 2022-07-21 NOTE — Telephone Encounter (Signed)
There are several medications that can be prescribed, including bromocriptine and cabergoline.  Usually these meds will suppress lactation in a few days. She should also attempt breast binding, and some women find use of cabbage leaves inside of a sports bra for 2-3 days can also be helpful to suppress quickly.

## 2022-07-21 NOTE — Progress Notes (Signed)
07/21/2022     Endocrinology Follow Up Note    Subjective:    Patient ID: Sara Henson, female    DOB: Aug 16, 2000, PCP Brita Romp, NP.   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   Had inpatient treatment at [facility] perinatal pysch    Past Surgical History:  Procedure Laterality Date   CESAREAN SECTION N/A 09/28/2019   Procedure: CESAREAN SECTION;  Surgeon: Malachy Mood, MD;  Location: [facility] ORS;  Service: Obstetrics;  Laterality: N/A;  [id number] apgar-8,9 weight 9lbs 11oz   COLPOSCOPY     TONSILLECTOMY     TYMPANOSTOMY TUBE PLACEMENT      Social History   Socioeconomic History   Marital status: Married    Spouse name: Winferd Humphrey   Number of children: 0   Years of education: 13   Highest education level: High school graduate  Occupational History   Occupation: Unemployed  Tobacco Use   Smoking status: Never   Smokeless tobacco: Former    Types: Nurse, children's Use: Former   Devices: quit 2020  Substance and Sexual Activity   Alcohol use: Not Currently   Drug use: No   Sexual activity: Yes  Other Topics Concern   Not on file  Social History Narrative   Not on file   Social Determinants of Health   Financial Resource Strain: Not on file  Food Insecurity: Not on file  Transportation Needs: Not on file  Physical Activity: Not on file  Stress: Not on file  Social Connections: Not on file    Family History  Problem Relation Age of Onset   Skin cancer Father     Outpatient Encounter Medications as of 07/21/2022  Medication Sig   FLUoxetine (PROZAC) 20 MG capsule Take 80 mg by mouth every morning.   dicloxacillin (DYNAPEN) 500 MG capsule Take 1 capsule (500 mg total) by mouth 4 (four) times daily. (Patient not taking: Reported on 05/04/2022)    methimazole (TAPAZOLE) 5 MG tablet Take 1 tablet (5 mg total) by mouth daily. (Patient not taking: Reported on 07/21/2022)   propranolol (INDERAL) 20 MG tablet Take 1 tablet (20 mg total) by mouth 2 (two) times daily. (Patient not taking: Reported on 07/06/2022)   No facility-administered encounter medications on file as of 07/21/2022.    ALLERGIES: No Known Allergies  VACCINATION STATUS: Immunization History  Administered Date(s) Administered   Influenza,inj,Quad PF,6+ Mos 03/07/2019   Tdap 07/16/2019, 10/07/2021     HPI  Sara Henson is 22 y.o. female who presents today with a medical history as above. she is being seen in follow up after being seen in consultation for hyperthyroidism.  she has been dealing with symptoms of anxiety, insomnia, intermittent palpitations on and off for 2 years (starting in 2020/2021). These symptoms are progressively worsening and troubling to her.   she does complain of feeling lump in left neck, which is worse when laying down on her side at night making it uncomfortable to swallow at times.  She denies choking, shortness of breath, no recent voice change. She notes she had similar problems during her first pregnancy.   07/06/22- Patient complains of worsening in symptoms, says they come in waves.  She notes dizziness, hot flashes, palpitations, difficulty swallowing (worsening), and irritability.  She is  still breastfeeding and taking her low dose Methimazole as prescribed.  She is willing to put her breastfeeding on hold vs stop altogether to get her health in order.  07/21/22- Patient proceeded with ultrasound showing growing 4.1 cm left thyroid nodule still mixed cystic/solid, therefore did not meet criteria for biopsy.  She did go to [facility] for second opinion and talked with radiologist there to inquire about safety with breastfeeding.  I had previously reached out to radiology at [facility] and did not get a response regarding breastfeeding recommendations  with RAI for uptake and scan or ablation.  Given the information received, she decided to proceed with uptake and scan which I ordered at last visit as well which was completed yesterday showing left toxic thyroid adenoma (consistent with the 4.1 cm mass noted on ultrasound, and the remaining thyroid tissue had suppressed uptake.  She has been pumping and dumping her breast milk and has stayed away from her child for the last 3 days or so.  She was also referred to ENT (has appt coming up on 3/29) to discuss potential treatment options.   Review of systems  Constitutional: + Minimally fluctuating body weight, current Body mass index is 31.73 kg/m., + fatigue, + subjective hyperthermia, no subjective hypothermia Eyes: no blurry vision, no xerophthalmia ENT: no sore throat, + nodules palpated in throat, + increasing dysphagia/odynophagia (still intermittent but continuing to worsen), no hoarseness Cardiovascular: no chest pain, no shortness of breath, + intermittent palpitations, no leg swelling Respiratory: no cough, no shortness of breath Gastrointestinal: no nausea/vomiting/diarrhea Musculoskeletal: no muscle/joint aches Skin: no rashes, no hyperemia Neurological: no tremors, no numbness, no tingling, no dizziness Psychiatric: no depression, + anxiety, + insomnia   Objective:    BP 110/74 (BP Location: Left Arm, Patient Position: Sitting, Cuff Size: Large)   Pulse 80   Ht 5\' 7"  (1.702 m)   Wt 202 lb 9.6 oz (91.9 kg)   BMI 31.73 kg/m   Wt Readings from Last 3 Encounters:  07/21/22 202 lb 9.6 oz (91.9 kg)  07/06/22 202 lb 6.4 oz (91.8 kg)  05/04/22 195 lb (88.5 kg)     BP Readings from Last 3 Encounters:  07/21/22 110/74  07/06/22 120/74  02/12/22 119/82                        Physical Exam- Limited  Constitutional:  Body mass index is 31.73 kg/m. , not in acute distress, mildly anxious state of mind Eyes:  EOMI, no exophthalmos Musculoskeletal: no gross deformities, strength  intact in all four extremities, no gross restriction of joint movements Skin:  no rashes, no hyperemia Neurological: no tremor with outstretched hands   CMP     Component Value Date/Time   NA 137 12/22/2021 0847   NA 139 12/15/2021 1107   K 3.5 12/22/2021 0847   CL 109 12/22/2021 0847   CO2 23 12/22/2021 0847   GLUCOSE 111 (H) 12/22/2021 0847   BUN 8 12/22/2021 0847   BUN 5 (L) 12/15/2021 1107   CREATININE 0.53 12/22/2021 0847   CALCIUM 9.0 12/22/2021 0847   PROT 6.0 (L) 12/22/2021 0847   PROT 5.7 (L) 12/15/2021 1107   ALBUMIN 3.1 (L) 12/22/2021 0847   ALBUMIN 3.7 (L) 12/15/2021 1107   AST 20 12/22/2021 0847   ALT 16 12/22/2021 0847   ALKPHOS 143 (H) 12/22/2021 0847   BILITOT 0.6 12/22/2021 0847   BILITOT 0.4 12/15/2021 1107   GFRNONAA >60 12/22/2021 [id number]  GFRAA >60 09/27/2019 2026     CBC    Component Value Date/Time   WBC 9.5 12/24/2021 0607   RBC 3.36 (L) 12/24/2021 0607   HGB 9.1 (L) 12/24/2021 0607   HGB 12.5 12/15/2021 1107   HCT 28.3 (L) 12/24/2021 0607   HCT 37.4 12/15/2021 1107   PLT 142 (L) 12/24/2021 0607   PLT 191 12/15/2021 1107   MCV 84.2 12/24/2021 0607   MCV 82 12/15/2021 1107   MCH 27.1 12/24/2021 0607   MCHC 32.2 12/24/2021 0607   RDW 15.3 12/24/2021 0607   RDW 14.4 12/15/2021 1107   LYMPHSABS 1.0 12/15/2021 1107   MONOABS 0.7 11/20/2021 1555   EOSABS 0.0 12/15/2021 1107   BASOSABS 0.0 12/15/2021 1107     Diabetic Labs (most recent): Lab Results  Component Value Date   HGBA1C 5.4 11/27/2021   HGBA1C 9.6 (A) 11/27/2021    Lipid Panel  No results found for: "CHOL", "TRIG", "HDL", "CHOLHDL", "VLDL", "LDLCALC", "LDLDIRECT", "LABVLDL"   Lab Results  Component Value Date   TSH 0.020 (L) 07/03/2022   TSH <0.010 (L) 04/28/2022   TSH <0.010 (L) 02/23/2022   TSH <0.005 (L) 01/18/2022   TSH 0.007 (L) 10/23/2021   TSH 0.077 (L) 11/13/2019   TSH 0.638 08/14/2019   TSH 0.737 08/14/2019   FREET4 0.59 (L) 07/03/2022   FREET4 0.89  04/28/2022   FREET4 0.66 02/23/2022   FREET4 1.85 (H) 01/18/2022   FREET4 0.56 (L) 08/14/2019     Thyroid US from 03/06/21 CLINICAL DATA:  22 year old female with a history of thyroid nodule   EXAM: THYROID ULTRASOUND   TECHNIQUE: Ultrasound examination of the thyroid gland and adjacent soft tissues was performed.   COMPARISON:  08/20/2019, 11/23/2019   FINDINGS: Parenchymal Echotexture: Normal   Isthmus: 0.2 cm   Right lobe: 4.3 cm x 1.0 cm x 1.2 cm   Left lobe: 5.2 cm x 1.6 cm x 2.5 cm   _________________________________________________________   Estimated total number of nodules >/= 1 cm: 1   Number of spongiform nodules >/=  2 cm not described below (TR1): 0   Number of mixed cystic and solid nodules >/= 1.5 cm not described below (TR2): 0   _________________________________________________________   Nodule within the left thyroid measured 3.2 cm on the baseline study 08/20/2019, and now measures 3.7 cm. This remains TR 2 with mixed cystic and solid components. No internal calcifications. Nodule does not meet criteria for further surveillance or biopsy   No additional nodules.   No adenopathy   Recommendations follow those established by the new ACR TI-RADS criteria (J Am Coll Radiol 2017;14:587-595).   IMPRESSION: Unchanged appearance of the left-sided cystic/solid TR 2 thyroid nodule, previously 3.3 cm, currently 3.7 cm as above.     Electronically Signed   By: Corrie Mckusick D.O.   On: 03/06/2021 16:16   Thyroid US from 02/22/22 Heterogeneous thyroid.   3.2 cm TI-RADS category 2 nodule in the right thyroid, which does not meet criteria for biopsy.   ________________________________  [place] 1 (0 points): Benign- No FNA indication  TI-RADS 2 (2 points): Not suspicious- No FNA indicated  TI-RADS 3 (3 points): Mildly suspicious- FNA is > or = 2.5 cm, follow if > or = 1.5 cm  TI-RADS 4 (4-6 points): Moderately suspicious- FNA if > or = 1.5 or  follow if > or = 1.0 cm  TI-RADS 5 (7 or more points): Highly suspicious- FNA if >=1.0 cm, follow if >=0.5 cm  NOTE:  The TI-RADS classification of thyroid nodules has been adopted to standardize risk stratification based on a common lexicon to inform practitioners about which nodules warrant biopsy.  The imaging criteria for TI-RADS criteria and documentation are available online at [email or web address] Narrative  EXAM: US THYROID  DATE: 02/22/2022  ACCESSION: [id number]  DICTATED: 02/22/2022 9:02 AM  INTERPRETATION LOCATION: [place]   CLINICAL INDICATION: 22 years old Female with THYROID NODULE  - E04.1 - Thyroid nodule   COMPARISON: None   TECHNIQUE:  Ultrasound views of the thyroid were obtained using gray scale and limited color Doppler imaging.   FINDINGS:  Thyroid size:       Right thyroid: 3.9 x 1.4 x 1.2 cm       Left thyroid: 4.2 x 2.3 x 3.1 cm       Isthmus: 0.2 cm   Thyroid echotexture: Heterogenous   Nodule:    1  Size: 3.0 x 1.5 x 3.2 cm  Location: Right  Composition: mixed cystic and solid (1)  Echogenicity: Isoechoic (1)  Shape: Not taller than wide (0)  Margin: ill defined (0)  Echogenic foci: None (0)   ACR TI-RADS total points: 2  ACR TI-RADS risk category: TI-RADS 2  ACR TI-RADS recommendation: No further follow up needed Procedure Note  Olinger, Serita Sheller, MD - 02/22/2022  Formatting of this note might be different from the original.  EXAM: US THYROID  DATE: 02/22/2022  ACCESSION: [id number]  DICTATED: 02/22/2022 9:02 AM  INTERPRETATION LOCATION: [place]   CLINICAL INDICATION: 22 years old Female with THYROID NODULE  - E04.1 - Thyroid nodule   COMPARISON: None   TECHNIQUE:  Ultrasound views of the thyroid were obtained using gray scale and limited color Doppler imaging.   FINDINGS:  Thyroid size:       Right thyroid: 3.9 x 1.4 x 1.2 cm       Left thyroid: 4.2 x 2.3 x 3.1 cm        Isthmus: 0.2 cm   Thyroid echotexture: Heterogenous   Nodule:    1  Size: 3.0 x 1.5 x 3.2 cm  Location: Right  Composition: mixed cystic and solid (1)  Echogenicity: Isoechoic (1)  Shape: Not taller than wide (0)  Margin: ill defined (0)  Echogenic foci: None (0)   ACR TI-RADS total points: 2  ACR TI-RADS risk category: TI-RADS 2  ACR TI-RADS recommendation: No further follow up needed   IMPRESSION:  Heterogeneous thyroid.   3.2 cm TI-RADS category 2 nodule in the right thyroid, which does not meet criteria for biopsy.   ________________________________  [place] 1 (0 points): Benign- No FNA indication  TI-RADS 2 (2 points): Not suspicious- No FNA indicated  TI-RADS 3 (3 points): Mildly suspicious- FNA is > or = 2.5 cm, follow if > or = 1.5 cm  TI-RADS 4 (4-6 points): Moderately suspicious- FNA if > or = 1.5 or follow if > or = 1.0 cm  TI-RADS 5 (7 or more points): Highly suspicious- FNA if >=1.0 cm, follow if >=0.5 cm  NOTE:  The TI-RADS classification of thyroid nodules has been adopted to standardize risk stratification based on a common lexicon to inform practitioners about which nodules warrant biopsy.  The imaging criteria for TI-RADS criteria and documentation are available online at [email or web address] Exam End: 02/22/22 09:03   Specimen Collected: 02/22/22 09:02        Thyroid US from 07/16/22 CLINICAL DATA:  Palpable abnormality. follow up thyroid nodule-  worsening dysphagia   EXAM: THYROID ULTRASOUND   TECHNIQUE: Ultrasound examination of the thyroid gland and adjacent soft tissues was performed.   COMPARISON:  US Thyroid, most recently 03/06/2021 and 11/23/2019.   FINDINGS: Parenchymal Echotexture: Normal   Isthmus: 0.1 cm   Right lobe: 4.4 x 0.8 x 1.2 cm   Left lobe: 4.9 x 0.3 x 2.7 cm   _________________________________________________________   Estimated total number of nodules >/= 1 cm: 1   Number of spongiform  nodules >/=  2 cm not described below (TR1): 0   Number of mixed cystic and solid nodules >/= 1.5 cm not described below (TR2): 0   _________________________________________________________   Nodule # 1:   Location: LEFT; Mid   Maximum size: 4.1 cm; Other 2 dimensions: 3.1 x 2.1 cm, previously 3.7 x 2.0 x 1.6 cm   Composition: mixed cystic and solid (1)   Echogenicity: isoechoic (1)   Shape: not taller-than-wide (0)   Margins: ill-defined (0)   Echogenic foci: none (0)   ACR TI-RADS total points: 2.   ACR TI-RADS risk category: TR2 (2 points).   ACR TI-RADS recommendations:   This nodule does NOT meet TI-RADS criteria for biopsy or dedicated follow-up.   _________________________________________________________   No cervical adenopathy or abnormal fluid collection within the imaged neck.   IMPRESSION: Solitary, dominant 4.1 cm LEFT TR-2 thyroid nodule. Interval growth since 03/2021, previously 3.7 cm.   This nodule does NOT meet TI-RADS criteria for biopsy or dedicated follow-up.   The above is in keeping with the ACR TI-RADS recommendations - J Am Coll Radiol 2017;14:587-595.     Electronically Signed   By: Michaelle Birks M.D.   On: 07/16/2022 15:25   Uptake and Scan from 07/20/22 CLINICAL DATA:  Hyperthyroidism, suspect toxic nodule, abnormal TSH   EXAM: THYROID SCAN AND UPTAKE - 4 AND 24 HOURS   TECHNIQUE: Following oral administration of I-123 capsule, anterior planar imaging was acquired at 24 hours. Thyroid uptake was calculated with a thyroid probe at 4-6 hours and 24 hours.   RADIOPHARMACEUTICALS:  441.7 uCi I-123 sodium iodide p.o.   COMPARISON:  Thyroid ultrasound 07/16/2022   FINDINGS: Large hot nodule at inferior pole LEFT lobe corresponding to dominant mass identified on ultrasound.   Suppression of uptake in remaining thyroid tissue.   No definite additional nodules identified.   4 hour I-123 uptake = 14.4% (normal 5-20%)   24  hour I-123 uptake = 21.8% (normal 10-30%)   IMPRESSION: Toxic LEFT thyroid adenoma.     Electronically Signed   By: Lavonia Dana M.D.   On: 07/20/2022 11:39    Latest Reference Range & Units 10/23/21 11:54 01/18/22 14:38 02/23/22 09:56 04/28/22 11:10 07/03/22 17:59  TSH 0.350 - 4.500 uIU/mL 0.007 (L) <0.005 (L) <0.010 (L) <0.010 (L) 0.020 (L)  Triiodothyronine,Free,Serum 2.0 - 4.4 pg/mL  7.2 (H) 4.9 (H) 5.9 (H) 3.1  T4,Free(Direct) 0.61 - 1.12 ng/dL  1.85 (H) 0.66 0.89 0.59 (L)  T4,Free (Direct) 0.82 - 1.77 ng/dL 0.93      Thyroperoxidase Ab SerPl-aCnc 0 - 34 IU/mL  <9     Thyroglobulin Antibody 0.0 - 0.9 IU/mL  <1.0     (L): Data is abnormally low (H): Data is abnormally high  Assessment & Plan:   1. Thyroid nodule 2. Abnormal TSH 3. Hyperthyroidism 4. Toxic thyroid nodule  she is being seen at a kind request of Brita Romp, NP.  her history and most recent labs are reviewed, and  she was examined clinically. Subjective and objective findings are inconsistent with thyrotoxicosis from primary hyperthyroidism. However, the potential risks of untreated thyrotoxicosis and the need for definitive therapy have been discussed in detail with her, and she agrees to proceed with diagnostic workup and treatment plan.  Her antibody testing was negative, ruling out autoimmune thyroid dysfunction.   Her uptake and scan shows large left thyroid toxic adenoma, consistent with the 4.1 cm mass on thyroid ultrasound.  The preferred treatment for toxic adenoma is RAI ablation as it is least invasive than surgical removal and more effective than pharmacological treatment with Methimazole.  Trouble is timing.  Recommendations are to stop breastfeeding 6 weeks prior to RAI ablation and to not restart breastfeeding for the same child afterwards.  This is due to high quantities of RAI that gets excreted in breastmilk.  She questions if she took medications to help dry up her milk supply if that would  speed up the timeline for treatment.    I recommended reaching out to her OBGYN regarding meds to help dry up breast milk so we can proceed with RAI ablation ASAP as she does have significant symptoms.    She is seeing ENT to see if there is anything they can do to help with symptoms while we wait for appropriate time to ablate.  Her nodule did appear partially solid, partially cystic.  Will enter order for RAI ablation as soon as I hear back from her about breastmilk production ceasing.    -Patient is advised to maintain close follow up with Brita Romp, NP for primary care needs.    I spent  33  minutes in the care of the patient today including review of labs from Thyroid Function, CMP, and other relevant labs ; imaging/biopsy records (current and previous including abstractions from other facilities); face-to-face time discussing  her lab results and symptoms, medications doses, her options of short and long term treatment based on the latest standards of care / guidelines;   and documenting the encounter.  Sara Henson  participated in the discussions, expressed understanding, and voiced agreement with the above plans.  All questions were answered to her satisfaction. she is encouraged to contact clinic should she have any questions or concerns prior to her return visit.  Follow up plan: Return if symptoms worsen or fail to improve.   Thank you for involving me in the care of this pleasant patient, and I will continue to update you with her progress.    Rayetta Pigg, [facility] [address] [place], [place] [address] Phone: [phone] Fax: [phone]  07/21/2022, 11:21 AM

## 2022-07-21 NOTE — Progress Notes (Signed)
Results have been reviewed with the patient at her office visit on 07/21/2022.

## 2022-07-21 NOTE — Telephone Encounter (Signed)
Patient was seen in the office today , 07/21/2022. Sara Henson reviewed her results with her.

## 2022-07-21 NOTE — Telephone Encounter (Signed)
-----   Message from Brita Romp, NP sent at 07/20/2022 12:27 PM EDT ----- We need to get patient back in here for follow up appt to discuss results and plan moving forward.

## 2022-07-21 NOTE — Telephone Encounter (Signed)
Pt calling; needs to have Radioactive Iodine Ablation done on thyroid; is breastfeeding.  Is there a rx she can get to help her stop breastfeeding quickly?  [phone]

## 2022-07-22 ENCOUNTER — Encounter: Payer: Self-pay | Admitting: Obstetrics and Gynecology

## 2022-07-22 ENCOUNTER — Ambulatory Visit (INDEPENDENT_AMBULATORY_CARE_PROVIDER_SITE_OTHER): Payer: Medicaid Other | Admitting: Obstetrics and Gynecology

## 2022-07-22 DIAGNOSIS — E041 Nontoxic single thyroid nodule: Secondary | ICD-10-CM

## 2022-07-22 MED ORDER — BROMOCRIPTINE MESYLATE 2.5 MG PO TABS: 2.5000 mg | ORAL_TABLET | Freq: Two times a day (BID) | ORAL | 0 refills | Status: DC

## 2022-07-22 NOTE — Telephone Encounter (Signed)
Pt aware of medications and the schedulers will be calling her to schedule a televisit.

## 2022-07-22 NOTE — Progress Notes (Signed)
HPI:      Sara Henson is a 22 y.o. [phone] who LMP was No LMP recorded.  Subjective:   She presents today because she would like suppression of her milk production.  She has been breast-feeding and stopped on Monday.  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  She has been told by her endocrinologist that she can have no milk production whatsoever during the treatment-in fact they will not begin treatment until her milk supply is completely gone.  She is seeking suppression of this milk production as soon as possible so that she can proceed with her treatment.    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 She does not have any pertinent problems on file. She  has a past surgical history that includes Tonsillectomy; Tympanostomy tube placement; Cesarean section (N/A, 09/28/2019); and Colposcopy. Her family history includes Skin cancer in her father. She  reports that she has never smoked. She has quit using smokeless tobacco.  Her smokeless tobacco use included chew. She reports that she does not currently use alcohol. She reports that she does not use drugs. She has a current medication list which includes the following prescription(s): fluoxetine and bromocriptine. She has No Known Allergies.       Review of Systems:  Review of Systems  Constitutional: Denied constitutional symptoms, night sweats, recent illness, fatigue, fever, insomnia and weight loss.  Eyes: Denied eye symptoms, eye pain, photophobia, vision change and visual disturbance.  Ears/Nose/Throat/Neck: Denied ear, nose, throat or neck symptoms, hearing loss, nasal discharge, sinus congestion and sore throat.   Cardiovascular: Denied cardiovascular symptoms, arrhythmia, chest pain/pressure, edema, exercise intolerance, orthopnea and palpitations.  Respiratory: Denied pulmonary symptoms, asthma, pleuritic pain, productive sputum, cough, dyspnea and wheezing.  Gastrointestinal: Denied, gastro-esophageal reflux, melena, nausea and vomiting.  Genitourinary: Denied genitourinary symptoms including symptomatic vaginal discharge, pelvic relaxation issues, and urinary complaints.  Musculoskeletal: Denied musculoskeletal symptoms, stiffness, swelling, muscle weakness and myalgia.  Dermatologic: Denied dermatology symptoms, rash and scar.  Neurologic: Denied neurology symptoms, dizziness, headache, neck pain and syncope.  Psychiatric: Denied psychiatric symptoms, anxiety and depression.  Endocrine: Denied endocrine symptoms including hot flashes and night sweats.   Meds:   Current Outpatient Medications on File Prior to Visit  Medication Sig Dispense Refill   FLUoxetine (PROZAC) 20 MG capsule Take 80 mg by mouth every morning.     No current facility-administered medications on file prior to visit.      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 Lactating mother   2. Thyroid nodule     Patient to undergo radioactive iodine and cannot have any breastmilk production.   Plan:            1.  We  have discussed risks and benefits of use of bromocriptine for sensation of milk production.  Patient strongly desires this medication for cessation of production as she would like to proceed with her treatment for toxic thyroid nodule.  In this instance I think it is appropriate to help her with cessation of breast-feeding using bromocriptine.  Orders No orders of the defined types were placed in this encounter.    Meds ordered this encounter  Medications   bromocriptine (PARLODEL) 2.5 MG tablet    Sig: Take 1 tablet (2.5 mg total) by mouth 2 (two) times daily. 2 times per day for 14 days then 1 time per day for 7 days.    Dispense:  35 tablet    Refill:  0      F/U  No follow-ups on file. I spent 31 minutes involved in the care of this patient preparing to see the patient by obtaining and reviewing her medical history (including labs, imaging tests and prior procedures), documenting clinical information in the electronic health record (EHR), counseling and coordinating care plans, writing and sending prescriptions, ordering tests or procedures and in direct communicating with the patient and medical staff discussing pertinent items from her history and physical exam.  Finis Bud, M.D. 07/22/2022 1:51 PM

## 2022-07-22 NOTE — Telephone Encounter (Signed)
The patient is scheduled today at 1:15 with Dr. Amalia Hailey for lactation consult. The patient is aware it is an in person appointment.

## 2022-07-22 NOTE — Progress Notes (Signed)
Patient presents today to discuss methods to end breastfeeding. She has been diagnosed with a toxic nodule on her thyroid. She has been exclusively breastfeeding for the last 7 months. No other concerns.

## 2022-07-28 ENCOUNTER — Telehealth: Payer: Medicaid Other | Admitting: Obstetrics and Gynecology

## 2022-08-02 ENCOUNTER — Encounter: Payer: Self-pay | Admitting: Nurse Practitioner

## 2022-08-02 DIAGNOSIS — E0510 Thyrotoxicosis with toxic single thyroid nodule without thyrotoxic crisis or storm: Secondary | ICD-10-CM

## 2022-08-02 MED ORDER — METHIMAZOLE 5 MG PO TABS: 5.0000 mg | ORAL_TABLET | Freq: Two times a day (BID) | ORAL | 3 refills | Status: DC

## 2022-08-02 NOTE — Addendum Note (Signed)
Addended by: Brita Romp on: 08/02/2022 11:11 AM   Modules accepted: Orders

## 2022-08-03 ENCOUNTER — Ambulatory Visit: Payer: Medicaid Other | Admitting: Nurse Practitioner

## 2022-09-03 ENCOUNTER — Other Ambulatory Visit
Admission: RE | Admit: 2022-09-03 | Discharge: 2022-09-03 | Disposition: A | Discharge status: Home / Self Care | Payer: Medicaid Other | Attending: Nurse Practitioner | Admitting: Nurse Practitioner

## 2022-09-03 DIAGNOSIS — E0510 Thyrotoxicosis with toxic single thyroid nodule without thyrotoxic crisis or storm: Secondary | ICD-10-CM | POA: Insufficient documentation

## 2022-09-03 LAB — TSH: TSH: <0.01 u[IU]/mL — ABNORMAL LOW (ref 0.350–4.500)

## 2022-09-03 LAB — T4, FREE: Free T4: 0.74 ng/dL (ref 0.61–1.12)

## 2022-09-04 LAB — T3, FREE: T3, Free: 4.5 pg/mL — ABNORMAL HIGH (ref 2.0–4.4)

## 2022-09-06 ENCOUNTER — Encounter: Payer: Self-pay | Admitting: Nurse Practitioner

## 2022-09-07 NOTE — Progress Notes (Signed)
Noted  

## 2022-09-17 ENCOUNTER — Emergency Department
Admission: EM | Admit: 2022-09-17 | Discharge: 2022-09-17 | Disposition: A | Discharge status: Home / Self Care | Payer: Medicaid Other | Attending: Emergency Medicine | Admitting: Emergency Medicine

## 2022-09-17 ENCOUNTER — Other Ambulatory Visit: Payer: Self-pay

## 2022-09-17 DIAGNOSIS — E860 Dehydration: Secondary | ICD-10-CM | POA: Insufficient documentation

## 2022-09-17 DIAGNOSIS — R197 Diarrhea, unspecified: Secondary | ICD-10-CM | POA: Insufficient documentation

## 2022-09-17 DIAGNOSIS — J45909 Unspecified asthma, uncomplicated: Secondary | ICD-10-CM | POA: Diagnosis not present

## 2022-09-17 DIAGNOSIS — R109 Unspecified abdominal pain: Secondary | ICD-10-CM | POA: Diagnosis not present

## 2022-09-17 DIAGNOSIS — R531 Weakness: Secondary | ICD-10-CM

## 2022-09-17 DIAGNOSIS — R110 Nausea: Secondary | ICD-10-CM | POA: Diagnosis not present

## 2022-09-17 LAB — COMPREHENSIVE METABOLIC PANEL
ALT: 33 U/L (ref 0–44)
AST: 23 U/L (ref 15–41)
Albumin: 3.8 g/dL (ref 3.5–5.0)
Alkaline Phosphatase: 66 U/L (ref 38–126)
Anion gap: 7 (ref 5–15)
BUN: 13 mg/dL (ref 6–20)
CO2: 23 mmol/L (ref 22–32)
Calcium: 8.6 mg/dL — ABNORMAL LOW (ref 8.9–10.3)
Chloride: 104 mmol/L (ref 98–111)
Creatinine, Ser: 0.71 mg/dL (ref 0.44–1.00)
GFR, Estimated: >60 mL/min (ref >60)
Glucose, Bld: 91 mg/dL (ref 70–99)
Potassium: 3.8 mmol/L (ref 3.5–5.1)
Sodium: 134 mmol/L — ABNORMAL LOW (ref 135–145)
Total Bilirubin: 0.5 mg/dL (ref 0.3–1.2)
Total Protein: 6.5 g/dL (ref 6.5–8.1)

## 2022-09-17 LAB — CBC
HCT: 41.6 % (ref 36.0–46.0)
Hemoglobin: 13.6 g/dL (ref 12.0–15.0)
MCH: 28.5 pg (ref 26.0–34.0)
MCHC: 32.7 g/dL (ref 30.0–36.0)
MCV: 87 fL (ref 80.0–100.0)
Platelets: 217 10*3/uL (ref 150–400)
RBC: 4.78 MIL/uL (ref 3.87–5.11)
RDW: 12.3 % (ref 11.5–15.5)
WBC: 8.7 10*3/uL (ref 4.0–10.5)
nRBC: 0 % (ref 0.0–0.2)

## 2022-09-17 LAB — URINALYSIS, ROUTINE W REFLEX MICROSCOPIC
Bilirubin Urine: NEGATIVE
Glucose, UA: 150 mg/dL — AB
Hgb urine dipstick: NEGATIVE
Ketones, ur: NEGATIVE mg/dL
Leukocytes,Ua: NEGATIVE
Nitrite: NEGATIVE
Protein, ur: NEGATIVE mg/dL
Specific Gravity, Urine: 1.019 (ref 1.005–1.030)
pH: 6 (ref 5.0–8.0)

## 2022-09-17 LAB — LIPASE, BLOOD: Lipase: 29 U/L (ref 11–51)

## 2022-09-17 LAB — POC URINE PREG, ED: Preg Test, Ur: NEGATIVE

## 2022-09-17 MED ORDER — KETOROLAC TROMETHAMINE 30 MG/ML IJ SOLN
30.0000 mg | Freq: Once | INTRAMUSCULAR | Status: AC
  Administered 2022-09-17: 30 mg via INTRAVENOUS
  Filled 2022-09-17: qty 1

## 2022-09-17 MED ORDER — ONDANSETRON HCL 4 MG/2ML IJ SOLN
4.0000 mg | Freq: Once | INTRAMUSCULAR | Status: AC
  Administered 2022-09-17: 4 mg via INTRAVENOUS
  Filled 2022-09-17: qty 2

## 2022-09-17 MED ORDER — SODIUM CHLORIDE 0.9 % IV BOLUS
1000.0000 mL | Freq: Once | INTRAVENOUS | Status: AC
  Administered 2022-09-17: 1000 mL via INTRAVENOUS

## 2022-09-17 MED ORDER — ONDANSETRON 4 MG PO TBDP: 4.0000 mg | ORAL_TABLET | Freq: Three times a day (TID) | ORAL | 0 refills | Status: DC | PRN

## 2022-09-17 NOTE — ED Provider Notes (Signed)
[facility] Provider Note    Event Date/Time   First MD Initiated Contact with Patient 09/17/22 1104     (approximate)  History   Chief Complaint: UTI and Back Pain  HPI  ELEONORA PERUZZI is a 22 y.o. female with a past medical history of asthma, gastric reflux, presents to the emergency department for abdominal discomfort nausea and diarrhea.  According to the patient over the past 2 days or so she has been feeling more fatigued than typical.  Patient states she has been experiencing allergies and has used Benadryl recently which she blamed her fatigue on.  However she states over the past 24 hours she has now developed diarrhea with some abdominal cramping and nausea.  Patient states she has had similar symptoms in the past with urinary tract infections although denies any dysuria or hematuria.  No vaginal bleeding or discharge.  Last menstrual period was approximately 2 weeks ago.  Physical Exam   Triage Vital Signs: ED Triage Vitals  Enc Vitals Group     BP 09/17/22 0947 116/73     Pulse Rate 09/17/22 0947 (!) 109     Resp 09/17/22 0947 16     Temp 09/17/22 0947 99.3 F (37.4 C)     Temp Source 09/17/22 0947 Oral     SpO2 09/17/22 0947 96 %     Weight 09/17/22 0947 200 lb (90.7 kg)     Height 09/17/22 0947 5\' 8"  (1.727 m)     Head Circumference --      Peak Flow --      Pain Score 09/17/22 0945 8     Pain Loc --      Pain Edu? --      Excl. in GC? --     Most recent vital signs: Vitals:   09/17/22 0947  BP: 116/73  Pulse: (!) 109  Resp: 16  Temp: 99.3 F (37.4 C)  SpO2: 96%    General: Awake, no distress.  CV:  Good peripheral perfusion.  Regular rate and rhythm  Resp:  Normal effort.  Equal breath sounds bilaterally.  Abd:  No distention.  Soft, nontender.  No rebound or guarding.  Benign abdomen.  No CVA tenderness.  ED Results / Procedures / Treatments   MEDICATIONS ORDERED IN ED: Medications  sodium chloride 0.9 % bolus 1,000 mL  (has no administration in time range)  ketorolac (TORADOL) 30 MG/ML injection 30 mg (has no administration in time range)  ondansetron (ZOFRAN) injection 4 mg (has no administration in time range)     IMPRESSION / MDM / ASSESSMENT AND PLAN / ED COURSE  I reviewed the triage vital signs and the nursing notes.  Patient's presentation is most consistent with acute presentation with potential threat to life or bodily function.  Patient presents emergency department for generalized fatigue nausea diarrhea abdominal cramping.  No known fever at home 99.3 in the emergency department.  Mild tachycardia.  Patient states she has not urinated this morning and is concerned she could be dehydrated.  Patient's lab work is overall reassuring with a normal CBC with a normal white blood cell count, reassuring chemistry with anion gap of 7 and normal renal function, lipase is normal as well.  We will IV hydrate treat with Toradol and Zofran.  Will obtain a urinalysis and pregnancy test as a precaution.  Patient agreeable to plan of care.  Patient's lab work has resulted showing a normal chemistry, reassuring CBC, reassuring urinalysis and  normal lipase.  Patient's pregnancy test is negative.  Patient states she is feeling much better after medications.  Will discharge with Zofran discussed continued supportive care at home including fluids rest Tylenol or ibuprofen for discomfort.  Discussed return precautions for any fever or development of focal abdominal pain.  Patient agreeable.  FINAL CLINICAL IMPRESSION(S) / ED DIAGNOSES   Nausea Diarrhea Dehydration   Note:  This document was prepared using Dragon voice recognition software and may include unintentional dictation errors.   Minna Antis, MD 09/17/22 1345

## 2022-09-17 NOTE — ED Notes (Signed)
Patient complains of back pain, and having a hard time urinating.

## 2022-09-17 NOTE — ED Triage Notes (Signed)
Pt here for a possible kidney infection, pt states HX of same and sates bilateral back pain and nausea and diarrhea. Pt is A&Ox4.

## 2022-09-28 ENCOUNTER — Other Ambulatory Visit
Admission: RE | Admit: 2022-09-28 | Discharge: 2022-09-28 | Disposition: A | Discharge status: Home / Self Care | Payer: Medicaid Other | Source: Ambulatory Visit | Attending: Endocrinology | Admitting: Endocrinology

## 2022-09-28 DIAGNOSIS — E0510 Thyrotoxicosis with toxic single thyroid nodule without thyrotoxic crisis or storm: Secondary | ICD-10-CM | POA: Insufficient documentation

## 2022-09-28 LAB — TSH: TSH: <0.01 u[IU]/mL — ABNORMAL LOW (ref 0.350–4.500)

## 2022-09-28 LAB — T4, FREE: Free T4: 0.57 ng/dL — ABNORMAL LOW (ref 0.61–1.12)

## 2022-09-29 LAB — T3, FREE: T3, Free: 4.4 pg/mL (ref 2.0–4.4)

## 2022-10-02 HISTORY — PX: OTHER SURGICAL HISTORY: SHX169

## 2022-11-05 IMAGING — US US OB COMP LESS 14 WK
1 series · 14 of 28 positions shown · non-contrast
Comparison: None

CLINICAL DATA: First trimester pregnancy, dating, LMP 03/25/2021

EXAM:
OBSTETRIC <14 WK ULTRASOUND
TECHNIQUE: Transabdominal ultrasound was performed for evaluation of the
gestation as well as the maternal uterus and adnexal regions.

[Series 1: us ob comp less 14 wks · 14 of 55 slices shown]
[im 3/55]
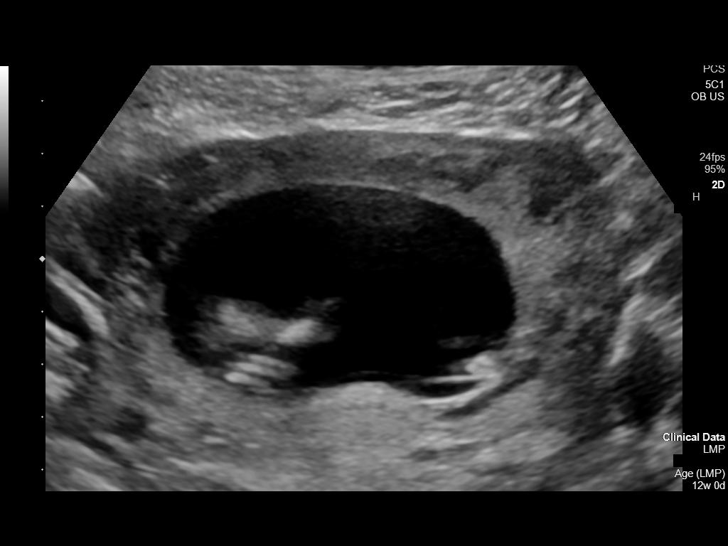
[im 7/55]
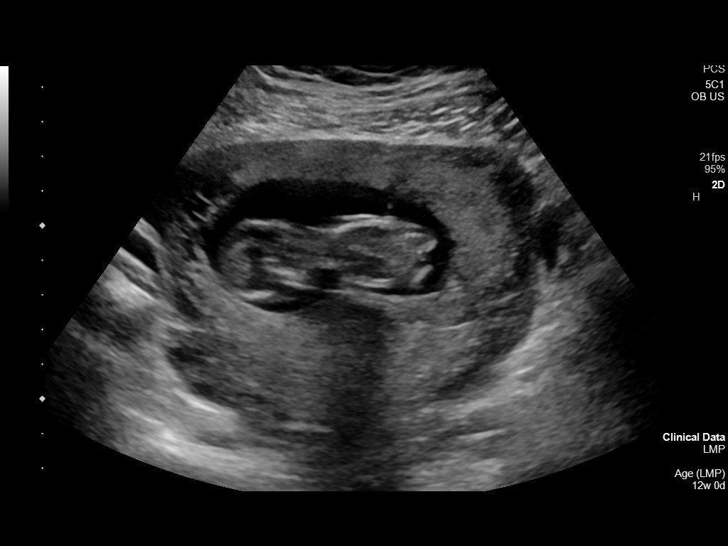
[im 11/55]
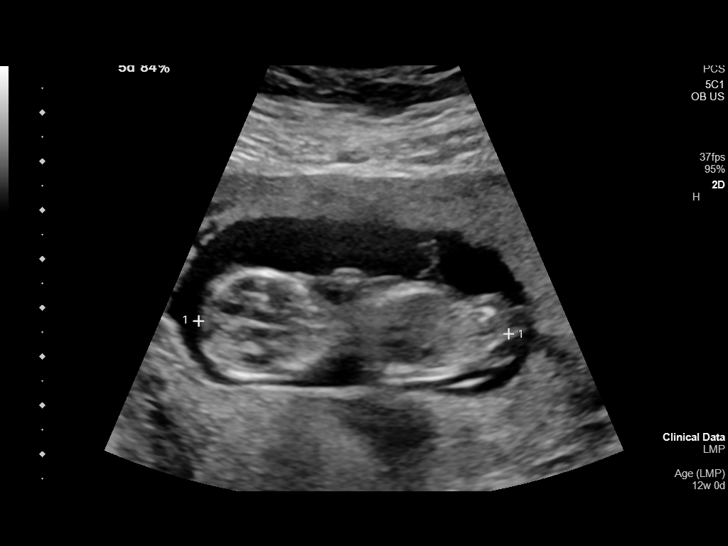
[im 15/55]
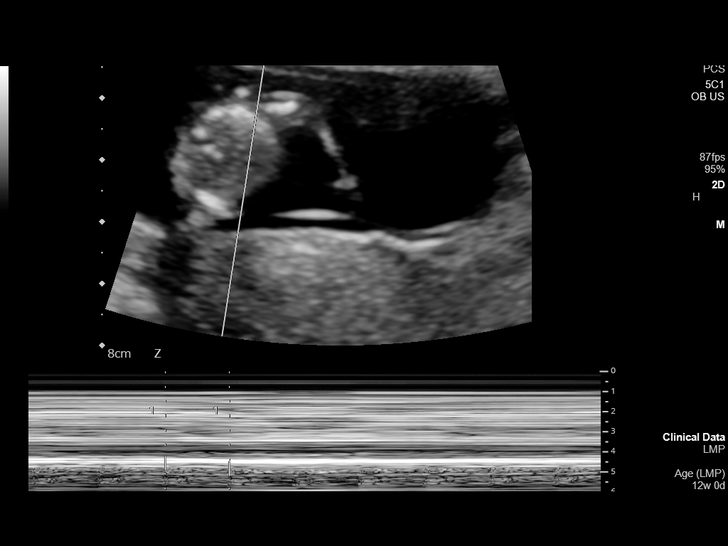
[im 19/55]
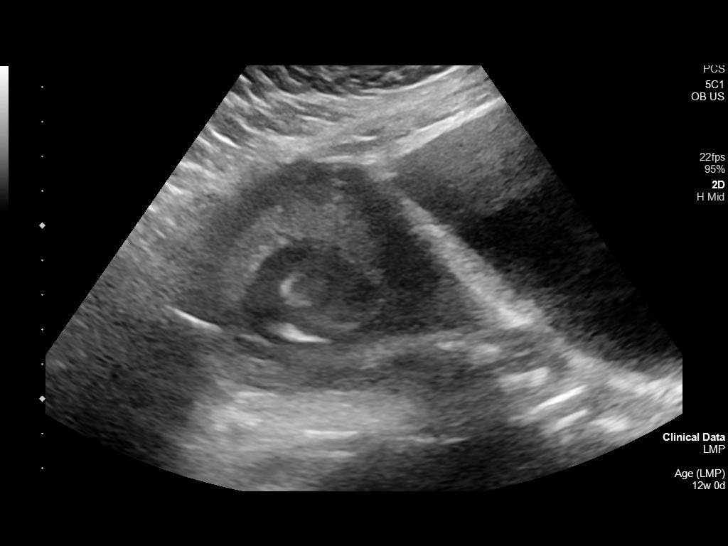
[im 23/55]
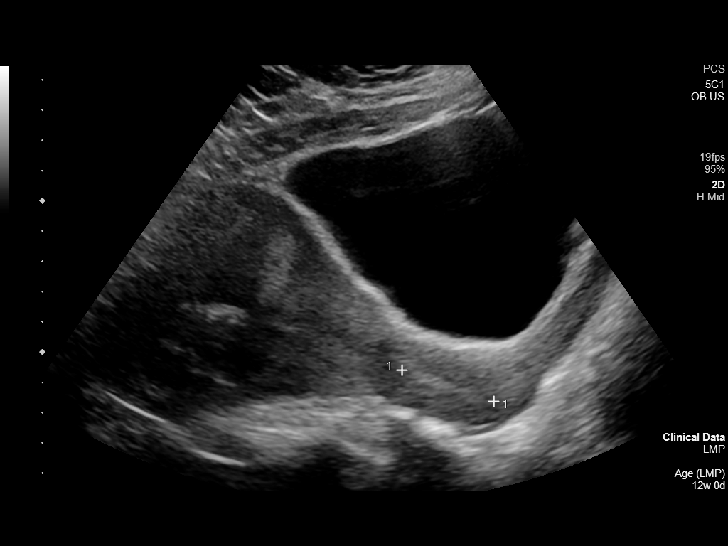
[im 27/55]
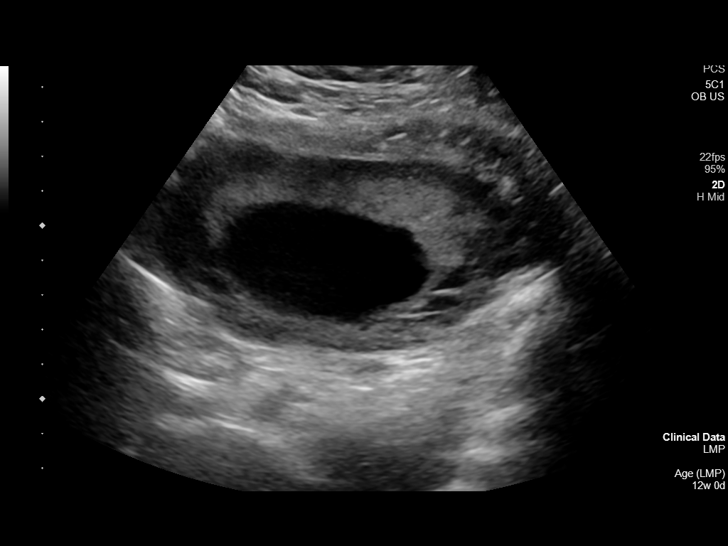
[im 31/55]
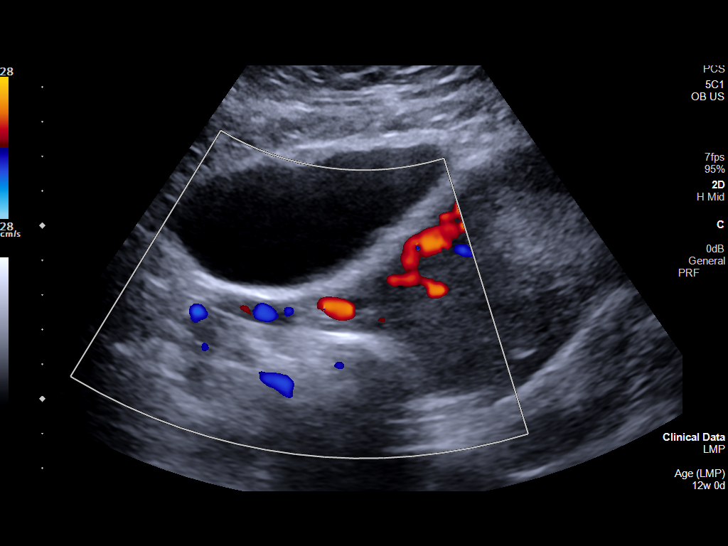
[im 35/55]
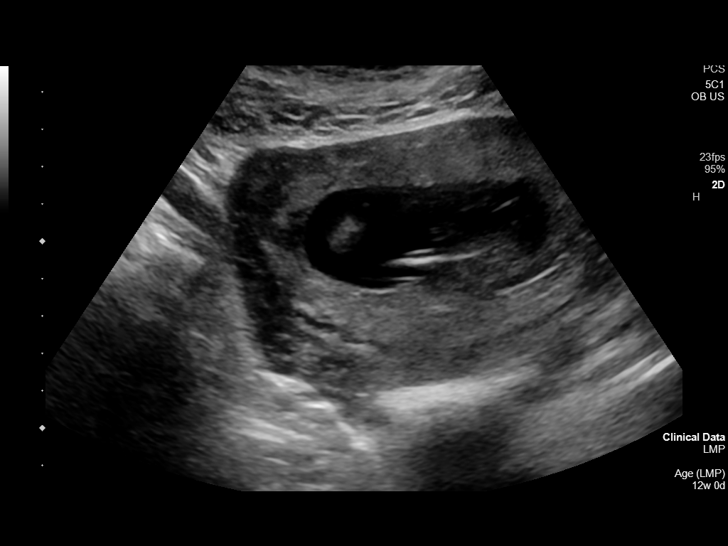
[im 39/55]
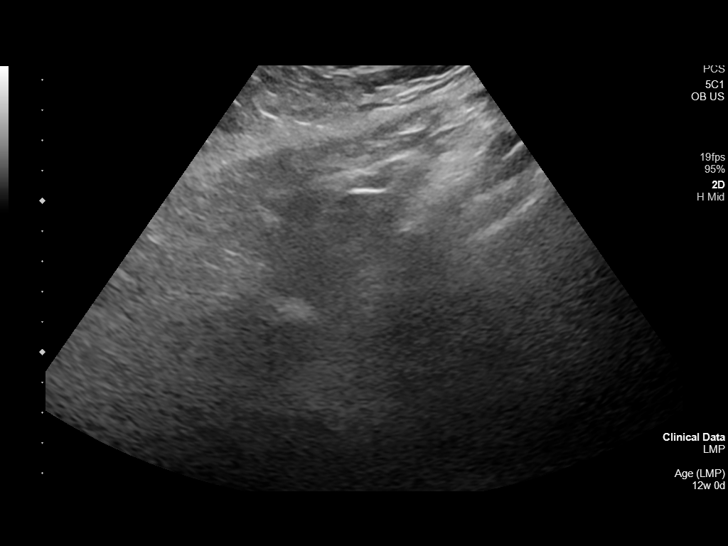
[im 43/55]
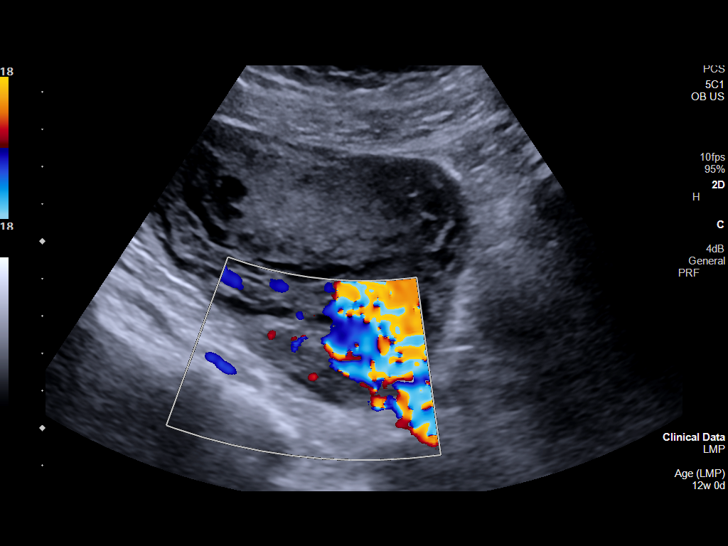
[im 47/55]
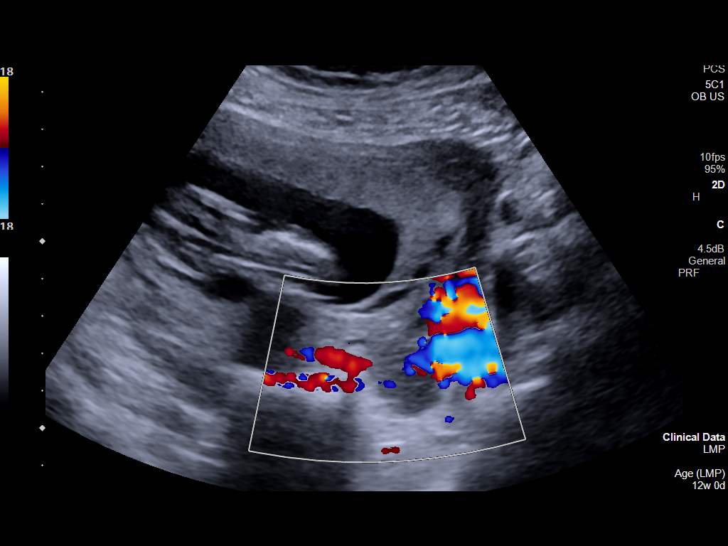
[im 51/55]
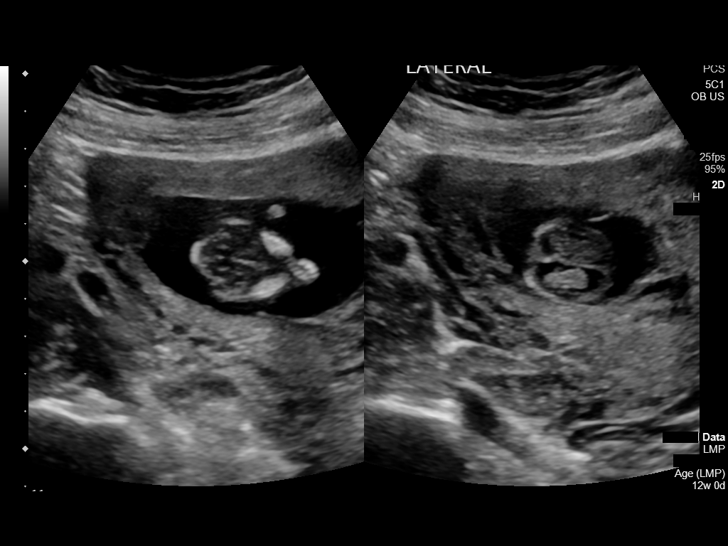
[im 55/55]
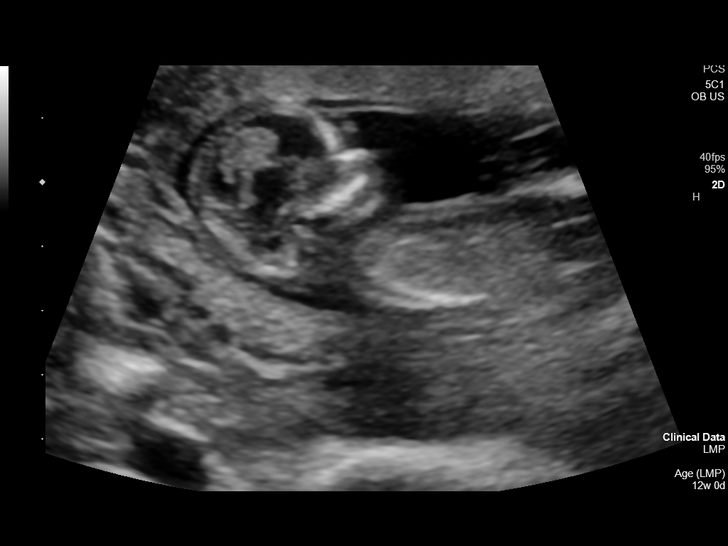

[14 of 28 positions shown; findings below may reference images not displayed]

FINDINGS: Intrauterine gestational sac: Present, single

Yolk sac:  Not identified

Embryo:  Present

Cardiac Activity: Present

Heart Rate: 160 bpm

CRL:   65.2 mm   12 w 6 d                  US EDC: 12/24/2021

Subchorionic hemorrhage:  None visualized.

Maternal uterus/adnexae:

Remainder of uterus unremarkable.

RIGHT ovary normal size and morphology 3.8 x 1.7 x 1.5 cm.

LEFT ovary normal size and morphology 3.7 x 2.3 x 2.3 cm.

No free pelvic fluid or adnexal masses.
IMPRESSION: Single live intrauterine gestation at 12 weeks 6 days EGA.

No acute abnormalities.

## 2022-12-24 ENCOUNTER — Other Ambulatory Visit: Admission: RE | Admit: 2022-12-24 | Payer: Medicaid Other | Source: Home / Self Care

## 2022-12-24 DIAGNOSIS — E041 Nontoxic single thyroid nodule: Secondary | ICD-10-CM | POA: Insufficient documentation

## 2022-12-24 LAB — TSH: TSH: 5.993 u[IU]/mL — ABNORMAL HIGH (ref 0.350–4.500)

## 2022-12-24 LAB — T4, FREE: Free T4: 0.51 ng/dL — ABNORMAL LOW (ref 0.61–1.12)

## 2022-12-25 LAB — T3, FREE: T3, Free: 3.3 pg/mL (ref 2.0–4.4)

## 2023-01-19 IMAGING — US US OB FOLLOW-UP
2 series · 13 of 28 positions shown · non-contrast
Comparison: none

CLINICAL DATA: Incomplete anatomic survey on prior study, in
adequate visualization of the fetal spine

EXAM:
OBSTETRIC 14+ WK ULTRASOUND FOLLOW-UP

[Series 1: us ob follow up · 6 of 36 slices shown (1 of 2)]
[im 4/36]
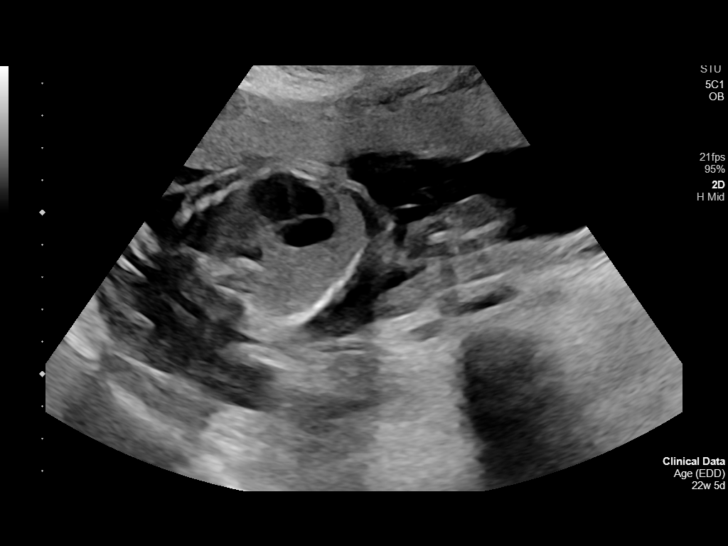
[im 10/36]
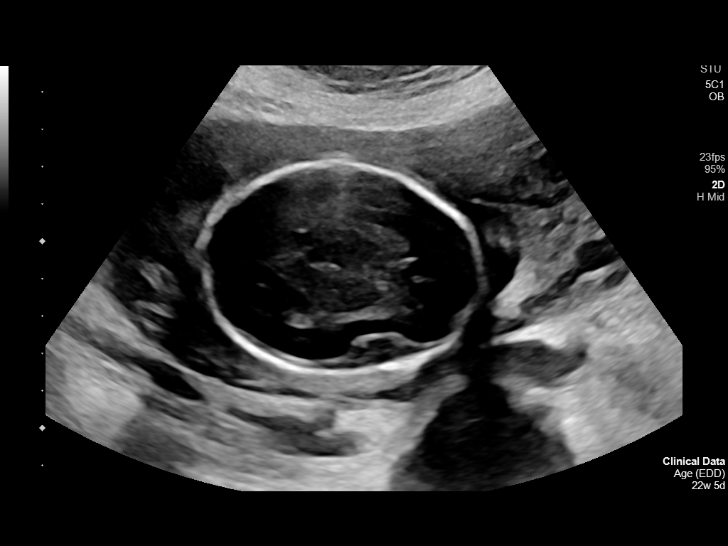
[im 16/36]
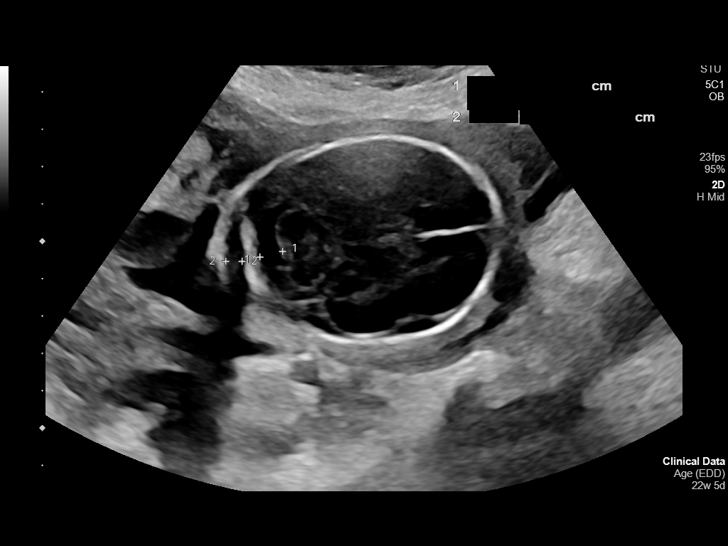
[im 23/36]
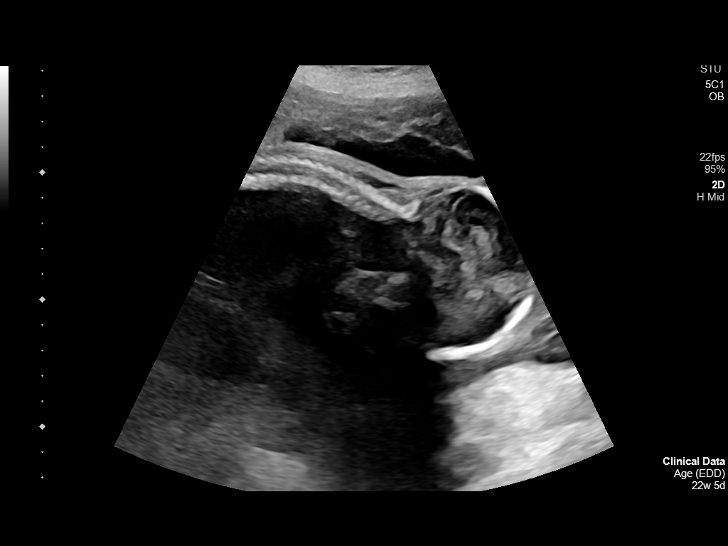
[im 29/36]
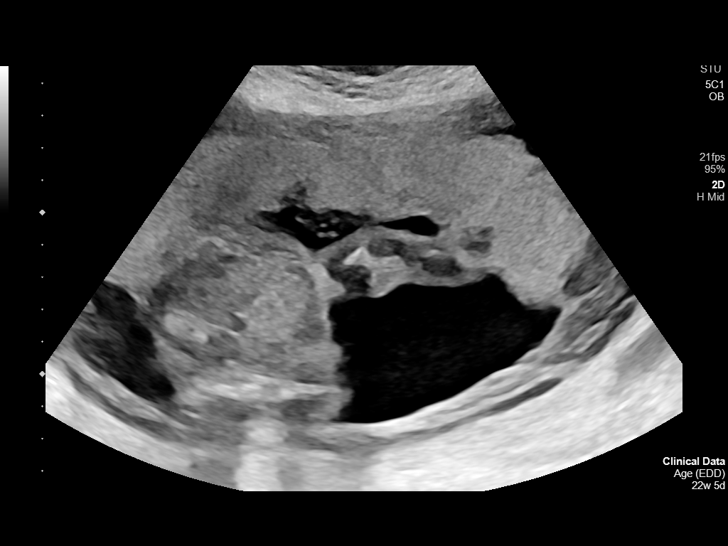
[im 36/36]
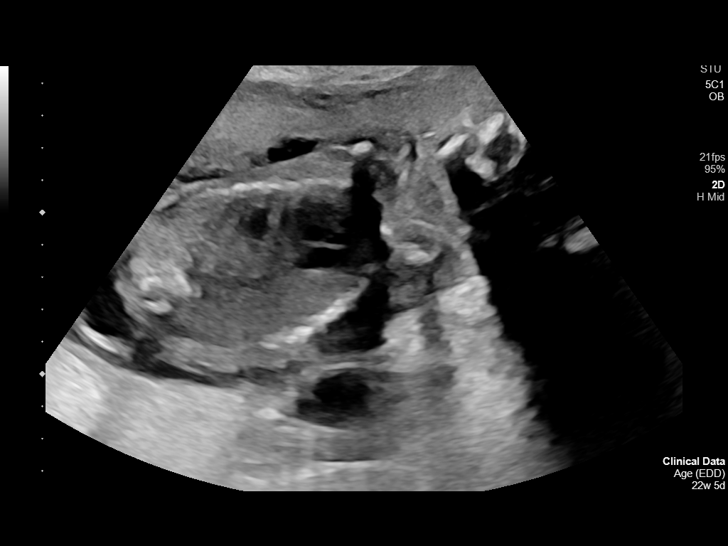

[Series 1001: us ob follow up · 7 of 47 slices shown (2 of 2)]
[im 7/47]
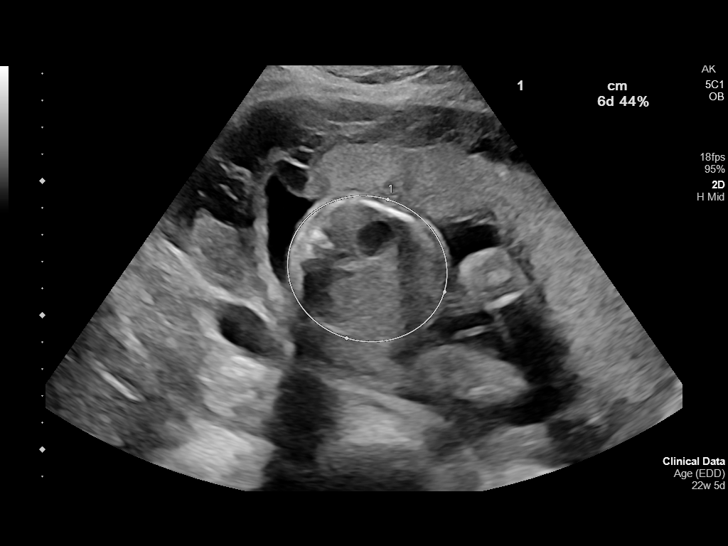
[im 13/47]
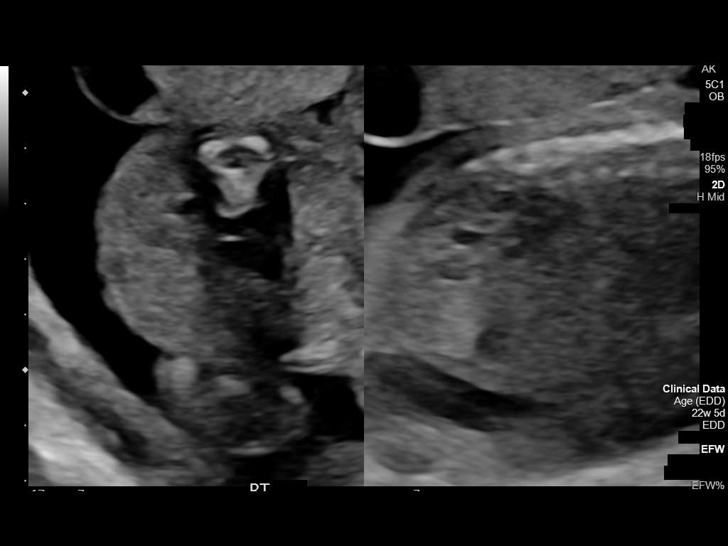
[im 19/47]
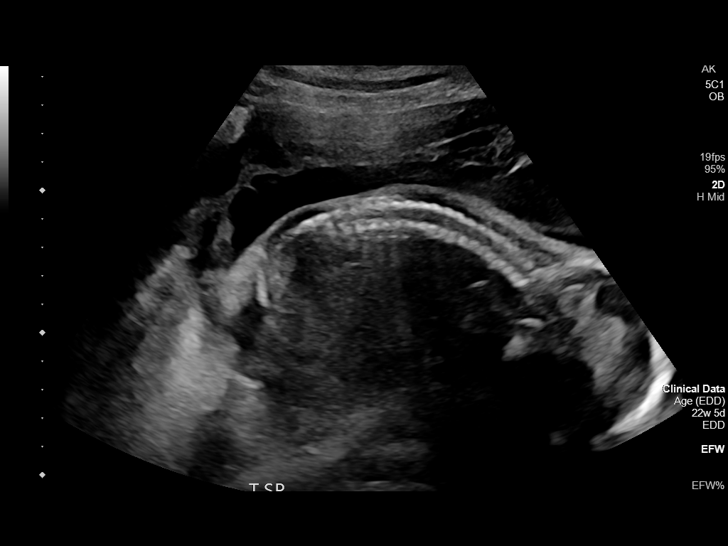
[im 25/47]
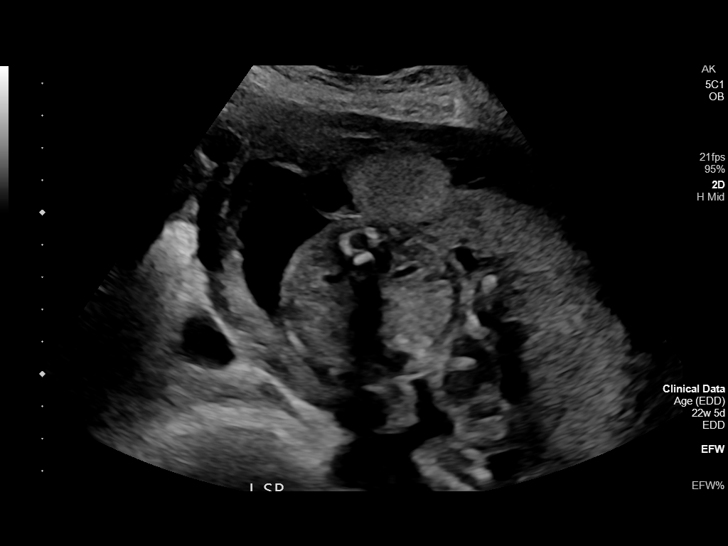
[im 31/47]
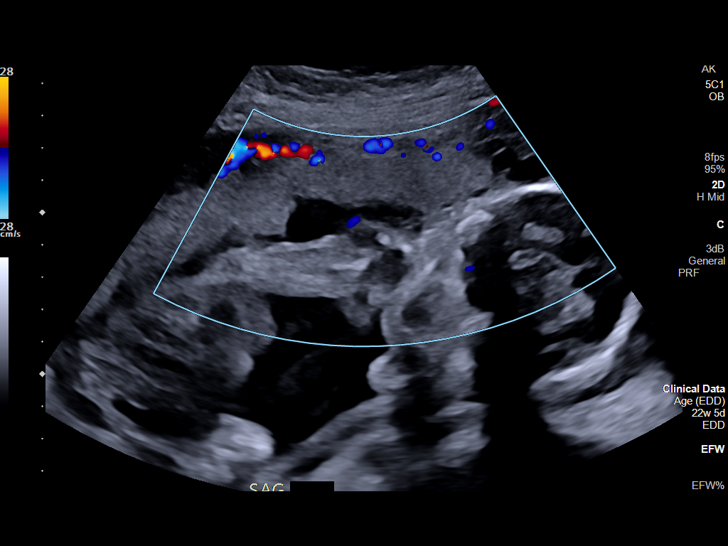
[im 37/47]
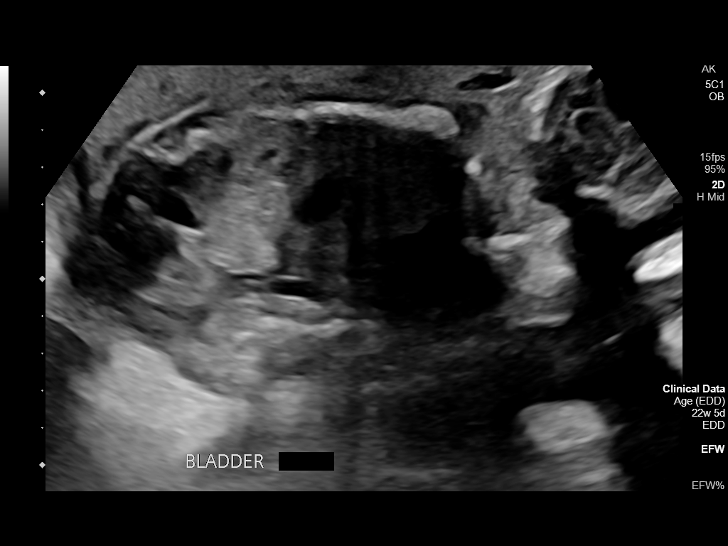
[im 43/47]
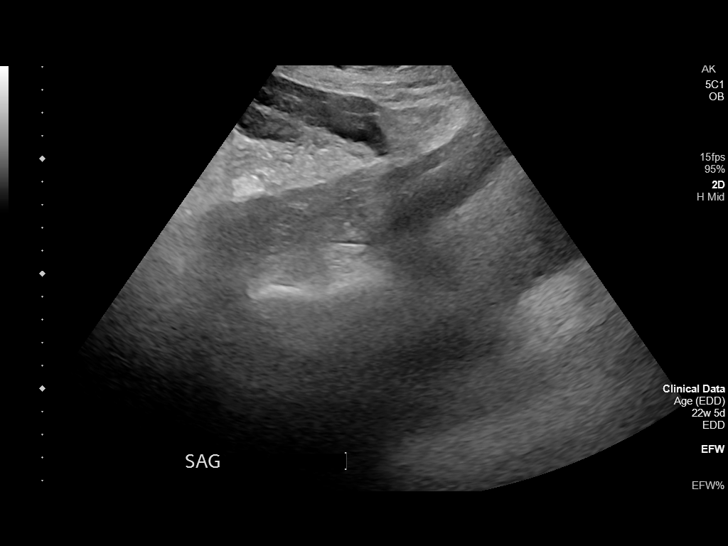

[13 of 28 positions shown; findings below may reference images not displayed]

FINDINGS: Number of Fetuses: 1

Heart Rate:  144 bpm

Movement: Yes

Presentation: Cephalic

Previa: No

Placental Location: Anterior

Amniotic Fluid (Subjective): Normal

Amniotic Fluid (Objective):

Vertical pocket 4.1cm

FETAL BIOMETRY

BPD:  5.5cm 22w 5d

HC:    20.5cm 22w 4d

AC:    18.4cm 23w 2d

FL:    4.1cm 23w 1d

Current Mean GA: 23w 0d US EDC: 12/28/2021

Assigned GA: 22w 5d Assigned EDC: 12/30/2021

FETAL ANATOMY

Lateral Ventricles: Appears normal

Thalami/CSP: Appears normal

Posterior Fossa: Appears normal

Nuchal Region: Previously seen

Upper Lip: Previously seen

Spine: Appears normal

4 Chamber Heart on Left: Appears normal

LVOT: Previously seen

RVOT: Previously seen

Stomach on Left: Appears normal

3 Vessel Cord: Previously seen

Cord Insertion site: Previously seen

Kidneys: Appears normal

Bladder: Appears normal

Extremities: Previously seen

Sex: Previously Seen

Technical Limitations: None

Maternal Findings:

Cervix:  Closed, 4.9 cm
IMPRESSION: 1. Single live intrauterine pregnancy as above, estimated
gestational age 23 weeks and 0 days.
2. Normal appearance of the fetal spine. This completes the anatomic
survey begun previously. No fetal anomalies identified.

## 2023-02-02 ENCOUNTER — Other Ambulatory Visit (HOSPITAL_COMMUNITY)
Admission: RE | Admit: 2023-02-02 | Discharge: 2023-02-02 | Disposition: A | Discharge status: Home / Self Care | Payer: Medicaid Other | Source: Ambulatory Visit | Attending: Obstetrics | Admitting: Obstetrics

## 2023-02-02 ENCOUNTER — Encounter: Payer: Self-pay | Admitting: Obstetrics

## 2023-02-02 ENCOUNTER — Ambulatory Visit (INDEPENDENT_AMBULATORY_CARE_PROVIDER_SITE_OTHER): Payer: Medicaid Other | Admitting: Obstetrics

## 2023-02-02 VITALS — BP 100/59 | HR 64 | Ht 68.0 in | Wt 201.0 lb

## 2023-02-02 DIAGNOSIS — Z113 Encounter for screening for infections with a predominantly sexual mode of transmission: Secondary | ICD-10-CM

## 2023-02-02 DIAGNOSIS — Z30017 Encounter for initial prescription of implantable subdermal contraceptive: Secondary | ICD-10-CM | POA: Diagnosis not present

## 2023-02-02 DIAGNOSIS — Z3202 Encounter for pregnancy test, result negative: Secondary | ICD-10-CM | POA: Diagnosis not present

## 2023-02-02 DIAGNOSIS — Z30430 Encounter for insertion of intrauterine contraceptive device: Secondary | ICD-10-CM

## 2023-02-02 LAB — POCT URINE PREGNANCY: Preg Test, Ur: NEGATIVE

## 2023-02-02 MED ORDER — LEVONORGESTREL 20 MCG/DAY IU IUD
1.0000 | INTRAUTERINE_SYSTEM | Freq: Once | INTRAUTERINE | Status: AC
Start: 2023-02-02 — End: 2023-02-02
  Administered 2023-02-02: 1 via INTRAUTERINE

## 2023-02-02 NOTE — Progress Notes (Signed)
Sara Henson presents today for IUD insertion. She reports an LMP of 01/22/2023. Her pregnancy test has been negative. She I presently going through separation and likely divorce, but is managing this well. Sara Henson she thought to get a Paragard IUD, but after reviewing the bleeding patterns of the copper IUD, now desires Mirena placement.She is a [place] 3 Para 2, and is uncertain whether she will have any other children. The IUD has been discussed with her in the past, and provides informed consent today. She has no contraindication s to the IUD.  ):  BP (!) 100/59   Pulse 64   Ht 5\' 8"  (1.727 m)   Wt 201 lb (91.2 kg)   LMP 01/22/2023 (Approximate)   Breastfeeding No   BMI 30.56 kg/m   Review of Systems  Constitutional: Negative.   HENT: Negative.    Eyes: Negative.   Respiratory: Negative.    Cardiovascular: Negative.   Gastrointestinal: Negative.   Skin: Negative.   Neurological: Negative.   Endo/Heme/Allergies: Negative.   Psychiatric/Behavioral: Negative.    Physical Exam Constitutional:      Appearance: Normal appearance.  Cardiovascular:     Rate and Rhythm: Normal rate and regular rhythm.     Pulses: Normal pulses.     Heart sounds: Normal heart sounds.  Pulmonary:     Effort: Pulmonary effort is normal.     Breath sounds: Normal breath sounds.  Abdominal:     Palpations: Abdomen is soft.  Genitourinary:    General: Normal vulva.     Rectum: Normal.     Comments: No external irritation or lesions. Normal vaginal mucosa, minimal discharge. Uterus is anteverted, non enlarged. Aptima swab for STI testing is retrieved today. Musculoskeletal:     Cervical back: Normal range of motion and neck supple.  Skin:    General: Skin is warm and dry.  Neurological:     General: No focal deficit present.     Mental Status: She is alert and oriented to person, place, and time.  Psychiatric:        Mood and Affect: Mood normal.        Behavior: Behavior normal.    A: IUD  insertion   IUD Insertion Procedure Note Patient identified, informed consent performed, consent signed.   Discussed risks of irregular bleeding, cramping, infection, malpositioning, expulsion or uterine perforation of the IUD (1:1000 placements)  which may require further procedure such as laparoscopy.  IUD while effective at preventing pregnancy do not prevent transmission of sexually transmitted diseases and use of barrier methods for this purpose was discussed. Time out was performed.  Urine pregnancy test negative.  Speculum placed in the vagina.  Cervix visualized.  Cleaned with Betadine x 2.  Grasped anteriorly with a single tooth tenaculum.  Uterus sounded to 9 cm. IUD placed per manufacturer's recommendations.  Strings trimmed to 3 cm. Tenaculum was removed, good hemostasis noted.  Patient tolerated procedure well.   Patient was given post-procedure instructions.  She was advised to have backup contraception for one week.  Patient was also asked to check IUD strings periodically and follow up in 4 weeks for IUD check.   Sara Henson, CNM  02/02/2023 5:36 PM

## 2023-02-07 LAB — CERVICOVAGINAL ANCILLARY ONLY
Bacterial Vaginitis (gardnerella): POSITIVE — AB
Candida Glabrata: NEGATIVE
Candida Vaginitis: POSITIVE — AB
Chlamydia: NEGATIVE
Comment: NEGATIVE
Comment: NEGATIVE
Comment: NEGATIVE
Comment: NEGATIVE
Comment: NEGATIVE
Comment: NORMAL
Neisseria Gonorrhea: NEGATIVE
Trichomonas: NEGATIVE

## 2023-02-09 ENCOUNTER — Other Ambulatory Visit: Payer: Self-pay | Admitting: Obstetrics

## 2023-02-09 ENCOUNTER — Encounter: Payer: Self-pay | Admitting: Obstetrics

## 2023-02-09 DIAGNOSIS — N760 Acute vaginitis: Secondary | ICD-10-CM

## 2023-02-09 DIAGNOSIS — B3731 Acute candidiasis of vulva and vagina: Secondary | ICD-10-CM

## 2023-02-09 MED ORDER — METRONIDAZOLE 500 MG PO TABS
500.0000 mg | ORAL_TABLET | Freq: Two times a day (BID) | ORAL | 0 refills | Status: AC
Start: 2023-02-09 — End: 2023-02-16

## 2023-02-09 MED ORDER — FLUCONAZOLE 150 MG PO TABS
150.0000 mg | ORAL_TABLET | Freq: Once | ORAL | 0 refills | Status: AC
Start: 2023-02-09 — End: 2023-02-09

## 2023-02-09 NOTE — Progress Notes (Signed)
Her Aptima swab shows both BV and yeast vaginitis. I have sent in Rxs for both Flagyl and diflucan and notified her via MyChart.  Mirna Mires, CNM  02/09/2023 4:45 PM

## 2023-02-28 ENCOUNTER — Ambulatory Visit: Payer: Medicaid Other | Admitting: Obstetrics

## 2023-03-07 ENCOUNTER — Ambulatory Visit: Payer: Medicaid Other | Admitting: Obstetrics

## 2023-03-07 NOTE — Progress Notes (Deleted)
    GYNECOLOGY OFFICE ENCOUNTER NOTE  History:  22 y.o. [id number] here today for today for IUD string check; Mirena  IUD was placed  02/02/2023. No complaints about the IUD, no concerning side effects.  The following portions of the patient's history were reviewed and updated as appropriate: allergies, current medications, past family history, past medical history, past social history, past surgical history and problem list. Last pap smear on 02/12/2022 was normal, negative HRHPV.  Review of Systems:  Pertinent items are noted in HPI.  Objective:  Physical Exam not currently breastfeeding. CONSTITUTIONAL: Well-developed, well-nourished female in no acute distress.  NEUROLOGIC: Alert and oriented to person, place, and time. Normal reflexes, muscle tone coordination.  ABDOMEN: Soft, no distention noted.   PELVIC: Normal appearing external genitalia; normal appearing vaginal mucosa and cervix.  IUD strings visualized, about *** cm in length outside cervix. Done in the presence of a chaperone.  EXTREMITIES: Non-tender, no edema or cyanosis  Assessment & Plan:  Patient to keep IUD in place for up to 8 years; can come in for removal if she desires pregnancy earlier or for any concerning side effects.     Paula Compton, CNM [facility]

## 2023-03-16 ENCOUNTER — Ambulatory Visit (INDEPENDENT_AMBULATORY_CARE_PROVIDER_SITE_OTHER): Payer: Medicaid Other | Admitting: Obstetrics

## 2023-03-16 ENCOUNTER — Encounter: Payer: Self-pay | Admitting: Obstetrics

## 2023-03-16 VITALS — BP 111/69 | HR 78 | Ht 68.0 in | Wt 202.0 lb

## 2023-03-16 DIAGNOSIS — Z30431 Encounter for routine checking of intrauterine contraceptive device: Secondary | ICD-10-CM | POA: Diagnosis not present

## 2023-03-16 NOTE — Progress Notes (Signed)
   IUD String Check  Subjctive: [name] presents for IUD string check.  She had a Mirena placed 4 weeks ago.  Since placement of her IUD she had some  vaginal bleeding.  She denies cramping or discomfort.  She has not had intercourse since placement.  She has not checked the strings.  She denies any fever, chills, nausea, vomiting, or other complaints.    Objective: BP 111/69   Pulse 78   Ht 5\' 8"  (1.727 m)   Wt 202 lb (91.6 kg)   LMP 02/06/2023 (Approximate)   Breastfeeding No   BMI 30.71 kg/m  Physical Exam Constitutional:      Appearance: Normal appearance.  Genitourinary:     Vulva and rectum normal.     Genitourinary Comments: Spec exam- no vaginal bleeding visualized in the vault. IUD string are visible.  Cardiovascular:     Rate and Rhythm: Normal rate and regular rhythm.  Pulmonary:     Effort: Pulmonary effort is normal.     Breath sounds: Normal breath sounds.  Abdominal:     Palpations: Abdomen is soft.  Musculoskeletal:        General: Normal range of motion.  Neurological:     General: No focal deficit present.     Mental Status: She is alert and oriented to person, place, and time.  Skin:    General: Skin is warm and dry.  Psychiatric:        Mood and Affect: Mood normal.        Behavior: Behavior normal.  Vitals reviewed.     Female chaperone was present for the entirety of the pelvic exam  Assessment: 22 y.o. year old female status post prior Mirena IUD placement 4 week ago, doing well.  Plan: 1.  The patient was given instructions to check her IUD strings monthly and call with any problems or concerns.  She should call for fevers, chills, abnormal vaginal discharge, pelvic pain, or other complaints. 2.  She will return for a annual exam in 1 year.  All questions answered.  20 minutes spent in face to face discussion with > 50% spent in counseling, management, and coordination of care for her newly-placed IUD.  Risks and benefits of IUD  discussed including the risks of irregular bleeding, cramping, infection, malpositioning, expulsion, which may require further procedures such as laparoscopy.  IUDs while effective at preventing pregnancy do not prevent transmission of sexually transmitted diseases and use of barrier methods for this purpose was discussed.  Low overall incidence of failure with 99.7% efficacy rate in typical use.    Mirna Mires, CNM  03/16/2023 4:34 PM   03/16/2023 4:24 PM

## 2023-07-15 LAB — T4, FREE: Free T4: 0.84 ng/dL (ref 0.82–1.77)

## 2023-07-15 LAB — T3, FREE: T3, Free: 3.1 pg/mL (ref 2.0–4.4)

## 2023-07-15 LAB — TSH: TSH: 3.56 u[IU]/mL (ref 0.450–4.500)

## 2024-01-10 NOTE — ED Triage Notes (Addendum)
 Pt reports she noticed two growths inside vagina, vaginal discharge, pressure, and irregular bleeding. Pt also endorses abdominal cramping and IUD. Denies N/V. Pt reports she has an IUD that she has for 10 months with no complications.

## 2024-01-10 NOTE — ED Triage Notes (Signed)
 Pt ambulatory into triage with complaint of two large knots on the inside of her labia that is 'pushing my vagina open.' Pt has green discharge with irregular periods and spotting. Pt with IUD. Denies sexual activity since April.

## 2024-01-11 ENCOUNTER — Telehealth: Payer: Self-pay

## 2024-01-11 NOTE — Telephone Encounter (Signed)
 Patient was seen at [facility] ED for evaluation last night.

## 2024-01-27 ENCOUNTER — Encounter: Payer: Self-pay | Admitting: Advanced Practice Midwife

## 2024-01-27 ENCOUNTER — Other Ambulatory Visit (HOSPITAL_COMMUNITY)
Admission: RE | Admit: 2024-01-27 | Discharge: 2024-01-27 | Disposition: A | Discharge status: Home / Self Care | Source: Ambulatory Visit | Attending: Advanced Practice Midwife | Admitting: Advanced Practice Midwife

## 2024-01-27 ENCOUNTER — Ambulatory Visit (INDEPENDENT_AMBULATORY_CARE_PROVIDER_SITE_OTHER): Admitting: Advanced Practice Midwife

## 2024-01-27 VITALS — BP 117/65 | HR 83 | Ht 68.0 in | Wt 182.0 lb

## 2024-01-27 DIAGNOSIS — Z113 Encounter for screening for infections with a predominantly sexual mode of transmission: Secondary | ICD-10-CM | POA: Insufficient documentation

## 2024-01-27 DIAGNOSIS — N898 Other specified noninflammatory disorders of vagina: Secondary | ICD-10-CM

## 2024-01-27 DIAGNOSIS — N759 Disease of Bartholin's gland, unspecified: Secondary | ICD-10-CM

## 2024-01-27 NOTE — Progress Notes (Signed)
 Patient ID: Sara Henson, female   DOB: February 15, 2001, 23 y.o.   MRN: [id number]  Reason for Visit: Vaginal Exam (Two inside knots that look like a bulge.)   Subjective:  Date of Service: 01/27/2024  HPI:  Sara Henson is a 23 y.o. female being seen for concern of what she describes as two knots in vagina like a bulge. She first noticed the tissue on 9/10 and was seen at [facility] ER. Per their assessment the lesions appear to be extra vaginal tissue. The patient reports the knots were not there a few months ago. She has Mirena  IUD since 02/02/23 and has irregular bleeding/spotting. Had green vaginal discharge at the time of [facility] visit. Vaginitis and STD labs done on 9/10 were normal. Last PAP was normal postpartum in 2023. She requests STD testing today. She has not used protection with recent sexual activity. Reports not needing it one time and nothing involving vagina the other time.  She is worried about the knots and thinks it could be an abnormal growth. Her fears partly stem from her history of thyrotoxicosis needing thyroid  lobectomy. She reports it took a long time to figure out the problem with her thyroid .   Reassurance given that tissue appears to be a benign normal variation of vaginal opening. If it is related to Bartholin's gland, swelling could increase and become more painful. She is encouraged to return in a month or two if there is worsening or no improvement. Discussed findings with Dr Starla who will be happy to see her at next visit.   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   Had inpatient treatment at [facility] Sees perinatal pysch   Family History  Problem Relation Age of Onset   Skin cancer Father    Past Surgical History:  Procedure Laterality Date   CESAREAN SECTION N/A 09/28/2019   Procedure: CESAREAN  SECTION;  Surgeon: Lake Read, MD;  Location: [facility] ORS;  Service: Obstetrics;  Laterality: N/A;  [id number] apgar-8,9 weight 9lbs 11oz   COLPOSCOPY     OTHER SURGICAL HISTORY  10/2022   Thyroid  removal (left lobe)   TONSILLECTOMY     TYMPANOSTOMY TUBE PLACEMENT      Short Social History:  Social History   Tobacco Use   Smoking status: Never   Smokeless tobacco: Current    Types: Chew  Substance Use Topics   Alcohol use: Yes    Comment: soc    No Known Allergies  Current Outpatient Medications  Medication Sig Dispense Refill   levonorgestrel  (MIRENA ) 20 MCG/DAY IUD 1 each by Intrauterine route once.     No current facility-administered medications for this visit.    Review of Systems  Constitutional:  Negative for chills and fever.  HENT:  Negative for congestion, ear discharge, ear pain, hearing loss, sinus pain and sore throat.   Eyes:  Negative for blurred vision and double vision.  Respiratory:  Negative for cough, shortness of breath and wheezing.   Cardiovascular:  Negative for chest pain, palpitations and leg swelling.  Gastrointestinal:  Negative for abdominal pain, blood in stool, constipation, diarrhea, heartburn, melena, nausea and vomiting.  Genitourinary:  Negative for dysuria, flank pain, frequency, hematuria and urgency.       Positive for knots at vaginal opening  Musculoskeletal:  Negative for back pain, joint pain  and myalgias.  Skin:  Negative for itching and rash.  Neurological:  Negative for dizziness, tingling, tremors, sensory change, speech change, focal weakness, seizures, loss of consciousness, weakness and headaches.  Endo/Heme/Allergies:  Negative for environmental allergies. Does not bruise/bleed easily.  Psychiatric/Behavioral:  Negative for depression, hallucinations, memory loss, substance abuse and suicidal ideas. The patient is not nervous/anxious and does not have insomnia.        Objective:  Objective   Vitals:    01/27/24 1558  BP: 117/65  Pulse: 83  Weight: 182 lb (82.6 kg)  Height: 5' 8 (1.727 m)   Body mass index is 27.67 kg/m. Constitutional: Well nourished, well developed female in mild acute emotional distress.  HEENT: normal Skin: Warm and dry.  Cardiovascular: Regular rate and rhythm.   Extremity: no edema Respiratory:  Normal respiratory effort Psych: Alert and Oriented x3. No memory deficits. Normal mood and affect.    Pelvic exam:  is not limited by body habitus EGBUS: protruding tissue approximately 3 mm at introitus, bilateral, slightly below midway, pink, soft, non-tender, no sign of infection Vagina: within normal limits and with normal mucosa, aptima swab collected   Assessment/Plan:     23 y.o. G3 P15 female with likely normal hymenal or vaginal tissue variation or possibly swelling from Bartholin's duct  Soak in tub with sea salt as needed Follow up as needed after labs result Return to see Dr Starla if condition worsens   Slater Rains, CNM [facility] 01/30/2024 10:36 PM

## 2024-01-30 ENCOUNTER — Encounter: Payer: Self-pay | Admitting: Advanced Practice Midwife

## 2024-01-31 LAB — CERVICOVAGINAL ANCILLARY ONLY
Chlamydia: NEGATIVE
Comment: NEGATIVE
Comment: NEGATIVE
Comment: NORMAL
Neisseria Gonorrhea: NEGATIVE
Trichomonas: NEGATIVE

## 2024-02-03 ENCOUNTER — Other Ambulatory Visit
Admission: RE | Admit: 2024-02-03 | Discharge: 2024-02-03 | Disposition: A | Discharge status: Home / Self Care | Attending: Advanced Practice Midwife | Admitting: Advanced Practice Midwife

## 2024-02-03 DIAGNOSIS — Z113 Encounter for screening for infections with a predominantly sexual mode of transmission: Secondary | ICD-10-CM | POA: Diagnosis present

## 2024-02-03 LAB — HIV ANTIBODY (ROUTINE TESTING W REFLEX): HIV Screen 4th Generation wRfx: NONREACTIVE

## 2024-02-03 LAB — HEPATITIS C ANTIBODY: HCV Ab: NONREACTIVE

## 2024-02-04 LAB — MISC LABCORP TEST (SEND OUT): Labcorp test code: 63959

## 2024-02-04 LAB — RPR: RPR Ser Ql: NONREACTIVE

## 2024-02-10 ENCOUNTER — Ambulatory Visit: Payer: Self-pay | Admitting: Advanced Practice Midwife

## 2024-03-18 ENCOUNTER — Ambulatory Visit: Admission: EM | Admit: 2024-03-18 | Discharge: 2024-03-18 | Disposition: A | Discharge status: Home / Self Care

## 2024-03-18 DIAGNOSIS — R202 Paresthesia of skin: Secondary | ICD-10-CM

## 2024-03-18 DIAGNOSIS — E890 Postprocedural hypothyroidism: Secondary | ICD-10-CM

## 2024-03-18 DIAGNOSIS — R200 Anesthesia of skin: Secondary | ICD-10-CM

## 2024-03-18 NOTE — Discharge Instructions (Signed)
 Go to the ER for further evaluation and management of your symptoms.

## 2024-03-18 NOTE — ED Provider Notes (Signed)
 [facility]    CSN: [id number] Arrival date & time: 03/18/24  1031      History   Chief Complaint Chief Complaint  Patient presents with   leg tingling     HPI Sara Henson is a 23 y.o. female.   Patient presents today with 1 week history of bilateral lower extremity tingling and feeling shaky in her legs.  Reports left leg is now numb in certain areas and feels like a pressure.  She also reports her hands have been shaking a little bit.  No recent fall, trauma, or injury to her spine.  No new urinary incontinence or bowel incontinence.  No recent fever or illness.  She endorses constipation and she is under a lot of stress.  Reports history of partial thyroidectomy 07/2023, had blood work that was normal immediately after and has not followed up since.  Reports she cannot get in with endocrinologist until February.  Does not currently have a primary care provider.    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   History of eating disorder 10/07/2021   History of postpartum depression 12/26/2020   Recurrent major depressive disorder 11/25/2020   PMDD (premenstrual dysphoric disorder) 10/06/2020   IUD (intrauterine device) in place 04/02/2020   Thyroid  nodule 08/22/2019   Subclinical hypothyroidism 08/14/2019   GERD without esophagitis 04/30/2016   Murmur 02/08/2012    Past Surgical History:  Procedure Laterality Date   CESAREAN SECTION N/A 09/28/2019   Procedure: CESAREAN SECTION;  Surgeon: Lake Read, MD;  Location: [facility] ORS;  Service: Obstetrics;  Laterality: N/A;  dzk:qzfjoz [id number] apgar-8,9 weight 9lbs 11oz   COLPOSCOPY     OTHER  SURGICAL HISTORY  10/2022   Thyroid  removal (left lobe)   THYROIDECTOMY, PARTIAL     TONSILLECTOMY     TYMPANOSTOMY TUBE PLACEMENT      OB History     Gravida  3   Para  2   Term  2   Preterm      AB  1   Living  2      SAB  1   IAB      Ectopic      Multiple  0   Live Births  2            Home Medications    Prior to Admission medications   Medication Sig Start Date End Date Taking? Authorizing Provider  levonorgestrel  (MIRENA ) 20 MCG/DAY IUD 1 each by Intrauterine route once. 02/02/23  Yes [provider]    Family History Family History  Problem Relation Age of Onset   Skin cancer Father     Social History Social History   Tobacco Use   Smoking status: Never   Smokeless tobacco: Current    Types: Chew  Vaping Use   Vaping status: Former   Devices: quit 2020  Substance Use Topics   Alcohol use: Yes    Comment: soc   Drug use: No     Allergies   Patient has no known allergies.   Review of Systems Review of Systems Per HPI  Physical Exam Triage Vital Signs ED Triage Vitals  Encounter Vitals  Group     BP 03/18/24 1049 124/83     Girls Systolic BP Percentile --      Girls Diastolic BP Percentile --      Boys Systolic BP Percentile --      Boys Diastolic BP Percentile --      Pulse Rate 03/18/24 1049 69     Resp 03/18/24 1049 18     Temp 03/18/24 1049 98.2 F (36.8 C)     Temp Source 03/18/24 1049 Oral     SpO2 03/18/24 1049 99 %     Weight 03/18/24 1049 180 lb (81.6 kg)     Height --      Head Circumference --      Peak Flow --      Pain Score 03/18/24 1047 0     Pain Loc --      Pain Education --      Exclude from Growth Chart --    No data found.  Updated Vital Signs BP 124/83 (BP Location: Right Arm)   Pulse 69   Temp 98.2 F (36.8 C) (Oral)   Resp 18   Wt 180 lb (81.6 kg)   SpO2 99%   BMI 27.37 kg/m   Visual Acuity Right Eye Distance:   Left Eye Distance:   Bilateral Distance:    Right Eye  Near:   Left Eye Near:    Bilateral Near:     Physical Exam Vitals and nursing note reviewed.  Constitutional:      General: She is not in acute distress.    Appearance: Normal appearance. She is not toxic-appearing.  HENT:     Head: Normocephalic and atraumatic.     Right Ear: External ear normal.     Left Ear: External ear normal.     Mouth/Throat:     Mouth: Mucous membranes are moist.     Pharynx: Oropharynx is clear.  Cardiovascular:     Rate and Rhythm: Normal rate.  Pulmonary:     Effort: Pulmonary effort is normal. No respiratory distress.  Musculoskeletal:     Cervical back: Normal range of motion. No rigidity.  Lymphadenopathy:     Cervical: No cervical adenopathy.  Skin:    General: Skin is warm and dry.     Capillary Refill: Capillary refill takes less than 2 seconds.     Coloration: Skin is not jaundiced or pale.     Findings: No erythema.     Comments: Inspection: no swelling, bruising, obvious deformity or redness to bilateral lower extremities and bilateral upper extremities Palpation: Upper and lower extremities are nontender to palpation; no obvious deformities palpated ROM: Full ROM to bilateral upper and lower extremities Strength: 5/5 bilateral upper and lower extremities Neurovascular: neurovascularly intact in distal bilateral upper and lower extremities  Neurological:     Mental Status: She is alert and oriented to person, place, and time.  Psychiatric:        Mood and Affect: Mood is anxious.      UC Treatments / Results  Labs (all labs ordered are listed, but only abnormal results are displayed) Labs Reviewed - No data to display  EKG   Radiology No results found.  Procedures Procedures (including critical care time)  Medications Ordered in UC Medications - No data to display  Initial Impression / Assessment and Plan / UC Course  I have reviewed the triage vital signs and the nursing notes.  Pertinent labs & imaging results that  were available  during my care of the patient were reviewed by me and considered in my medical decision making (see chart for details).   Patient is well-appearing, normotensive, afebrile, not tachycardic, not tachypneic, oxygenating well on room air.   1. Numbness and tingling of left lower extremity 2. History of thyroidectomy, subtotal No red flags Discussed blood work obtained today would not yield results today Given patient's desire to have blood work results today to rule out metabolic cause, recommended further evaluation and management in the ER Patient is in agreement to plan and is safe to transport via private vehicle at this time   The patient was given the opportunity to ask questions.  All questions answered to their satisfaction.  The patient is in agreement to this plan.   Final Clinical Impressions(s) / UC Diagnoses   Final diagnoses:  Numbness and tingling of left lower extremity  History of thyroidectomy, subtotal     Discharge Instructions      Go to the ER for further evaluation and management of your symptoms.    ED Prescriptions   None    PDMP not reviewed this encounter.   Chandra Harlene LABOR, NP 03/18/24 1154

## 2024-03-18 NOTE — ED Triage Notes (Signed)
 Patient states that Monday her both legs started tingling and shaky. Left leg felt like it was falling asleep.numbness and tingling in her legs. Hands shaking. Constipation. Patient states that legs are started to feel heaving.  Patient contacted her PCP that told her she can't see her until feb. Patient had a partial thyroidectomy in march.

## 2024-03-18 NOTE — ED Notes (Signed)
 Patient is being discharged from the Urgent Care and sent to the Emergency Department via private vehicle with friend . Per Harlene Code, NP, patient is in need of higher level of care due to Tingling in legs and wanting Blood tests. Patient is aware and verbalizes understanding of plan of care.  Vitals:   03/18/24 1049  BP: 124/83  Pulse: 69  Resp: 18  Temp: 98.2 F (36.8 C)  SpO2: 99%
